# Patient Record
Sex: Female | Born: 1955
Health system: Southern US, Community
[De-identification: ages and names within clinical notes are randomized; demographics above are authoritative.]

## PROBLEM LIST (undated history)

## (undated) DIAGNOSIS — K219 Gastro-esophageal reflux disease without esophagitis: Secondary | ICD-10-CM

## (undated) DIAGNOSIS — J302 Other seasonal allergic rhinitis: Secondary | ICD-10-CM

## (undated) DIAGNOSIS — E042 Nontoxic multinodular goiter: Secondary | ICD-10-CM

## (undated) DIAGNOSIS — F329 Major depressive disorder, single episode, unspecified: Secondary | ICD-10-CM

## (undated) DIAGNOSIS — Z8489 Family history of other specified conditions: Secondary | ICD-10-CM

## (undated) DIAGNOSIS — R42 Dizziness and giddiness: Secondary | ICD-10-CM

## (undated) DIAGNOSIS — I10 Essential (primary) hypertension: Secondary | ICD-10-CM

## (undated) DIAGNOSIS — L509 Urticaria, unspecified: Secondary | ICD-10-CM

## (undated) DIAGNOSIS — F411 Generalized anxiety disorder: Secondary | ICD-10-CM

## (undated) DIAGNOSIS — I209 Angina pectoris, unspecified: Secondary | ICD-10-CM

## (undated) DIAGNOSIS — F32A Depression, unspecified: Secondary | ICD-10-CM

## (undated) HISTORY — DX: Urticaria, unspecified: L50.9

## (undated) HISTORY — DX: Essential (primary) hypertension: I10

## (undated) HISTORY — DX: Nontoxic multinodular goiter: E04.2

## (undated) HISTORY — DX: Generalized anxiety disorder: F41.1

## (undated) HISTORY — PX: APPENDECTOMY: SHX54

## (undated) HISTORY — DX: Gastro-esophageal reflux disease without esophagitis: K21.9

## (undated) HISTORY — DX: Other seasonal allergic rhinitis: J30.2

## (undated) HISTORY — DX: Depression, unspecified: F32.A

---
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
## 1998-05-20 ENCOUNTER — Encounter: Payer: Self-pay | Admitting: Emergency Medicine

## 1998-05-20 ENCOUNTER — Emergency Department (HOSPITAL_COMMUNITY): Admission: EM | Admit: 1998-05-20 | Discharge: 1998-05-20 | Payer: Self-pay | Admitting: Emergency Medicine

## 1999-07-22 ENCOUNTER — Other Ambulatory Visit: Admission: RE | Admit: 1999-07-22 | Discharge: 1999-07-22 | Payer: Self-pay | Admitting: Gynecology

## 2000-02-05 ENCOUNTER — Other Ambulatory Visit: Admission: RE | Admit: 2000-02-05 | Discharge: 2000-02-05 | Payer: Self-pay | Admitting: *Deleted

## 2001-02-23 ENCOUNTER — Other Ambulatory Visit: Admission: RE | Admit: 2001-02-23 | Discharge: 2001-02-23 | Payer: Self-pay | Admitting: *Deleted

## 2002-07-18 ENCOUNTER — Other Ambulatory Visit: Admission: RE | Admit: 2002-07-18 | Discharge: 2002-07-18 | Payer: Self-pay | Admitting: *Deleted

## 2004-04-10 ENCOUNTER — Encounter: Admission: RE | Admit: 2004-04-10 | Discharge: 2004-04-10 | Payer: Self-pay | Admitting: Family Medicine

## 2004-04-10 IMAGING — CT CT HEAD WO/W CM
1 of 2 series · 13 of 30 positions shown, 17 images · IV contrast (75 ML OMNI 300)
Comparison: none

CLINICAL DATA: Headaches. 
 COMPUTERIZED CRANIAL TOMOGRAPHY WITH AND WITHOUT CONTRAST:
 Scans were performed before and after intravenous administration of 75 cc of Omnipaque.  
 There are scattered tiny areas of lucency in the white matter of both cerebral hemispheres.  The brain otherwise appears normal.  No pathologic enhancement after intravenous contrast administration.  Visualized bony structures are within normal limits.

[Series 2: brain · axial · 0.49mm/px · z∈[+34,+155]mm · 13 of 28 slices shown, 17 images]
[im 2/28  brain]
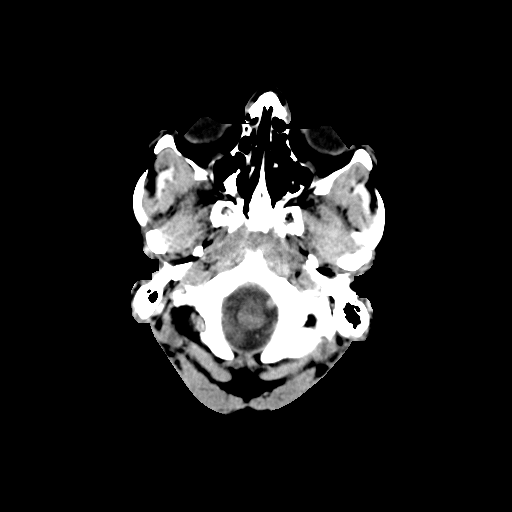
[im 2/28  bone]
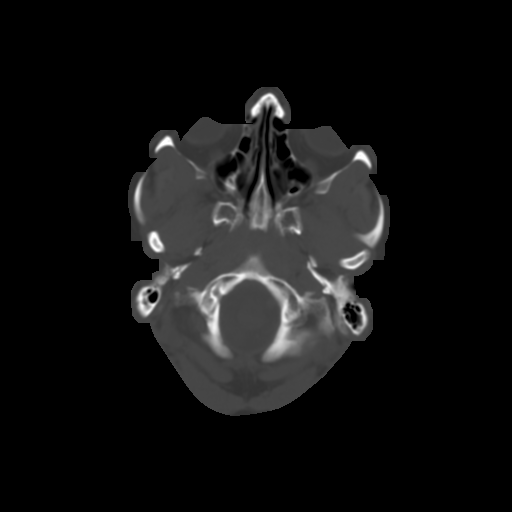
[im 4/28  brain]
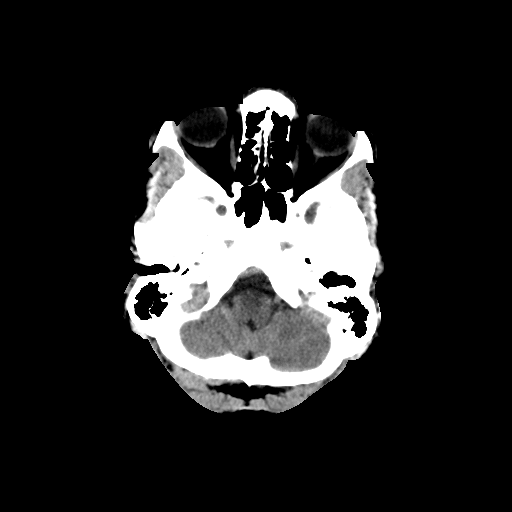
[im 6/28  brain]
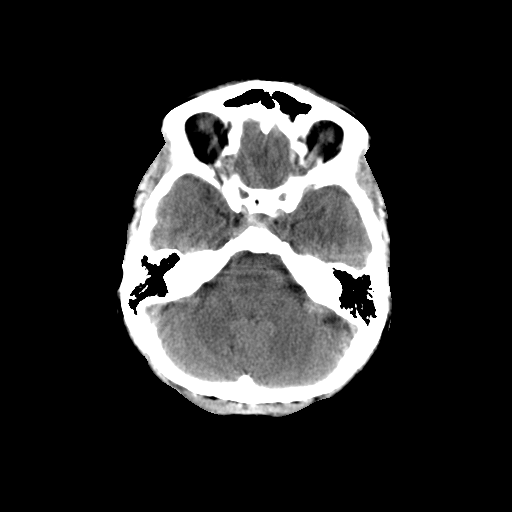
[im 8/28  brain]
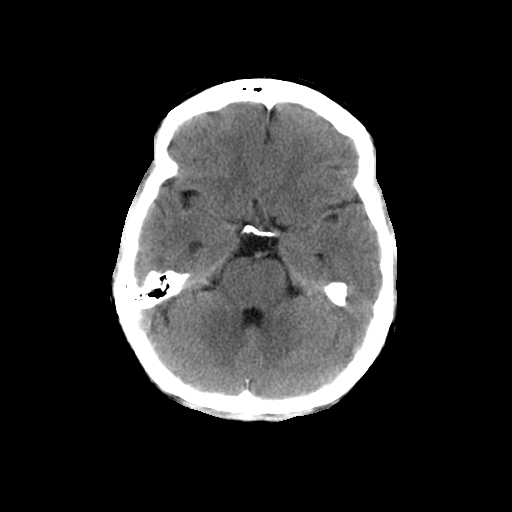
[im 10/28  brain]
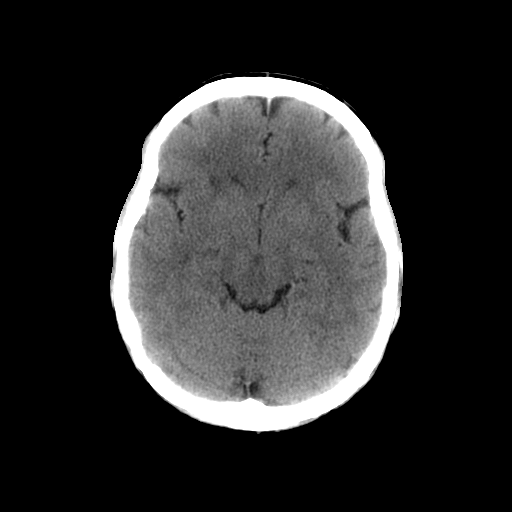
[im 10/28  bone]
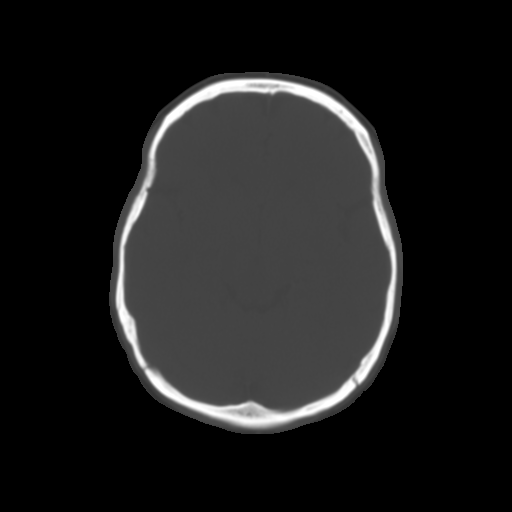
[im 12/28  brain]
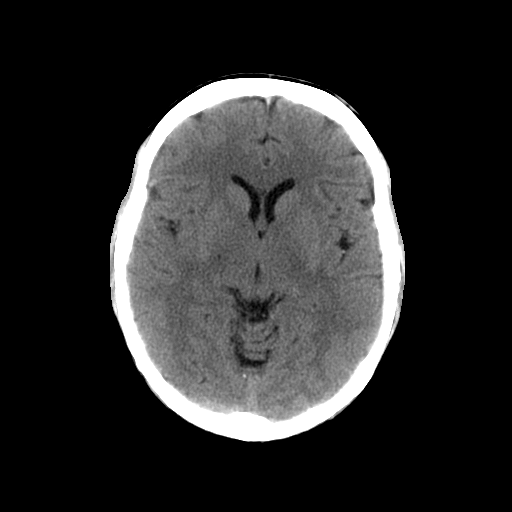
[im 14/28  brain]
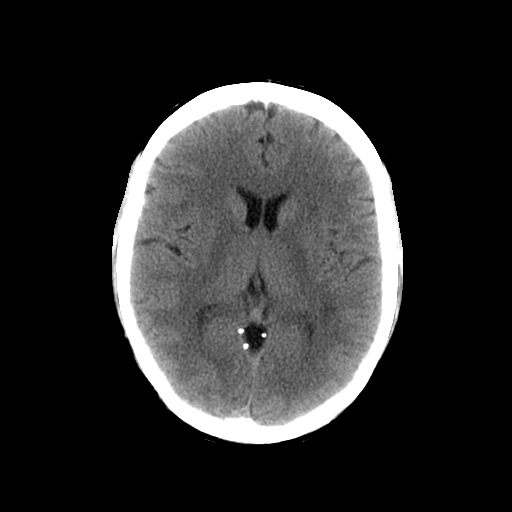
[im 16/28  brain]
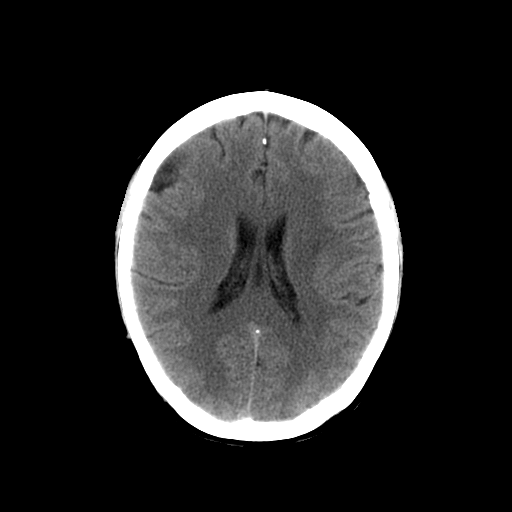
[im 18/28  brain]
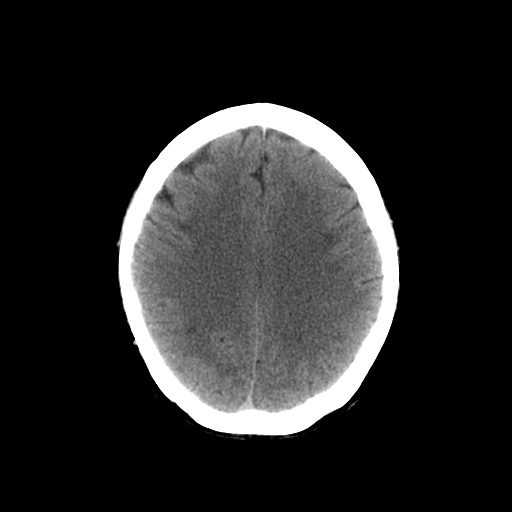
[im 18/28  bone]
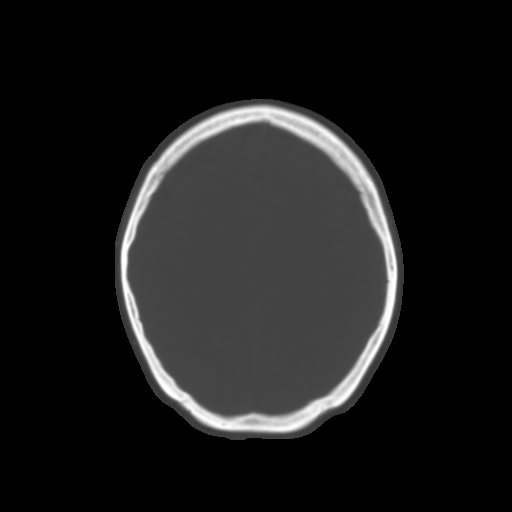
[im 20/28  brain]
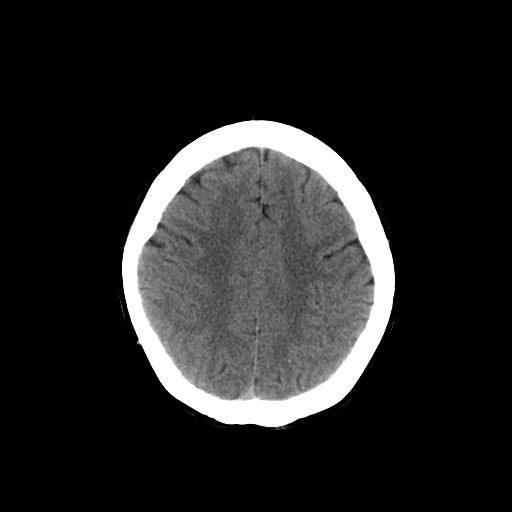
[im 22/28  brain]
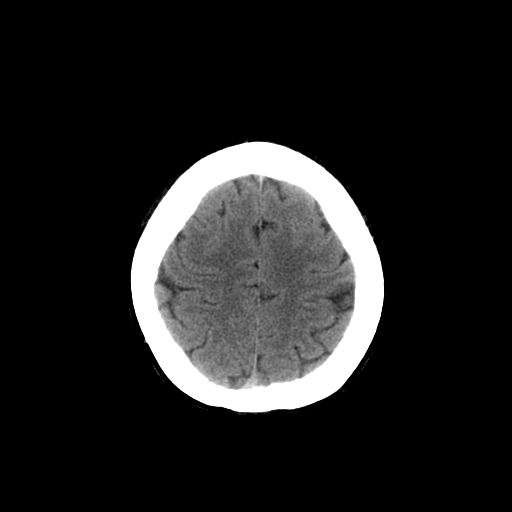
[im 24/28  brain]
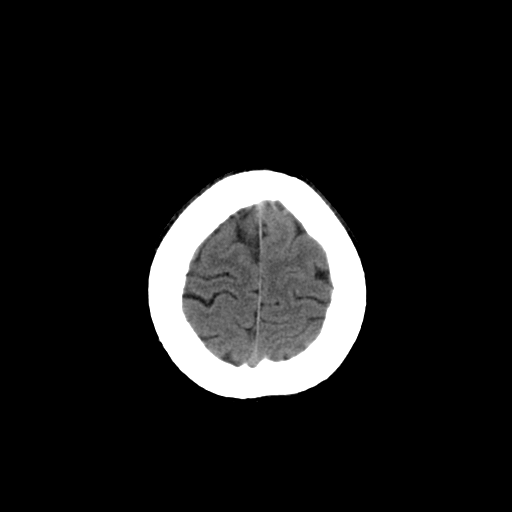
[im 26/28  brain]
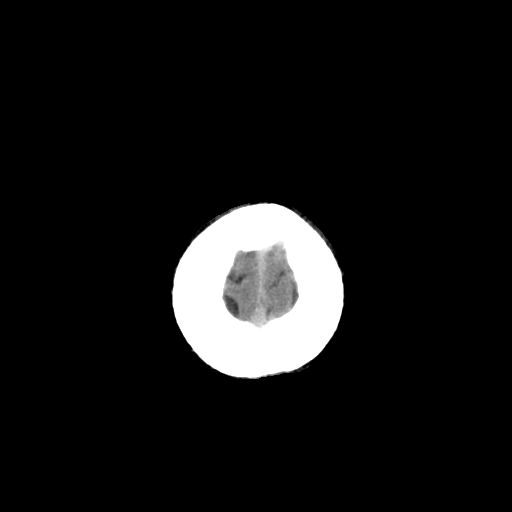
[im 26/28  bone]
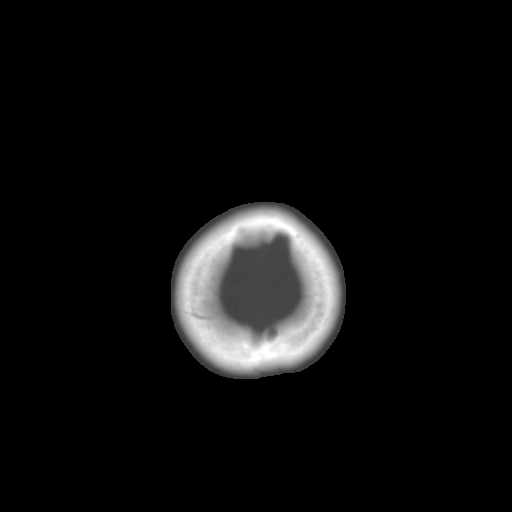

[13 of 30 positions shown; findings below may reference images not displayed]

IMPRESSION: No acute abnormality.  Mild chronic small vessel ischemic changes.

## 2004-06-12 ENCOUNTER — Other Ambulatory Visit: Admission: RE | Admit: 2004-06-12 | Discharge: 2004-06-12 | Payer: Self-pay | Admitting: Obstetrics and Gynecology

## 2005-09-14 LAB — HM DEXA SCAN

## 2005-12-21 ENCOUNTER — Emergency Department (HOSPITAL_COMMUNITY): Admission: EM | Admit: 2005-12-21 | Discharge: 2005-12-21 | Payer: Self-pay | Admitting: Emergency Medicine

## 2006-03-07 ENCOUNTER — Inpatient Hospital Stay (HOSPITAL_COMMUNITY): Admission: EM | Admit: 2006-03-07 | Discharge: 2006-03-08 | Payer: Self-pay | Admitting: *Deleted

## 2006-03-07 IMAGING — CR DG CHEST 2V
2 series · 2 of 2 positions shown · non-contrast
Comparison: none

CLINICAL DATA: 49-year-old with right-sided pain.  Preop evaluation.
 CHEST - 2 VIEW:

[w chest pa]
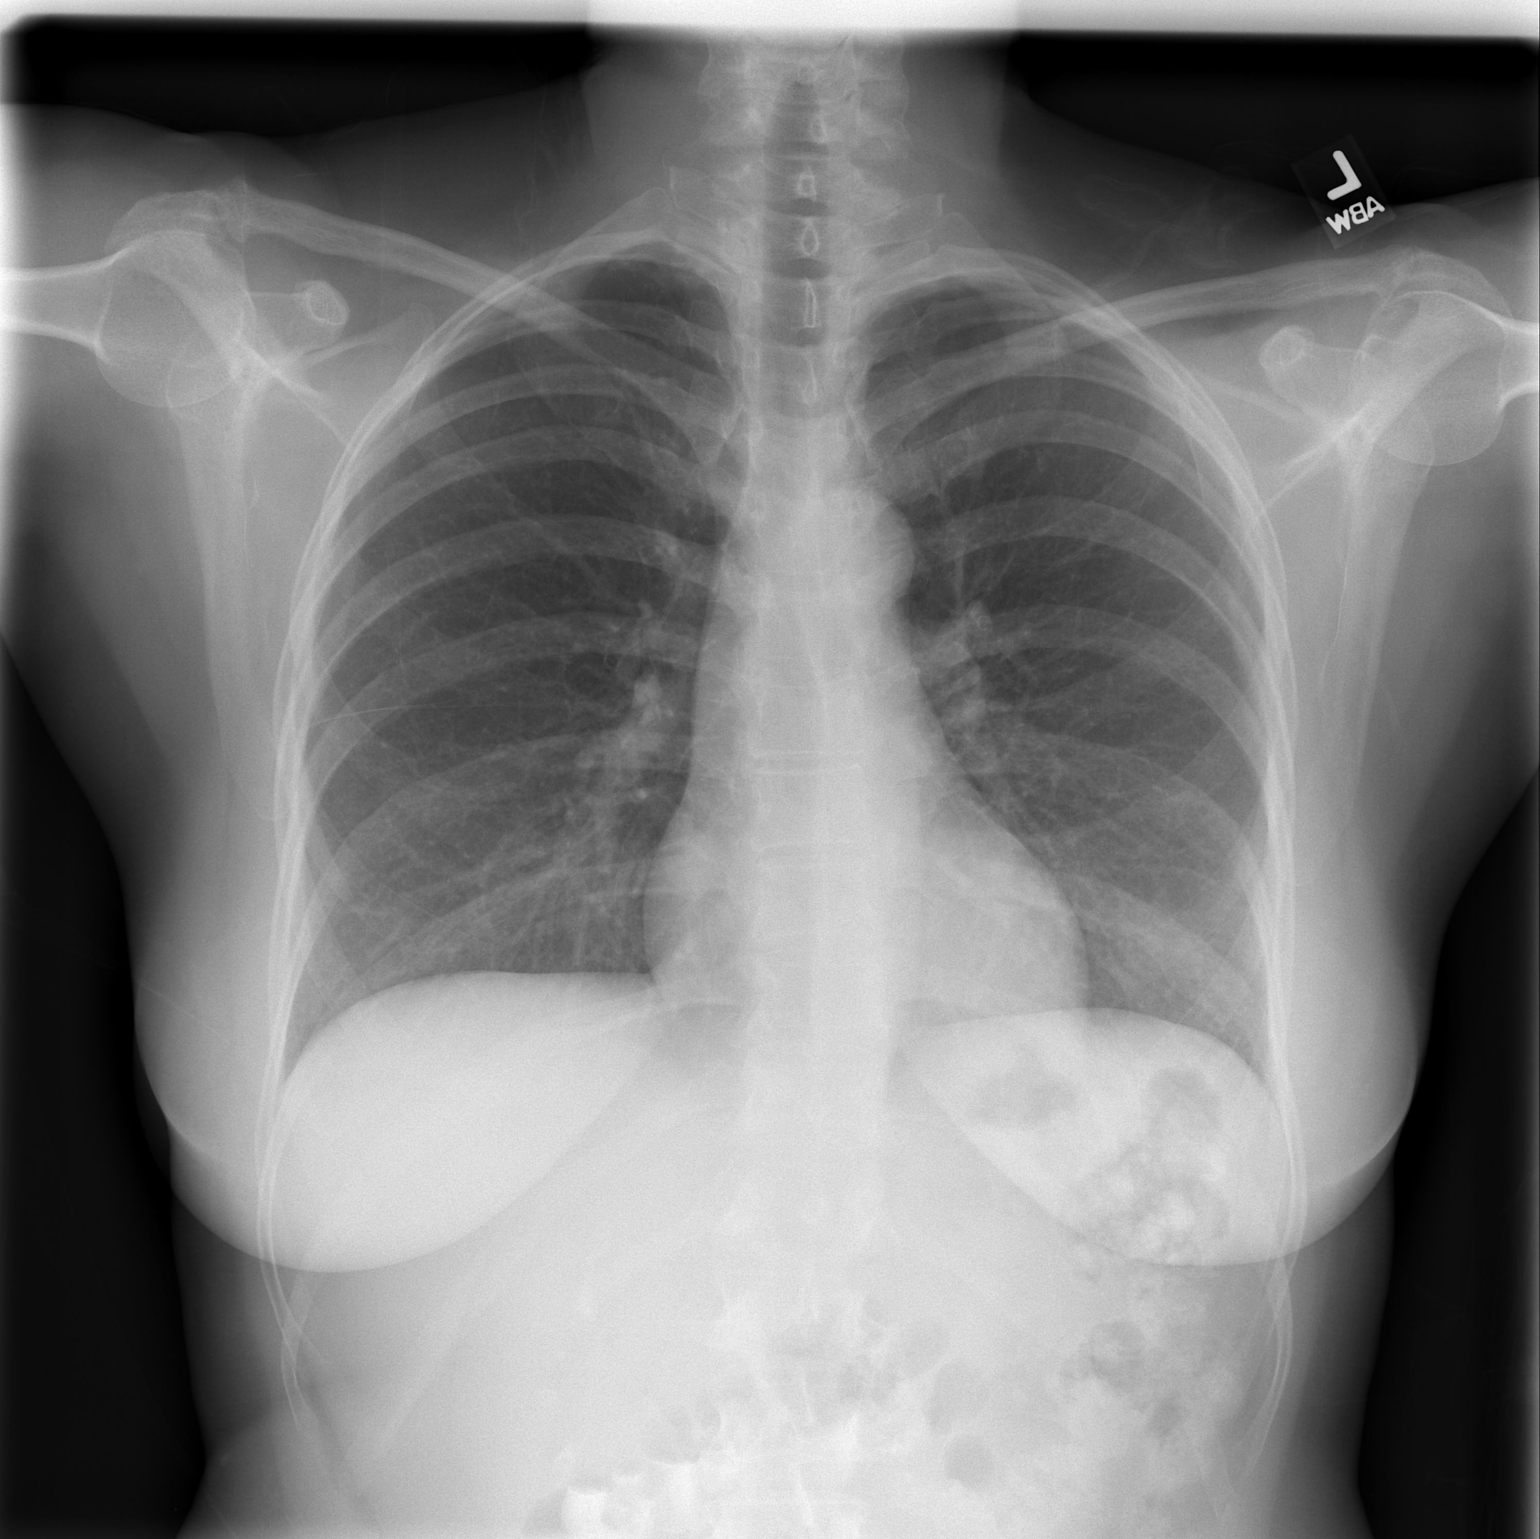

[w chest lat]
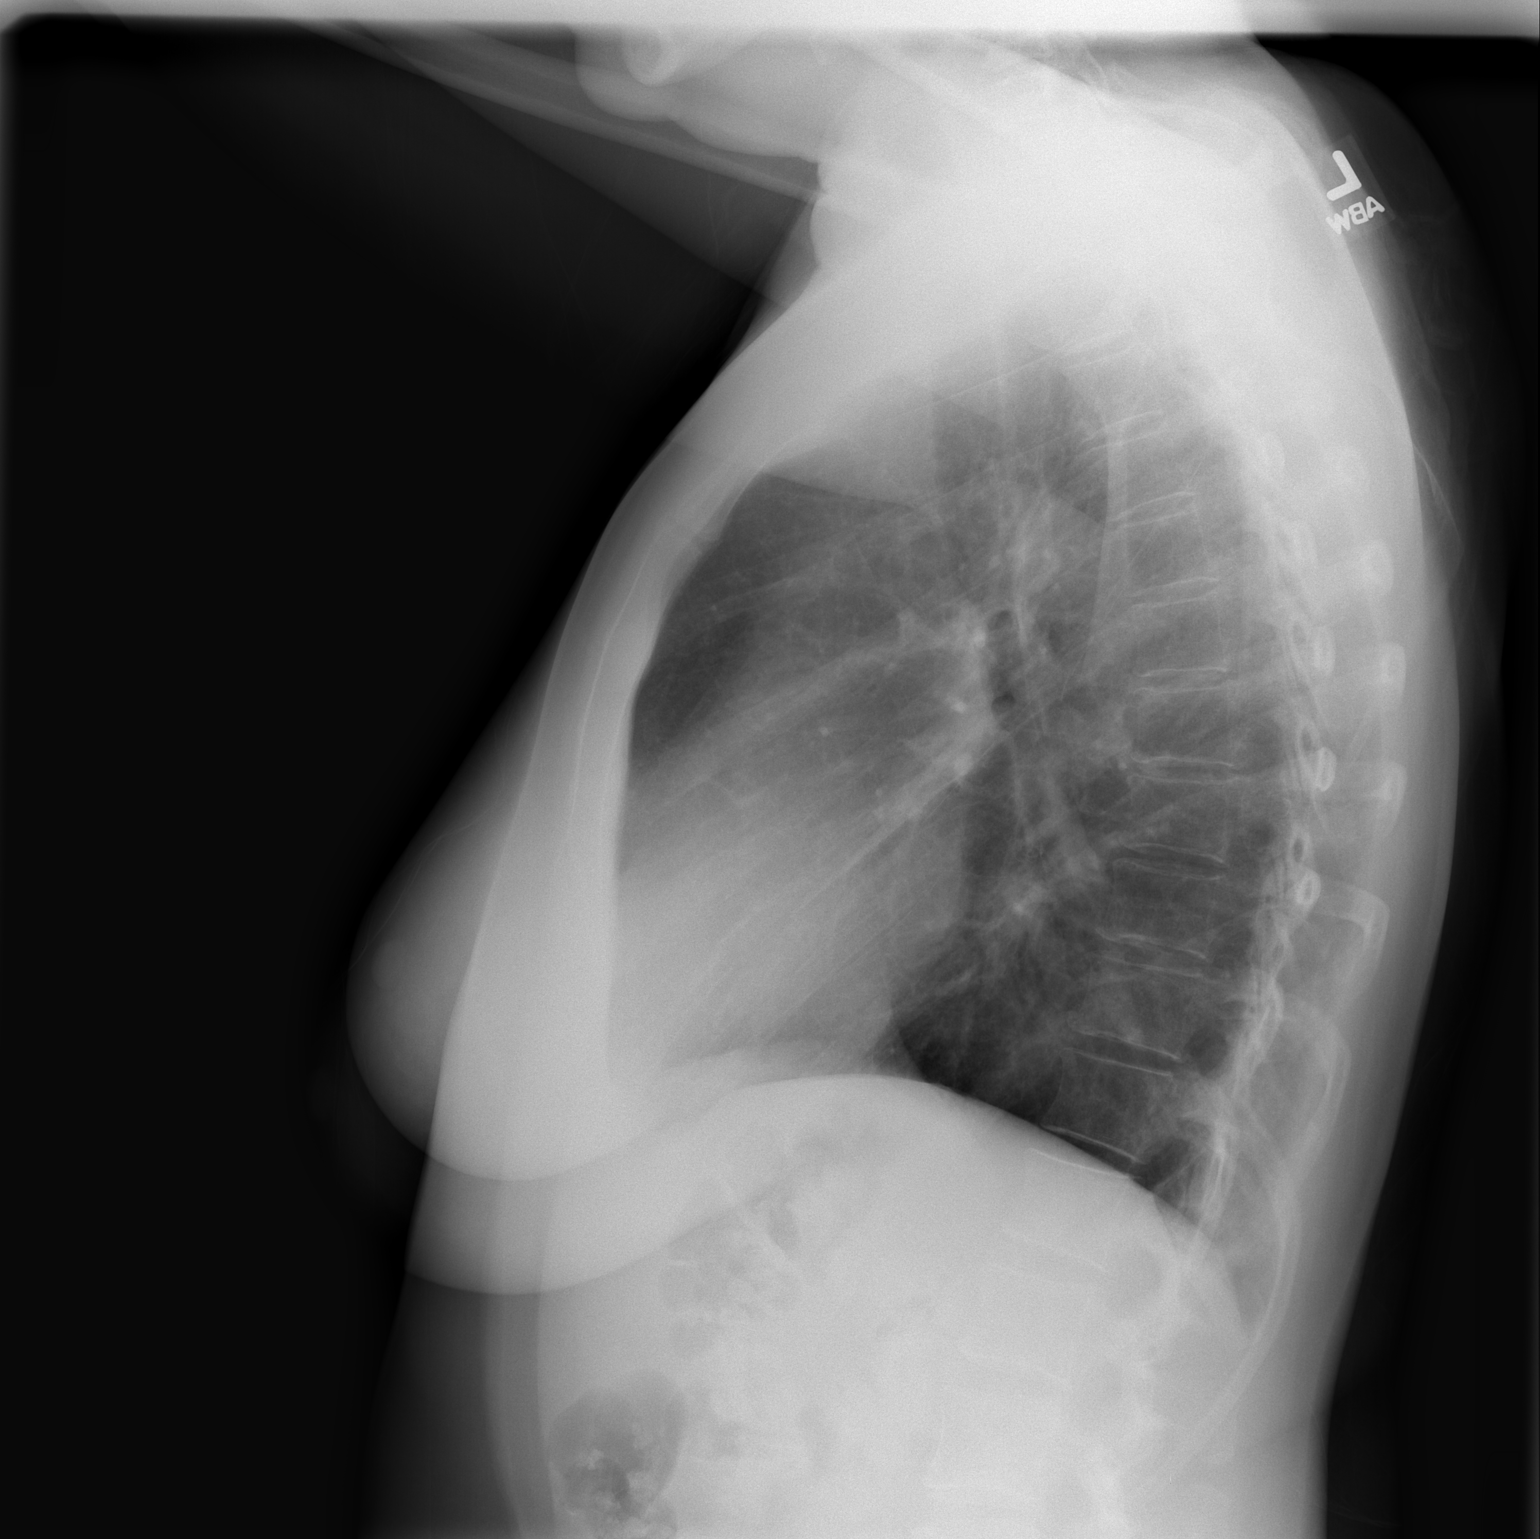

[2 of 2 positions shown; findings below may reference images not displayed]

FINDINGS: Two views of the chest demonstrate clear lungs.  Heart and mediastinum are within normal limits.  Bone structures are intact.  No evidence of pneumothorax.
IMPRESSION: No acute cardiopulmonary disease.

## 2006-03-07 IMAGING — CT CT ABDOMEN W/ CM
2 of 5 series · 16 of 46 positions shown, 18 images · IV contrast (APPLIED)
Comparison: Report from prior CT scan dated [DATE]

ABDOMEN CT WITH CONTRAST

CLINICAL DATA: Right-sided abdominal pain.
TECHNIQUE: Multidetector CT imaging of the abdomen and pelvis was performed
following the standard protocol during bolus administration of intravenous
contrast.

Contrast:  125 cc Omnipaque 300

[Series 2: abd_pel 5.0 b40f st · axial · 0.56mm/px · z∈[-387,-27]mm · 13 of 81 slices shown, 15 images]
[im 5/81  soft-tissue]
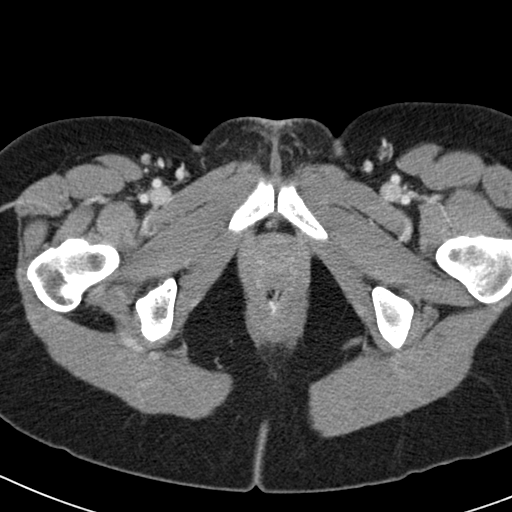
[im 5/81  bone]
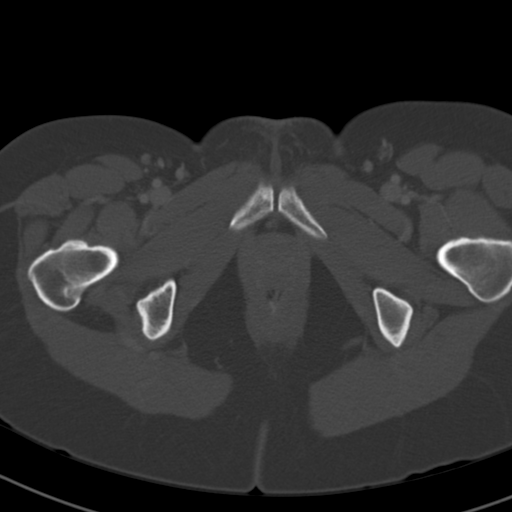
[im 13/81  soft-tissue]
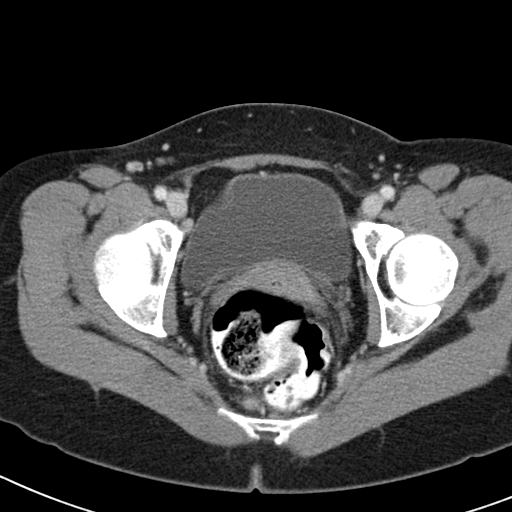
[im 17/81  soft-tissue]
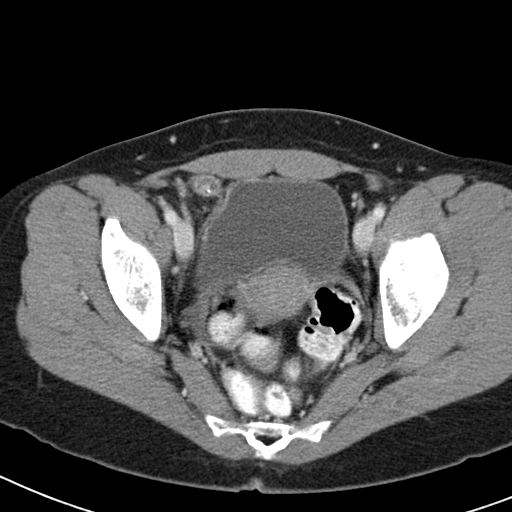
[im 25/81  soft-tissue]
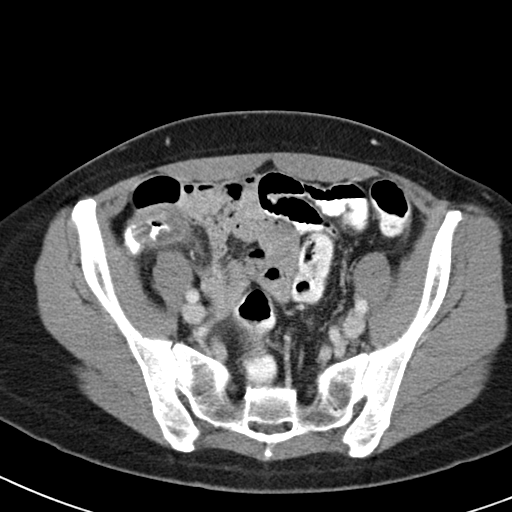
[im 29/81  soft-tissue]
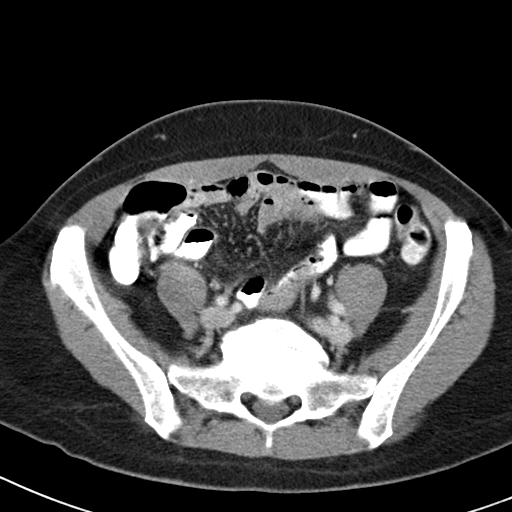
[im 37/81  soft-tissue]
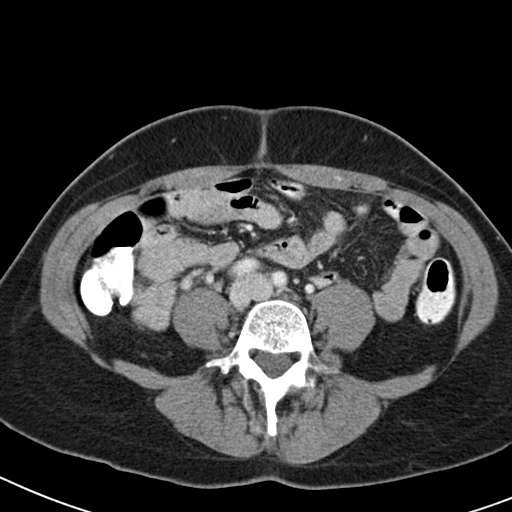
[im 41/81  soft-tissue]
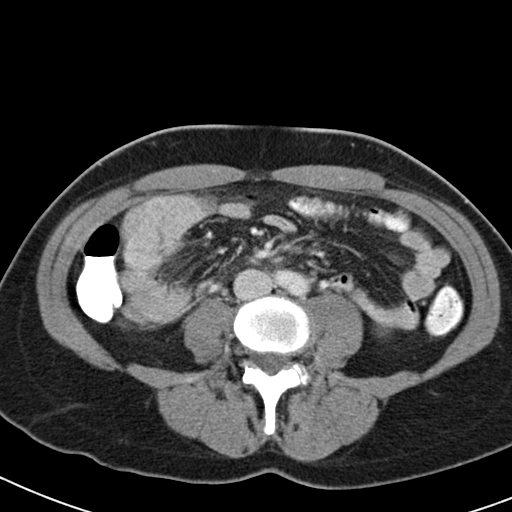
[im 45/81  soft-tissue]
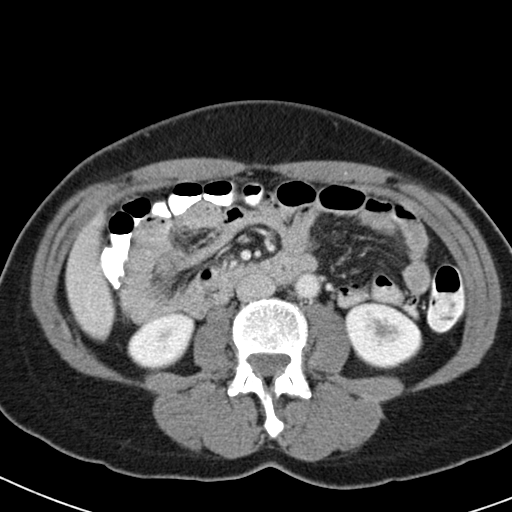
[im 53/81  soft-tissue]
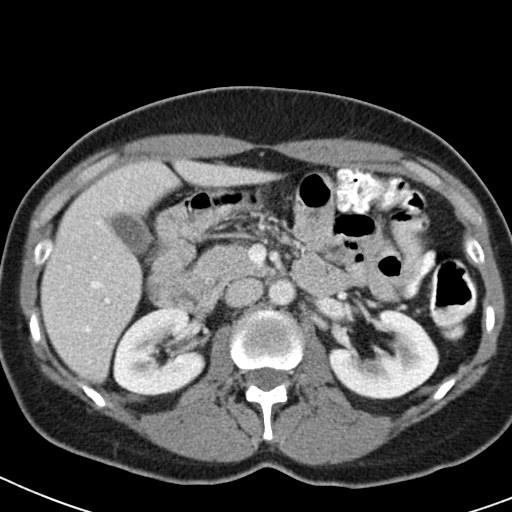
[im 53/81  bone]
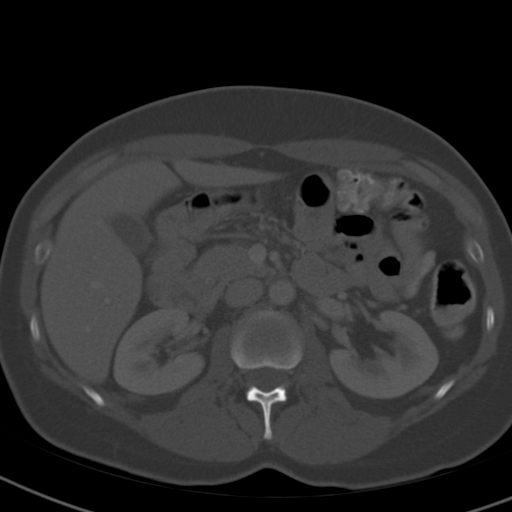
[im 57/81  soft-tissue]
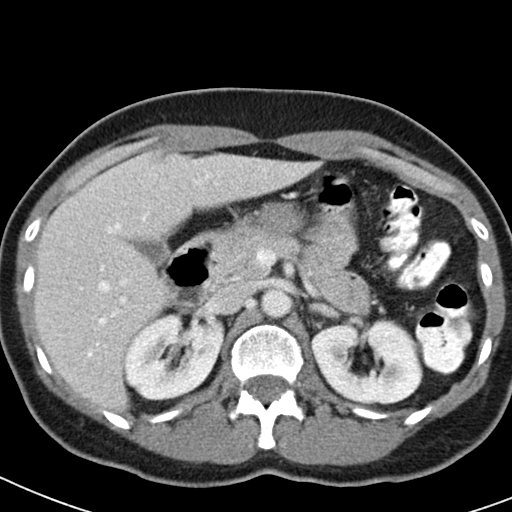
[im 65/81  soft-tissue]
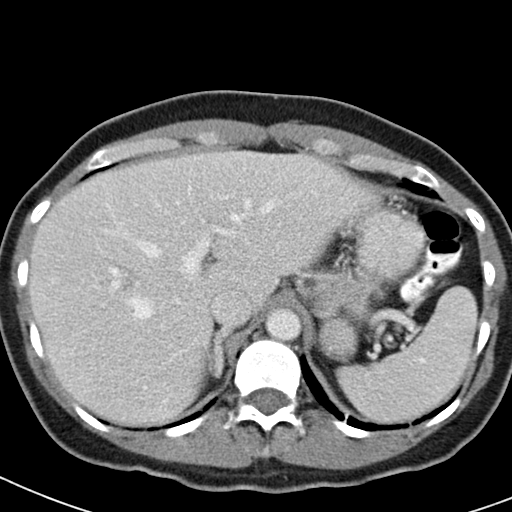
[im 69/81  soft-tissue]
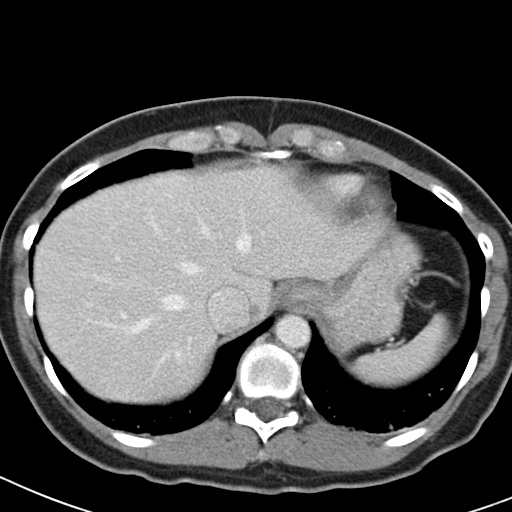
[im 77/81  soft-tissue]
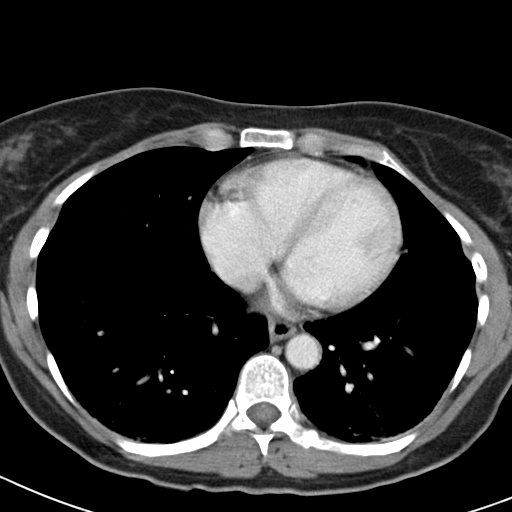

[Series 602: coronal abdomen · coronal · 0.82mm/px · 3 of 111 slices shown]
[im 37/111  soft-tissue]
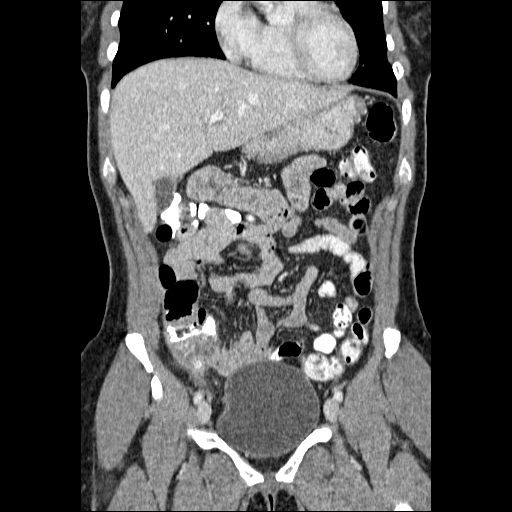
[im 49/111  soft-tissue]
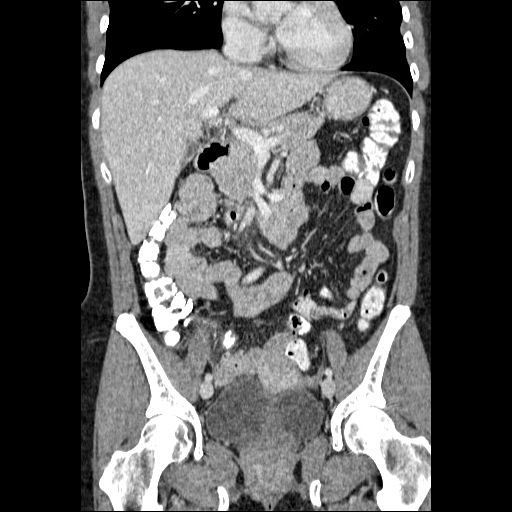
[im 62/111  soft-tissue]
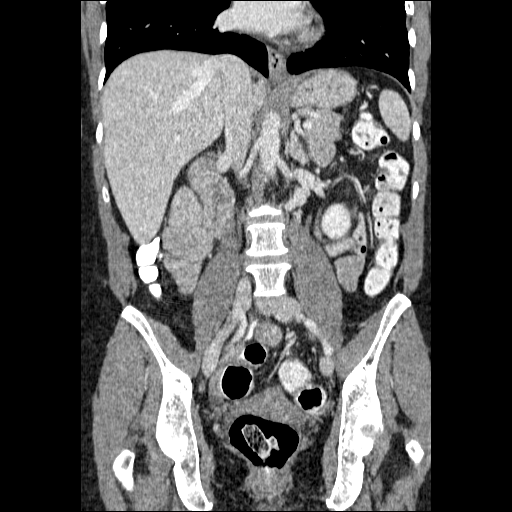

[16 of 46 positions shown; findings below may reference images not displayed]

FINDINGS: Linear subsegmental atelectasis is present in the posterior basal
segments of both lower lobes.

Along the caudate lobe and immediately to left of the inferior vena cava on
image 12 of series 2, a hypodense lesion measuring 6 mm in diameter has an
internal density of -42 Hounsfield units. This is compatible with a small amount
of adipose tissue adjacent to the caudate lobe. This is believed to be
incidental.

Focal fatty infiltration is present adjacent to the falciform ligament within
the liver.

The spleen, adrenal glands, and pancreas appear unremarkable a circumaortic left
renal vein is present. A 5 mm hypodense lesion in the right kidney lower pole on
image 18 of series 5 is technically nonspecific due to its small size, although
statistically likely represent a cyst.

IMPRESSION

1. Mild bibasilar subsegmental atelectasis.
2. 5 mm hypodense lesion in the right kidney lower pole is statistically likely
to represent a cyst, but technically nonspecific due to small size.

PELVIS CT WITH CONTRAST
FINDINGS: The appendix is detected on images 58 through 68 series 2, and is
abnormally thickened with surrounding edema. Appendiceal diameter is 14 mm. The
appearance is compatible with acute appendicitis. No periappendiceal abscess is
identified. No extraluminal gas is identified. No free pelvic fluid noted.

The urinary bladder, ovaries, and uterus appear grossly unremarkable.

There are bilateral pars interarticularis defects at the L5 level, associated
with 10 mm of grade 2 anterior subluxation of L5 on S1.

IMPRESSION

1. Acute appendicitis, without visible periappendiceal abscess. This finding was
discussed with Dr. KIARA at [BI] hours on [DATE] by telephone.
2. Bilateral pars interarticularis defects at the L5 level, with grade 2
anterolisthesis of L5 on S1.

## 2010-06-28 ENCOUNTER — Encounter: Payer: Self-pay | Admitting: Family Medicine

## 2019-01-02 DIAGNOSIS — J302 Other seasonal allergic rhinitis: Secondary | ICD-10-CM | POA: Insufficient documentation

## 2019-01-02 DIAGNOSIS — K219 Gastro-esophageal reflux disease without esophagitis: Secondary | ICD-10-CM | POA: Insufficient documentation

## 2019-01-02 DIAGNOSIS — F32A Depression, unspecified: Secondary | ICD-10-CM | POA: Insufficient documentation

## 2019-01-02 DIAGNOSIS — F419 Anxiety disorder, unspecified: Secondary | ICD-10-CM | POA: Insufficient documentation

## 2019-01-02 DIAGNOSIS — F329 Major depressive disorder, single episode, unspecified: Secondary | ICD-10-CM | POA: Insufficient documentation

## 2019-03-27 ENCOUNTER — Other Ambulatory Visit: Payer: Self-pay

## 2019-03-27 DIAGNOSIS — Z20822 Contact with and (suspected) exposure to covid-19: Secondary | ICD-10-CM

## 2019-03-29 LAB — NOVEL CORONAVIRUS, NAA: SARS-CoV-2, NAA: NOT DETECTED

## 2019-05-02 ENCOUNTER — Other Ambulatory Visit: Payer: Self-pay

## 2019-05-02 DIAGNOSIS — Z20822 Contact with and (suspected) exposure to covid-19: Secondary | ICD-10-CM

## 2019-05-04 LAB — NOVEL CORONAVIRUS, NAA: SARS-CoV-2, NAA: NOT DETECTED

## 2019-06-21 ENCOUNTER — Telehealth: Payer: Self-pay | Admitting: *Deleted

## 2019-06-21 ENCOUNTER — Other Ambulatory Visit: Payer: Self-pay

## 2019-06-21 NOTE — Telephone Encounter (Signed)
Copied from [place] [id number]. Topic: Appointment Scheduling - Scheduling Inquiry for Clinic >> Jun 21, 2019  1:43 PM Lennox Solders wrote: Reason for CRM:pt is calling back to go over PS covid 19. I called the office 3x

## 2019-06-22 ENCOUNTER — Encounter: Payer: Self-pay | Admitting: Internal Medicine

## 2019-06-22 ENCOUNTER — Ambulatory Visit (INDEPENDENT_AMBULATORY_CARE_PROVIDER_SITE_OTHER): Payer: Medicare HMO | Admitting: Internal Medicine

## 2019-06-22 VITALS — BP 110/70 | HR 62 | Temp 97.8°F | Ht 62.0 in | Wt 158.1 lb

## 2019-06-22 DIAGNOSIS — Z1211 Encounter for screening for malignant neoplasm of colon: Secondary | ICD-10-CM | POA: Diagnosis not present

## 2019-06-22 DIAGNOSIS — J309 Allergic rhinitis, unspecified: Secondary | ICD-10-CM | POA: Diagnosis not present

## 2019-06-22 DIAGNOSIS — F329 Major depressive disorder, single episode, unspecified: Secondary | ICD-10-CM | POA: Insufficient documentation

## 2019-06-22 DIAGNOSIS — F32A Depression, unspecified: Secondary | ICD-10-CM | POA: Insufficient documentation

## 2019-06-22 DIAGNOSIS — K219 Gastro-esophageal reflux disease without esophagitis: Secondary | ICD-10-CM

## 2019-06-22 DIAGNOSIS — J302 Other seasonal allergic rhinitis: Secondary | ICD-10-CM

## 2019-06-22 DIAGNOSIS — Z136 Encounter for screening for cardiovascular disorders: Secondary | ICD-10-CM

## 2019-06-22 DIAGNOSIS — F411 Generalized anxiety disorder: Secondary | ICD-10-CM | POA: Diagnosis not present

## 2019-06-22 DIAGNOSIS — I10 Essential (primary) hypertension: Secondary | ICD-10-CM | POA: Insufficient documentation

## 2019-06-22 DIAGNOSIS — Z780 Asymptomatic menopausal state: Secondary | ICD-10-CM

## 2019-06-22 DIAGNOSIS — F331 Major depressive disorder, recurrent, moderate: Secondary | ICD-10-CM | POA: Diagnosis not present

## 2019-06-22 DIAGNOSIS — Z124 Encounter for screening for malignant neoplasm of cervix: Secondary | ICD-10-CM | POA: Diagnosis not present

## 2019-06-22 DIAGNOSIS — E042 Nontoxic multinodular goiter: Secondary | ICD-10-CM | POA: Diagnosis not present

## 2019-06-22 DIAGNOSIS — Z23 Encounter for immunization: Secondary | ICD-10-CM | POA: Diagnosis not present

## 2019-06-22 MED ORDER — FLUTICASONE PROPIONATE 50 MCG/ACT NA SUSP: 2.0000 | Freq: Every day | NASAL | 6 refills | Status: DC

## 2019-06-22 NOTE — Patient Instructions (Signed)
-  Nice seeing you today!!  -Pneumonia vaccine today.  -Schedule follow up in 3 months for your physical and wellness visit.

## 2019-06-22 NOTE — Progress Notes (Signed)
New Patient Office Visit     This visit occurred during the SARS-CoV-2 public health emergency.  Safety protocols were in place, including screening questions prior to the visit, additional usage of staff PPE, and extensive cleaning of exam room while observing appropriate contact time as indicated for disinfecting solutions.    CC/Reason for Visit: Establish care, discuss chronic conditions Previous PCP: In [place] Last Visit: Last year  HPI: Kimberly Conway is a 64 y.o. female who is coming in today for the above mentioned reasons. Past Medical History is significant for: Depression and anxiety followed by psychiatrist Dr. Laverta Baltimore and maintained on Pristiq and Ativan as needed.  She wants information about CBT in the area.  She has a history of GERD and is on omeprazole.  She states that at one point she was diagnosed with H. pylori and was told "she needed an endoscopy to recheck for H. pylori".  She has a history of seasonal allergies and is requesting a refill on Flonase.  She was told at some point that she had multiple thyroid nodules that require follow-up.  I do not have any records for her today.  She also mentions that at one point she went through a gamut of cardiac studies including a stress test and was told "the bottom area of my heart is not right".  She cannot give me any further details but is requesting cardiology follow-up.  She moved here to be close to her sister.  She was a smoker but quit 5 years ago of 1/2 pack a day, she was a heavy drinker but quit 5 years ago, she has no known drug allergies, past surgical history significant for an appendectomy about 20 years ago and a left shoulder rotator cuff repair, family history significant for dad with coronary artery disease, mother and brother with diabetes and a sister with hypertension.   Past Medical/Surgical History: Past Medical History:  Diagnosis Date  . Depression   . GAD (generalized anxiety disorder)   . GERD  (gastroesophageal reflux disease)   . HTN (hypertension)   . Multiple thyroid nodules   . Seasonal allergies     Past Surgical History:  Procedure Laterality Date  . APPENDECTOMY      Social History:  reports that she quit smoking about 5 years ago. She has never used smokeless tobacco. She reports previous alcohol use. She reports that she does not use drugs.  Allergies: No Known Allergies  Family History:  Family History  Problem Relation Age of Onset  . Diabetes Mother   . CAD Father   . Hypertension Sister   . Diabetes Brother      Current Outpatient Medications:  .  aspirin 81 MG EC tablet, Take 81 mg by mouth at bedtime., Disp: , Rfl:  .  b complex vitamins tablet, Take 1 tablet by mouth daily., Disp: , Rfl:  .  Calcium 500-125 MG-UNIT TABS, Take by mouth., Disp: , Rfl:  .  Calcium Citrate (CITRACAL PO), Take 1,200 mg by mouth., Disp: , Rfl:  .  desvenlafaxine (PRISTIQ) 50 MG 24 hr tablet, Take 50 mg by mouth daily., Disp: , Rfl:  .  LORazepam (ATIVAN) 0.5 MG tablet, Take 0.5 mg by mouth daily as needed., Disp: , Rfl:  .  metoprolol succinate (TOPROL-XL) 25 MG 24 hr tablet, Take 25 mg by mouth. Take half tablet daily, Disp: , Rfl:  .  Multiple Vitamin (MULTIVITAMIN) tablet, Take 1 tablet by mouth daily., Disp: , Rfl:  .  omega-3 fish oil (MAXEPA) 1000 MG CAPS capsule, Take by mouth., Disp: , Rfl:  .  omeprazole (PRILOSEC) 20 MG capsule, Take 20 mg by mouth daily., Disp: , Rfl:  .  OVER THE COUNTER MEDICATION, Glucosamine 1500 mg  Chon 1200 mg, Disp: , Rfl:  .  Probiotic Product (PROBIOTIC-10 PO), Take by mouth., Disp: , Rfl:  .  Turmeric (QC TUMERIC COMPLEX PO), Take by mouth., Disp: , Rfl:  .  fluticasone (FLONASE) 50 MCG/ACT nasal spray, Place 2 sprays into both nostrils daily., Disp: 16 g, Rfl: 6  Review of Systems:  Constitutional: Denies fever, chills, diaphoresis, appetite change and fatigue.  HEENT: Denies photophobia, eye pain, redness, hearing loss, ear  pain, congestion, sore throat, rhinorrhea, sneezing, mouth sores, trouble swallowing, neck pain, neck stiffness and tinnitus.   Respiratory: Denies SOB, DOE, cough, chest tightness,  and wheezing.   Cardiovascular: Denies chest pain, palpitations and leg swelling.  Gastrointestinal: Denies nausea, vomiting, abdominal pain, diarrhea, constipation, blood in stool and abdominal distention.  Genitourinary: Denies dysuria, urgency, frequency, hematuria, flank pain and difficulty urinating.  Endocrine: Denies: hot or cold intolerance, sweats, changes in hair or nails, polyuria, polydipsia. Musculoskeletal: Denies myalgias, back pain, joint swelling, arthralgias and gait problem.  Skin: Denies pallor, rash and wound.  Neurological: Denies dizziness, seizures, syncope, weakness, light-headedness, numbness and headaches.  Hematological: Denies adenopathy. Easy bruising, personal or family bleeding history  Psychiatric/Behavioral: Denies suicidal ideation, mood changes, confusion, nervousness, sleep disturbance and agitation    Physical Exam: Vitals:   06/22/19 1107  BP: 110/70  Pulse: 62  Temp: 97.8 F (36.6 C)  TempSrc: Temporal  SpO2: 99%  Weight: 158 lb 1.6 oz (71.7 kg)  Height: 5\' 2"  (1.575 m)   Body mass index is 28.92 kg/m.  Constitutional: NAD, calm, comfortable Eyes: PERRL, lids and conjunctivae normal ENMT: Mucous membranes are moist.  Respiratory: clear to auscultation bilaterally, no wheezing, no crackles. Normal respiratory effort. No accessory muscle use.  Cardiovascular: Regular rate and rhythm, no murmurs / rubs / gallops. No extremity edema.  Abdomen: no tenderness, no masses palpated. No hepatosplenomegaly. Bowel sounds positive.  Neurologic: Grossly intact and nonfocal Psychiatric: Normal judgment and insight. Alert and oriented x 3. Normal mood.    Impression and Plan:  Screening for cervical cancer  - Plan: Ambulatory referral to Gynecology  Screening for  malignant neoplasm of colon  - Plan: Ambulatory referral to Gastroenterology  Multiple thyroid nodules  - Plan: US THYROID  Allergic rhinitis, unspecified seasonality, unspecified trigger  - Plan: fluticasone (FLONASE) 50 MCG/ACT nasal spray  Screening for heart disease  - Plan: Ambulatory referral to Cardiology -Again this referral is made at patient's request, I do not have records but she was told she needed a local cardiologist.  We will attempt to obtain records today.  Moderate episode of recurrent major depressive disorder (HCC) GAD (generalized anxiety disorder) -On Pristiq and lorazepam followed by local psychiatry.  Gastroesophageal reflux disease without esophagitis -Well-controlled on PPI therapy, is requesting referral to GI, I am not convinced that she needs repeat endoscopy to rule out reinfection with H. pylori.  It is important to note that she does not have symptoms.  Essential hypertension -Well-controlled on current regimen.  Seasonal allergies -Flonase.     Patient Instructions  -Nice seeing you today!!  -Pneumonia vaccine today.  -Schedule follow up in 3 months for your physical and wellness visit.     Lelon Frohlich, MD  Primary Care at [facility]

## 2019-06-22 NOTE — Addendum Note (Signed)
Addended by: Westley Hummer B on: 06/22/2019 03:49 PM   Modules accepted: Orders

## 2019-07-03 ENCOUNTER — Encounter: Payer: Self-pay | Admitting: General Practice

## 2019-07-07 ENCOUNTER — Ambulatory Visit: Payer: Medicare HMO | Attending: Internal Medicine

## 2019-07-07 DIAGNOSIS — Z20822 Contact with and (suspected) exposure to covid-19: Secondary | ICD-10-CM

## 2019-07-08 LAB — NOVEL CORONAVIRUS, NAA: SARS-CoV-2, NAA: NOT DETECTED

## 2019-07-12 ENCOUNTER — Ambulatory Visit
Admission: RE | Admit: 2019-07-12 | Discharge: 2019-07-12 | Disposition: A | Discharge status: Home / Self Care | Payer: Medicare HMO | Source: Ambulatory Visit | Attending: Internal Medicine | Admitting: Internal Medicine

## 2019-07-12 DIAGNOSIS — E042 Nontoxic multinodular goiter: Secondary | ICD-10-CM

## 2019-07-12 DIAGNOSIS — E041 Nontoxic single thyroid nodule: Secondary | ICD-10-CM | POA: Diagnosis not present

## 2019-07-12 IMAGING — US US THYROID
1 series · 13 of 25 positions shown · non-contrast
Comparison: None.

CLINICAL DATA: Palpable abnormality.  Palpable thyroid nodule

EXAM:
THYROID ULTRASOUND
TECHNIQUE: Ultrasound examination of the thyroid gland and adjacent soft
tissues was performed.

[Series 1: us thyroid · 0.05mm/px · 13 of 41 slices shown]
[im 1/41]
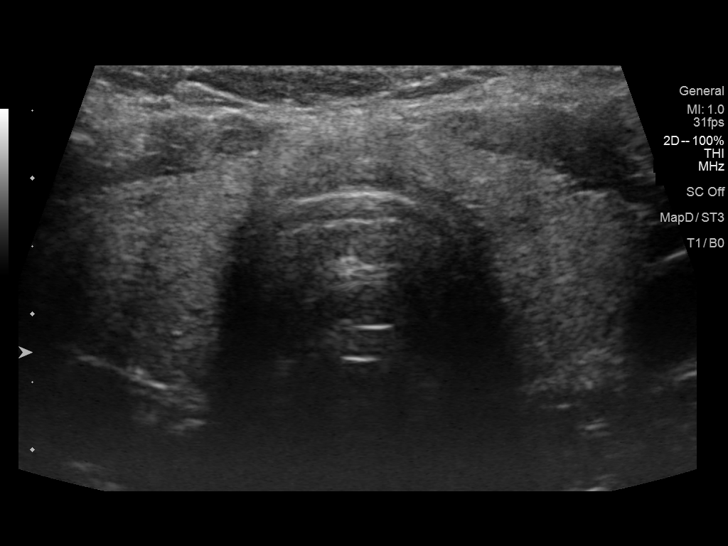
[im 4/41]
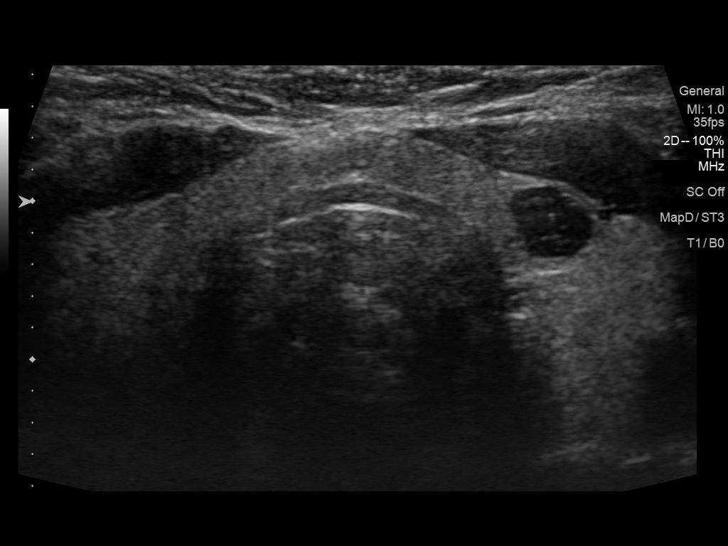
[im 7/41]
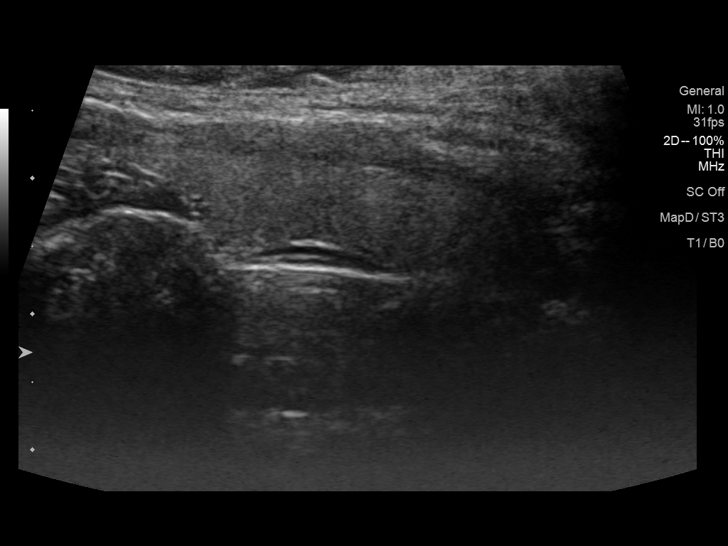
[im 11/41]
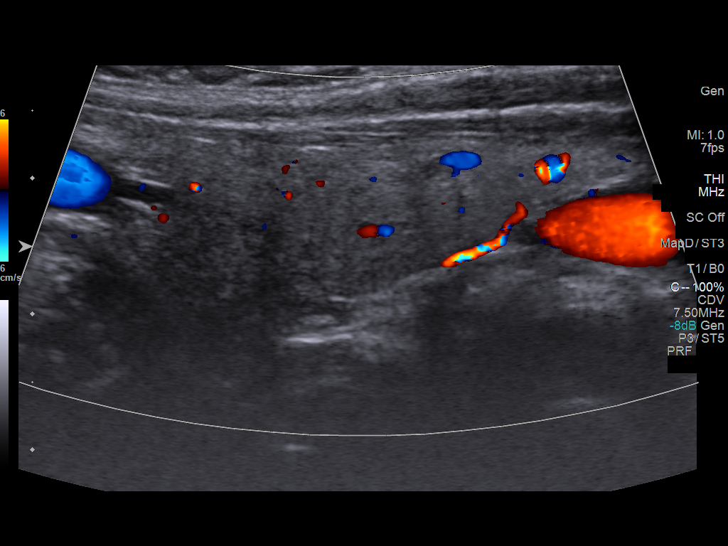
[im 14/41]
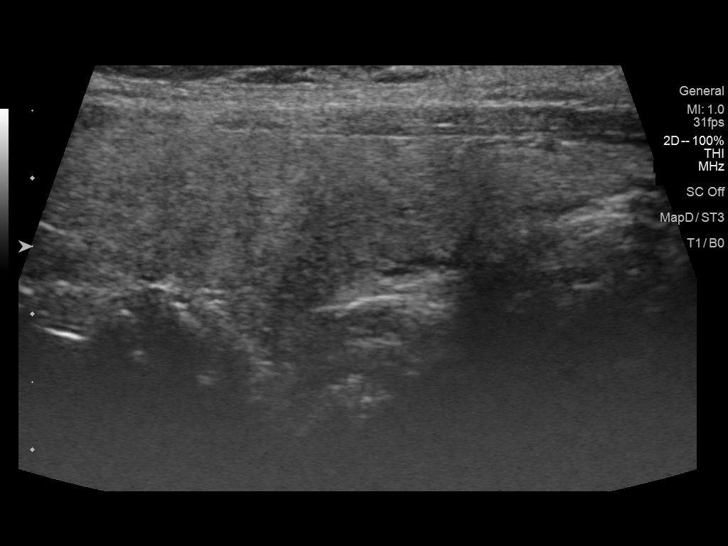
[im 17/41]
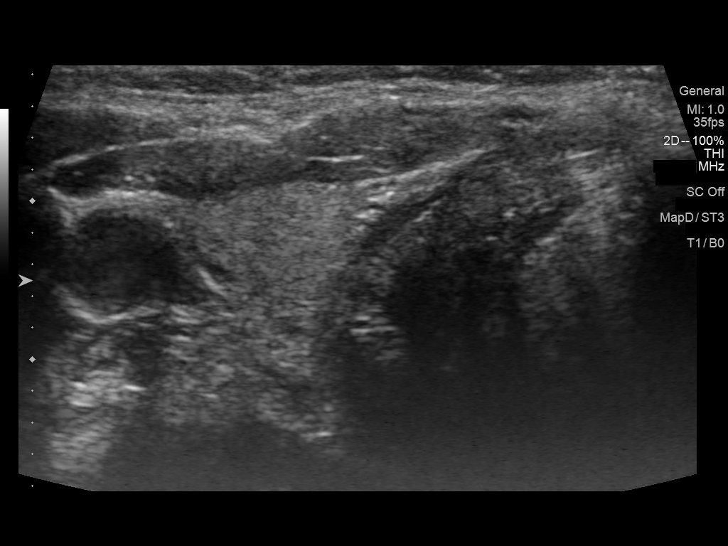
[im 21/41]
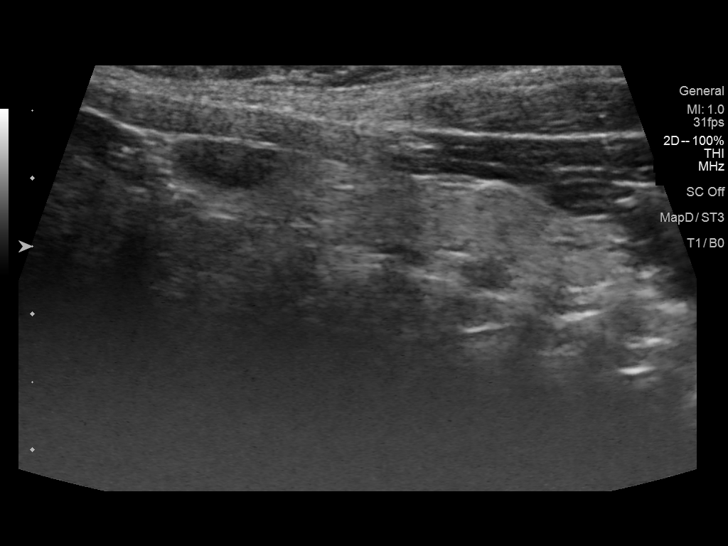
[im 24/41]
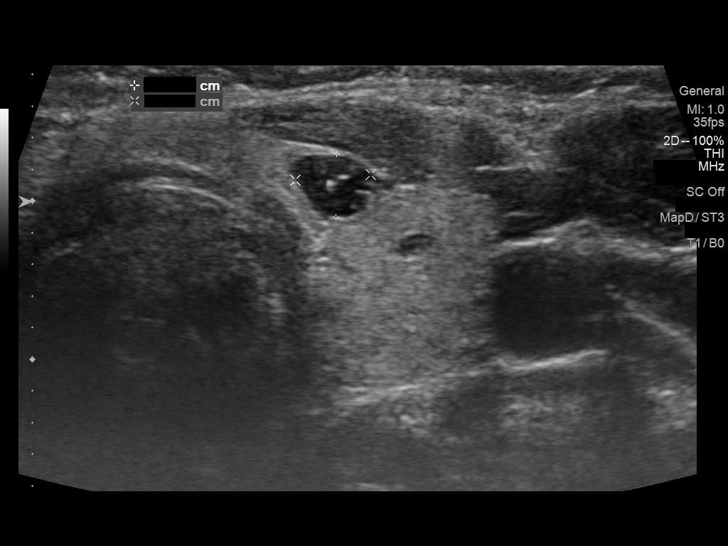
[im 27/41]
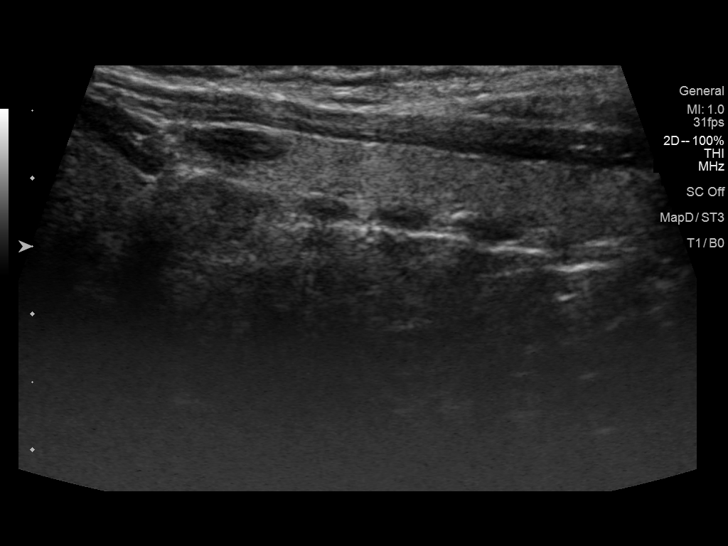
[im 31/41]
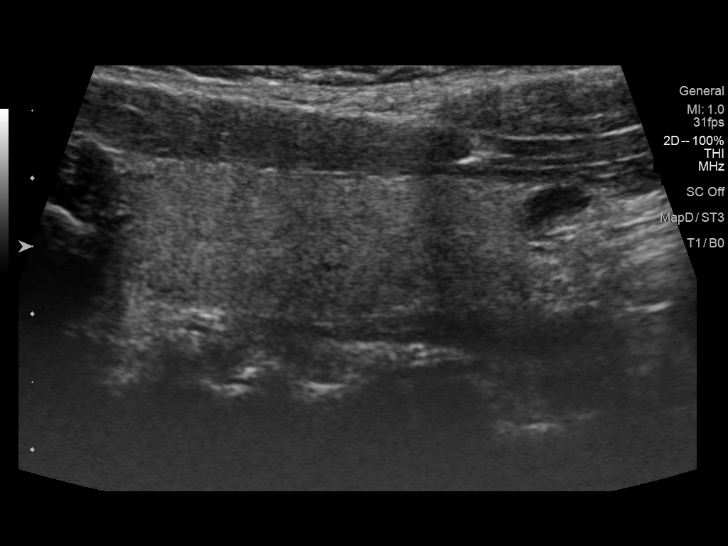
[im 34/41]
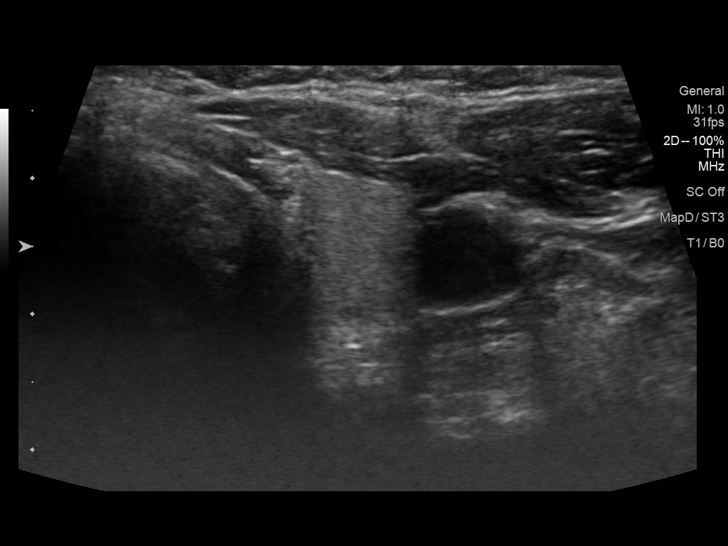
[im 37/41]
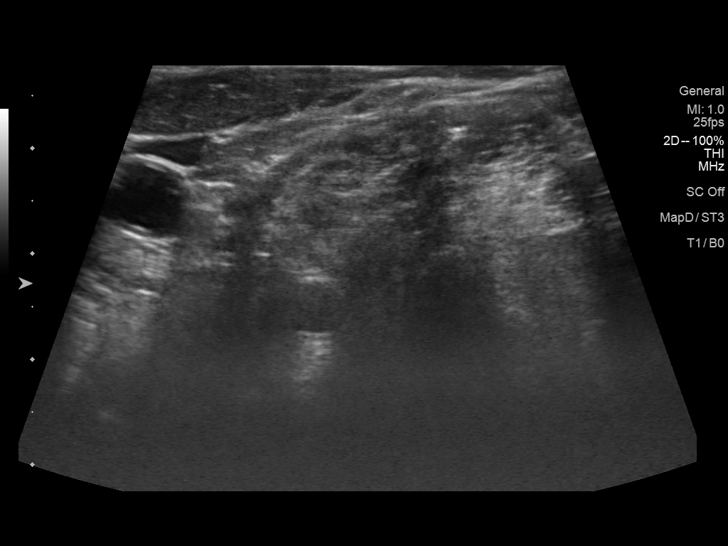
[im 41/41]
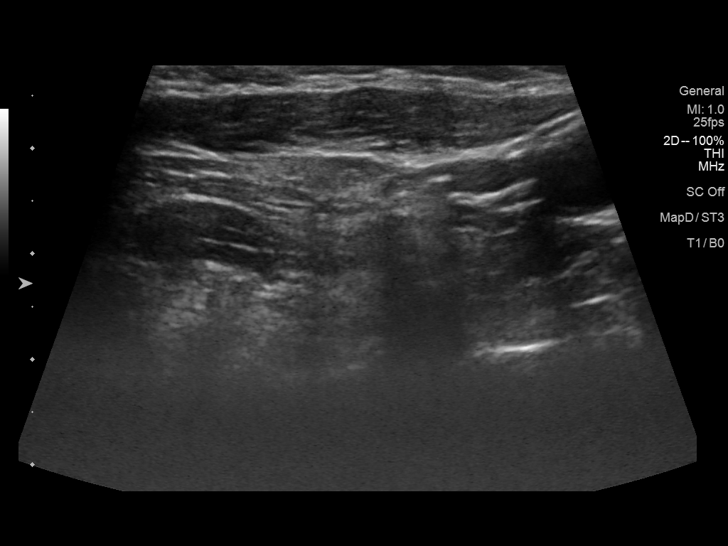

[13 of 25 positions shown; findings below may reference images not displayed]

FINDINGS: Parenchymal Echotexture: Normal

Isthmus: Normal in size measures 0.3 cm in diameter

Right lobe: Normal in size measuring 4.4 x 1.3 x 1.9 cm

Left lobe: Normal in size measuring 4.2 x 1.3 x 1.2 cm

_________________________________________________________

Estimated total number of nodules >/= 1 cm: 0

Number of spongiform nodules >/=  2 cm not described below (TR1): 0

Number of mixed cystic and solid nodules >/= 1.5 cm not described
below (TR2): 0

_________________________________________________________

There is an approximately 0.7 x 0.5 x 0.4 cm anechoic cyst within
the superomedial aspect the left lobe of the thyroid which is noted
to contain several internal echogenic foci ring down artifact
compatible with benign colloid. This benign colloid containing cyst
does not meet criteria to recommend percutaneous sampling or
continued dedicated follow-up.
IMPRESSION: 1. Solitary punctate (approximately 0.7 cm) colloid containing cyst
within the left lobe of the thyroid does not meet imaging criteria
to recommend percutaneous sampling or continued dedicated follow-up.
2. Otherwise, normal thyroid ultrasound.

The above is in keeping with the ACR TI-RADS recommendations - [HOSPITAL] [MI];[DATE].

## 2019-07-20 ENCOUNTER — Ambulatory Visit: Payer: Medicare HMO | Admitting: Gastroenterology

## 2019-07-20 ENCOUNTER — Encounter: Payer: Self-pay | Admitting: Gastroenterology

## 2019-07-20 VITALS — BP 108/76 | HR 80 | Temp 97.6°F | Ht 62.0 in | Wt 161.4 lb

## 2019-07-20 DIAGNOSIS — R1013 Epigastric pain: Secondary | ICD-10-CM | POA: Diagnosis not present

## 2019-07-20 DIAGNOSIS — Z01818 Encounter for other preprocedural examination: Secondary | ICD-10-CM

## 2019-07-20 DIAGNOSIS — Z8619 Personal history of other infectious and parasitic diseases: Secondary | ICD-10-CM

## 2019-07-20 DIAGNOSIS — Z1211 Encounter for screening for malignant neoplasm of colon: Secondary | ICD-10-CM

## 2019-07-20 MED ORDER — SUTAB 1479-225-188 MG PO TABS: 1.0000 | ORAL_TABLET | ORAL | 0 refills | Status: DC

## 2019-07-20 MED ORDER — NA SULFATE-K SULFATE-MG SULF 17.5-3.13-1.6 GM/177ML PO SOLN: 1.0000 | ORAL | 0 refills | Status: AC

## 2019-07-20 NOTE — Progress Notes (Signed)
Referring Provider: Isaac Bliss, [facility]* Primary Care Physician:  Isaac Bliss, Rayford Halsted, MD  Reason for Consultation:  Reflux, need for colon cancer screening   IMPRESSION:  Reflux    - UGI series in [place] shows reflux    - symptoms may be worsened by tumeric Odynophagia/chest pain H pylori + while in [place] No prior colon cancer screening No known family history of colon cancer or polyps  PLAN: Obtain records from prior gastroenterologist in [place], [place] I wonder if tumeric might be related Continue omeprazole 20 mg daily EGD with esophageal and gastric biopsies Colonoscopy  Please see the "Patient Instructions" section for addition details about the plan.  HPI: Kimberly Conway is a 63 y.o. female referred by Dr. Isaac Bliss for reflux and screening colonoscopy.  Returned to [place] in September after living in [place] for several years to be closer to her sister. 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 She has depression and anxiety.  Has had reflux x 2 years with associate eructation despite omeprazole.  Odynophagia/chest pain with hot liquids. No dysphagia.  H pylori breath test + prior to moving. Was told she needed an  EGD for diagnosis.  Allergies worse since she moved to [place]. No neck pain. No dysphonia.  Using Probiotic from [employer].  No dysphagia, cough, sore throat, neck pain.  Labs from 01/25/2018 show a positive H. pylori antibody Upper GI series 05/10/2015 for reflux and chest pain showed mild gastroesophageal reflux to the level of the distal esophagus.  The exam was otherwise normal.  No prior colonoscopy or colon cancer screening.   No known family history of colon cancer or polyps. No family history of uterine/endometrial cancer, pancreatic cancer or gastric/stomach cancer.   Past Medical History:  Diagnosis Date  . Depression   . GAD (generalized anxiety disorder)   . GERD (gastroesophageal reflux disease)   . HTN (hypertension)     . Multiple thyroid nodules   . Seasonal allergies     Past Surgical History:  Procedure Laterality Date  . APPENDECTOMY      Current Outpatient Medications  Medication Sig Dispense Refill  . b complex vitamins tablet Take 1 tablet by mouth daily.    . Calcium 500-125 MG-UNIT TABS Take by mouth.    . Calcium Citrate (CITRACAL PO) Take 1,200 mg by mouth 3 (three) times daily.     [name] Kitchen desvenlafaxine (PRISTIQ) 50 MG 24 hr tablet Take 50 mg by mouth daily.    . fluticasone (FLONASE) 50 MCG/ACT nasal spray Place 2 sprays into both nostrils daily. 16 g 6  . LORazepam (ATIVAN) 0.5 MG tablet Take 0.5 mg by mouth daily as needed.    . metoprolol succinate (TOPROL-XL) 25 MG 24 hr tablet Take 25 mg by mouth. Take half tablet daily    . Multiple Vitamin (MULTIVITAMIN) tablet Take 1 tablet by mouth daily.    [name] Kitchen omega-3 fish oil (MAXEPA) 1000 MG CAPS capsule Take 1 capsule by mouth daily.     [name] Kitchen omeprazole (PRILOSEC) 20 MG capsule Take 20 mg by mouth daily.    [name] Kitchen OVER THE COUNTER MEDICATION Glucosamine 1500 mg  Chon 1200 mg    . Probiotic Product (PROBIOTIC-10 PO) Take by mouth.    . Turmeric (QC TUMERIC COMPLEX PO) Take 1 tablet by mouth daily.      No current facility-administered medications for this visit.    Allergies as of 07/20/2019  . (No Known Allergies)    Family History  Problem Relation Age of Onset  . Diabetes Mother   . Breast cancer Mother   . CAD Father   . Hypertension Sister   . Breast cancer Sister   . Diabetes Brother   . Colon cancer Neg Hx   . Esophageal cancer Neg Hx   . Pancreatic cancer Neg Hx   . Stomach cancer Neg Hx     Social History   Socioeconomic History  . Marital status: Single    Spouse name: Not on file  . Number of children: Not on file  . Years of education: Not on file  . Highest education level: Not on file  Occupational History  . Not on file  Tobacco Use  . Smoking status: Former Smoker    Types: Cigarettes    Quit date: 06/21/2014     Years since quitting: 5.0  . Smokeless tobacco: Never Used  Substance and Sexual Activity  . Alcohol use: Not Currently  . Drug use: Never  . Sexual activity: Not on file  Other Topics Concern  . Not on file  Social History Narrative  . Not on file   Social Determinants of Health   Financial Resource Strain:   . Difficulty of Paying Living Expenses: Not on file  Food Insecurity:   . Worried About Charity fundraiser in the Last Year: Not on file  . Ran Out of Food in the Last Year: Not on file  Transportation Needs:   . Lack of Transportation (Medical): Not on file  . Lack of Transportation (Non-Medical): Not on file  Physical Activity:   . Days of Exercise per Week: Not on file  . Minutes of Exercise per Session: Not on file  Stress:   . Feeling of Stress : Not on file  Social Connections:   . Frequency of Communication with Friends and Family: Not on file  . Frequency of Social Gatherings with Friends and Family: Not on file  . Attends Religious Services: Not on file  . Active Member of Clubs or Organizations: Not on file  . Attends Archivist Meetings: Not on file  . Marital Status: Not on file  Intimate Partner Violence:   . Fear of Current or Ex-Partner: Not on file  . Emotionally Abused: Not on file  . Physically Abused: Not on file  . Sexually Abused: Not on file    Review of Systems: 12 system ROS is negative except as noted above with the additions of anxiety and allergies.   Physical Exam: General:   Alert,  well-nourished, pleasant and cooperative in NAD Head:  Normocephalic and atraumatic. Eyes:  Sclera clear, no icterus.   Conjunctiva pink. Ears:  Normal auditory acuity. Nose:  No deformity, discharge,  or lesions. Mouth:  No deformity or lesions.   Neck:  Supple; no masses or thyromegaly. Lungs:  Clear throughout to auscultation.   No wheezes. Heart:  Regular rate and rhythm; no murmurs. Abdomen:  Soft, nontender, nondistended, normal  bowel sounds, no rebound or guarding. No hepatosplenomegaly.   Rectal:  Deferred  Msk:  Symmetrical. No boney deformities LAD: No inguinal or umbilical LAD Extremities:  No clubbing or edema. Neurologic:  Alert and  oriented x4;  grossly nonfocal Skin:  No rash or bruise. Psych:  Alert and cooperative. Normal mood and affect.   Ezri Fanguy L. Tarri Glenn, MD, MPH 07/20/2019, 3:43 PM

## 2019-07-20 NOTE — Patient Instructions (Signed)
You have been scheduled for an endoscopy and colonoscopy. Please follow the written instructions given to you at your visit today. Please pick up your prep supplies at the pharmacy within the next 1-3 days. If you use inhalers (even only as needed), please bring them with you on the day of your procedure.  I value your feedback and thank you for entrusting [place] with your care. If you get a [facility] patient survey, I would appreciate you taking the time to let us know about your experience today. Thank you!   Due to recent changes in healthcare laws, you may see the results of your imaging and laboratory studies on MyChart before your provider has had a chance to review them.  We understand that in some cases there may be results that are confusing or concerning to you. Not all laboratory results come back in the same time frame and the provider may be waiting for multiple results in order to interpret others.  Please give [place] 48 hours in order for your provider to thoroughly review all the results before contacting the office for clarification of your results.    Tips for colonoscopy:  -STAY WELL HYDRATED FOR 3-4 DAYS PRIOR TO THE EXAM. This reduces nausea and dehydration.  -TO PREVENT SKIN/HEMORRHOID IRRITATION- prior to wiping, put A&Dointment or vaseline on the toilet paper. -Keep a towel or pad on the bed.  -DRINK 64oz of clear liquids in the morning of prep day (PRIOR TO STARTING THE PREP) to be sure that there is enough fluid to flush the colon and stay hydrated!!!! This is in addition to the fluids required for preparation.

## 2019-07-25 ENCOUNTER — Telehealth: Payer: Self-pay | Admitting: Emergency Medicine

## 2019-07-25 NOTE — Telephone Encounter (Signed)
Spoke with patients PCP office Dr. Renita Papa in [place], [place] from 2019. They state unfortunately they do not have any records from a GI doctor on file.

## 2019-07-25 NOTE — Telephone Encounter (Signed)
Thanks for your effort.

## 2019-07-30 ENCOUNTER — Ambulatory Visit: Payer: Medicare HMO | Attending: Internal Medicine

## 2019-07-30 DIAGNOSIS — Z23 Encounter for immunization: Secondary | ICD-10-CM | POA: Insufficient documentation

## 2019-07-30 NOTE — Progress Notes (Signed)
   Covid-19 Vaccination Clinic  Name:  Kimberly Conway    MRN: [id number] DOB: [birth year]  07/30/2019  [name] was observed post Covid-19 immunization for 15 minutes without incidence. She was provided with Vaccine Information Sheet and instruction to access the V-Safe system.   [name] was instructed to call 911 with any severe reactions post vaccine: [name] Kitchen Difficulty breathing  . Swelling of your face and throat  . A fast heartbeat  . A bad rash all over your body  . Dizziness and weakness    Immunizations Administered    Name Date Dose VIS Date Route   Pfizer COVID-19 Vaccine 07/30/2019 12:27 PM 0.3 mL 05/19/2019 Intramuscular   Manufacturer: [place]   Lot: [id number]   [place]: [id number]

## 2019-08-23 ENCOUNTER — Ambulatory Visit: Payer: Medicare HMO | Attending: Internal Medicine

## 2019-08-23 DIAGNOSIS — Z23 Encounter for immunization: Secondary | ICD-10-CM

## 2019-08-23 DIAGNOSIS — F331 Major depressive disorder, recurrent, moderate: Secondary | ICD-10-CM | POA: Diagnosis not present

## 2019-08-23 NOTE — Progress Notes (Signed)
   Covid-19 Vaccination Clinic  Name:  Kimberly Conway    MRN: [id number] DOB: 1955-09-08  08/23/2019  Ms. Kimberly Conway was observed post Covid-19 immunization for 15 minutes without incident. She was provided with Vaccine Information Sheet and instruction to access the V-Safe system.   Ms. Kimberly Conway was instructed to call 911 with any severe reactions post vaccine: [name] Kitchen Difficulty breathing  . Swelling of face and throat  . A fast heartbeat  . A bad rash all over body  . Dizziness and weakness   Immunizations Administered    Name Date Dose VIS Date Route   Pfizer COVID-19 Vaccine 08/23/2019 11:09 AM 0.3 mL 05/19/2019 Intramuscular   Manufacturer: [place]   Lot: [id number]   [place]: [id number]

## 2019-08-24 ENCOUNTER — Other Ambulatory Visit: Payer: Self-pay | Admitting: Gastroenterology

## 2019-08-24 ENCOUNTER — Ambulatory Visit (INDEPENDENT_AMBULATORY_CARE_PROVIDER_SITE_OTHER): Payer: Medicare HMO

## 2019-08-24 DIAGNOSIS — Z1159 Encounter for screening for other viral diseases: Secondary | ICD-10-CM | POA: Diagnosis not present

## 2019-08-25 ENCOUNTER — Telehealth: Payer: Self-pay | Admitting: Gastroenterology

## 2019-08-25 ENCOUNTER — Encounter: Payer: Self-pay | Admitting: Gastroenterology

## 2019-08-25 LAB — SARS CORONAVIRUS 2 (TAT 6-24 HRS): SARS Coronavirus 2: NEGATIVE

## 2019-08-25 MED ORDER — SUTAB 1479-225-188 MG PO TABS: 1.0000 | ORAL_TABLET | ORAL | 0 refills | Status: DC

## 2019-08-25 NOTE — Telephone Encounter (Signed)
Rx sent to pharmacy   

## 2019-08-28 ENCOUNTER — Encounter: Payer: Self-pay | Admitting: Gastroenterology

## 2019-08-28 ENCOUNTER — Other Ambulatory Visit: Payer: Self-pay

## 2019-08-28 ENCOUNTER — Ambulatory Visit (AMBULATORY_SURGERY_CENTER): Payer: Medicare HMO | Admitting: Gastroenterology

## 2019-08-28 VITALS — BP 106/66 | HR 55 | Temp 96.9°F | Resp 15 | Ht 62.0 in | Wt 161.0 lb

## 2019-08-28 DIAGNOSIS — Z8619 Personal history of other infectious and parasitic diseases: Secondary | ICD-10-CM

## 2019-08-28 DIAGNOSIS — D124 Benign neoplasm of descending colon: Secondary | ICD-10-CM

## 2019-08-28 DIAGNOSIS — K209 Esophagitis, unspecified without bleeding: Secondary | ICD-10-CM | POA: Diagnosis not present

## 2019-08-28 DIAGNOSIS — K2950 Unspecified chronic gastritis without bleeding: Secondary | ICD-10-CM | POA: Diagnosis not present

## 2019-08-28 DIAGNOSIS — I10 Essential (primary) hypertension: Secondary | ICD-10-CM | POA: Diagnosis not present

## 2019-08-28 DIAGNOSIS — K219 Gastro-esophageal reflux disease without esophagitis: Secondary | ICD-10-CM

## 2019-08-28 DIAGNOSIS — D122 Benign neoplasm of ascending colon: Secondary | ICD-10-CM | POA: Diagnosis not present

## 2019-08-28 DIAGNOSIS — D123 Benign neoplasm of transverse colon: Secondary | ICD-10-CM

## 2019-08-28 DIAGNOSIS — Z1211 Encounter for screening for malignant neoplasm of colon: Secondary | ICD-10-CM

## 2019-08-28 DIAGNOSIS — K449 Diaphragmatic hernia without obstruction or gangrene: Secondary | ICD-10-CM

## 2019-08-28 DIAGNOSIS — R1310 Dysphagia, unspecified: Secondary | ICD-10-CM

## 2019-08-28 DIAGNOSIS — Z8719 Personal history of other diseases of the digestive system: Secondary | ICD-10-CM | POA: Diagnosis not present

## 2019-08-28 HISTORY — PX: UPPER GI ENDOSCOPY: SHX6162

## 2019-08-28 HISTORY — PX: COLONOSCOPY: SHX174

## 2019-08-28 MED ORDER — OMEPRAZOLE 40 MG PO CPDR: 40.0000 mg | DELAYED_RELEASE_CAPSULE | Freq: Two times a day (BID) | ORAL | 3 refills | Status: DC

## 2019-08-28 MED ORDER — SODIUM CHLORIDE 0.9 % IV SOLN: 500.0000 mL | Freq: Once | INTRAVENOUS | Status: DC

## 2019-08-28 NOTE — Op Note (Signed)
[facility] Patient Name: Kimberly Conway Procedure Date: 08/28/2019 1:54 PM MRN: [id number] Endoscopist: Thornton Park MD, MD Age: 64 Referring MD:  Date of Birth: [birth year] Gender: Female Account #: [id number] Procedure:                Colonoscopy Indications:              Screening for colorectal malignant neoplasm, This                            is the patient's first colonoscopy                           No prior colon cancer screening                           No known family history of colon cancer or polyps Medicines:                Monitored Anesthesia Care Procedure:                Pre-Anesthesia Assessment:                           - Prior to the procedure, a History and Physical                            was performed, and patient medications and                            allergies were reviewed. The patient's tolerance of                            previous anesthesia was also reviewed. The risks                            and benefits of the procedure and the sedation                            options and risks were discussed with the patient.                            All questions were answered, and informed consent                            was obtained. Prior Anticoagulants: The patient has                            taken no previous anticoagulant or antiplatelet                            agents. ASA Grade Assessment: II - A patient with                            mild systemic disease. After reviewing the risks  and benefits, the patient was deemed in                            satisfactory condition to undergo the procedure.                           After obtaining informed consent, the colonoscope                            was passed under direct vision. Throughout the                            procedure, the patient's blood pressure, pulse, and                            oxygen saturations were monitored continuously.  The                            Colonoscope was introduced through the anus and                            advanced to the 4 cm into the ileum. A second                            forward view of the right colon was performed. The                            colonoscopy was performed without difficulty. The                            patient tolerated the procedure well. The quality                            of the bowel preparation was good. The terminal                            ileum, ileocecal valve, appendiceal orifice, and                            rectum were photographed. Scope In: 2:08:49 PM Scope Out: 2:25:59 PM Scope Withdrawal Time: 0 hours 14 minutes 10 seconds  Total Procedure Duration: 0 hours 17 minutes 10 seconds  Findings:                 The perianal and digital rectal examinations were                            normal.                           Non-bleeding internal hemorrhoids were found. The                            hemorrhoids were small.  Two sessile polyps were found in the descending                            colon. The polyps were 1 to 3 mm in size. These                            polyps were removed with a cold snare. Resection                            and retrieval were complete. Estimated blood loss                            was minimal.                           A 4 mm polyp was found in the splenic flexure. The                            polyp was flat. The polyp was removed with a cold                            snare. Resection and retrieval were complete.                            Estimated blood loss was minimal.                           Two sessile polyps were found in the ascending                            colon. The polyps were less than 1 mm in size.                            These polyps were removed with a cold biopsy                            forceps. Resection and retrieval were complete.                             Estimated blood loss was minimal.                           The exam was otherwise without abnormality on                            direct and retroflexion views. Complications:            No immediate complications. Estimated blood loss:                            Minimal. Estimated Blood Loss:     Estimated blood loss was minimal. Impression:               - Non-bleeding internal hemorrhoids.                           -  Two 1 to 3 mm polyps in the descending colon,                            removed with a cold snare. Resected and retrieved.                           - One 4 mm polyp at the splenic flexure, removed                            with a cold snare. Resected and retrieved.                           - Two less than 1 mm polyps in the ascending colon,                            removed with a cold biopsy forceps. Resected and                            retrieved.                           - The examination was otherwise normal on direct                            and retroflexion views. Recommendation:           - Patient has a contact number available for                            emergencies. The signs and symptoms of potential                            delayed complications were discussed with the                            patient. Return to normal activities tomorrow.                            Written discharge instructions were provided to the                            patient.                           - Resume previous diet.                           - Continue present medications.                           - Await pathology results.                           - Repeat colonoscopy date to be determined after  pending pathology results are reviewed for                            surveillance.                           - Emerging evidence supports eating a diet of                            fruits, vegetables, grains, calcium, and  yogurt                            while reducing red meat and alcohol may reduce the                            risk of colon cancer.                           - Thank you for allowing me to be involved in your                            colon cancer prevention. Thornton Park MD, MD 08/28/2019 2:40:27 PM This report has been signed electronically.

## 2019-08-28 NOTE — Progress Notes (Signed)
Report given to PACU, vss 

## 2019-08-28 NOTE — Op Note (Signed)
[facility] Patient Name: Kimberly Conway Procedure Date: 08/28/2019 1:55 PM MRN: [id number] Endoscopist: Thornton Park MD, MD Age: 64 Referring MD:  Date of Birth: [birth year] Gender: Female Account #: [id number] Procedure:                Upper GI endoscopy Indications:              Odynophagia, Chest pain (non cardiac)                           Reflux                           - UGI series in [place] shows reflux                           - symptoms may be worsened by tumeric                           Odynophagia/chest pain                           H pylori + while in [place] Medicines:                Monitored Anesthesia Care Procedure:                Pre-Anesthesia Assessment:                           - Prior to the procedure, a History and Physical                            was performed, and patient medications and                            allergies were reviewed. The patient's tolerance of                            previous anesthesia was also reviewed. The risks                            and benefits of the procedure and the sedation                            options and risks were discussed with the patient.                            All questions were answered, and informed consent                            was obtained. Prior Anticoagulants: The patient has                            taken no previous anticoagulant or antiplatelet                            agents. ASA Grade Assessment: II -  A patient with                            mild systemic disease. After reviewing the risks                            and benefits, the patient was deemed in                            satisfactory condition to undergo the procedure.                           After obtaining informed consent, the endoscope was                            passed under direct vision. Throughout the                            procedure, the patient's blood pressure, pulse, and             oxygen saturations were monitored continuously. The                            Endoscope was introduced through the mouth, and                            advanced to the third part of duodenum. The upper                            GI endoscopy was accomplished without difficulty.                            The patient tolerated the procedure well. Scope In: Scope Out: Findings:                 LA Grade C (one or more mucosal breaks continuous                            between tops of 2 or more mucosal folds, less than                            75% circumference) esophagitis with no bleeding was                            found. There is associated linear ulceration.                            Biopsies were taken with a cold forceps for                            histology. Estimated blood loss was minimal.                           A 3 cm grade IV Hill hiatal hernia was present.  Diffuse mild inflammation characterized by                            erythema, friability and granularity was found in                            the gastric body. Biopsies were taken from the                            antrum, body, and fundus with a cold forceps for                            histology. Biopsies were taken with a cold forceps                            for histology. Estimated blood loss was minimal.                           The examined duodenum was normal. Complications:            No immediate complications. Estimated blood loss:                            Minimal. Estimated Blood Loss:     Estimated blood loss was minimal. Impression:               - LA Grade C esophagitis with no bleeding. Biopsied.                           - 3 cm hiatal hernia.                           - Mild gastritis. Biopsied.                           - Normal examined duodenum. Recommendation:           - Patient has a contact number available for                             emergencies. The signs and symptoms of potential                            delayed complications were discussed with the                            patient. Return to normal activities tomorrow.                            Written discharge instructions were provided to the                            patient.                           - Resume previous diet.                           -  Continue present medications. Increase omeprazole                            to 40 mg BID x 8 weeks. After 8 weeks, reduce to 40                            mg QAM.                           - Await pathology results.                           - No aspirin, ibuprofen, naproxen, or other                            non-steroidal anti-inflammatory drugs.                           - Follow-up in the office for 8 weeks. Thornton Park MD, MD 08/28/2019 2:33:22 PM This report has been signed electronically.

## 2019-08-28 NOTE — Progress Notes (Signed)
Called to room to assist during endoscopic procedure.  Patient ID and intended procedure confirmed with present staff. Received instructions for my participation in the procedure from the performing physician.  

## 2019-08-28 NOTE — Patient Instructions (Signed)
Information on hiatal hernias, polyps, gastritis and esophagitis given to you today.  Avoid NSAIDS (Aspirin, Ibuprofen, Aleve, Naproxen), you may use Tylenol as needed.  Increase omeprazole to 40 mg twice a day for 8 weeks. Then reduce to 40 mg once a  day in the morning.  Eat a high fiber diet.  YOU HAD AN ENDOSCOPIC PROCEDURE TODAY AT [facility]:   Refer to the procedure report that was given to you for any specific questions about what was found during the examination.  If the procedure report does not answer your questions, please call your gastroenterologist to clarify.  If you requested that your care partner not be given the details of your procedure findings, then the procedure report has been included in a sealed envelope for you to review at your convenience later.  YOU SHOULD EXPECT: Some feelings of bloating in the abdomen. Passage of more gas than usual.  Walking can help get rid of the air that was put into your GI tract during the procedure and reduce the bloating. If you had a lower endoscopy (such as a colonoscopy or flexible sigmoidoscopy) you may notice spotting of blood in your stool or on the toilet paper. If you underwent a bowel prep for your procedure, you may not have a normal bowel movement for a few days.  Please Note:  You might notice some irritation and congestion in your nose or some drainage.  This is from the oxygen used during your procedure.  There is no need for concern and it should clear up in a day or so.  SYMPTOMS TO REPORT IMMEDIATELY:   Following lower endoscopy (colonoscopy or flexible sigmoidoscopy):  Excessive amounts of blood in the stool  Significant tenderness or worsening of abdominal pains  Swelling of the abdomen that is new, acute  Fever of 100F or higher   Following upper endoscopy (EGD)  Vomiting of blood or coffee ground material  New chest pain or pain under the shoulder blades  Painful or persistently difficult  swallowing  New shortness of breath  Fever of 100F or higher  Black, tarry-looking stools  For urgent or emergent issues, a gastroenterologist can be reached at any hour by calling [phone]. Do not use MyChart messaging for urgent concerns.    DIET:  We do recommend a small meal at first, but then you may proceed to your regular diet.  Drink plenty of fluids but you should avoid alcoholic beverages for 24 hours.  ACTIVITY:  You should plan to take it easy for the rest of today and you should NOT DRIVE or use heavy machinery until tomorrow (because of the sedation medicines used during the test).    FOLLOW UP: Our staff will call the number listed on your records 48-72 hours following your procedure to check on you and address any questions or concerns that you may have regarding the information given to you following your procedure. If we do not reach you, we will leave a message.  We will attempt to reach you two times.  During this call, we will ask if you have developed any symptoms of COVID 19. If you develop any symptoms (ie: fever, flu-like symptoms, shortness of breath, cough etc.) before then, please call [phone].  If you test positive for Covid 19 in the 2 weeks post procedure, please call and report this information to [place].    If any biopsies were taken you will be contacted by phone or by letter within  the next 1-3 weeks.  Please call us at [phone] if you have not heard about the biopsies in 3 weeks.    SIGNATURES/CONFIDENTIALITY: You and/or your care partner have signed paperwork which will be entered into your electronic medical record.  These signatures attest to the fact that that the information above on your After Visit Summary has been reviewed and is understood.  Full responsibility of the confidentiality of this discharge information lies with you and/or your care-partner.

## 2019-08-28 NOTE — Progress Notes (Signed)
[facility] Temp-[facility]  Pt's states no medical or surgical changes since previsit or office visit.

## 2019-08-30 ENCOUNTER — Encounter: Payer: Self-pay | Admitting: *Deleted

## 2019-08-30 ENCOUNTER — Telehealth: Payer: Self-pay

## 2019-08-30 NOTE — Telephone Encounter (Signed)
Left message on answering machine. 

## 2019-09-14 ENCOUNTER — Encounter: Payer: Self-pay | Admitting: Gastroenterology

## 2019-09-20 ENCOUNTER — Other Ambulatory Visit: Payer: Self-pay

## 2019-09-21 ENCOUNTER — Ambulatory Visit (INDEPENDENT_AMBULATORY_CARE_PROVIDER_SITE_OTHER): Payer: Medicare HMO | Admitting: Internal Medicine

## 2019-09-21 ENCOUNTER — Encounter: Payer: Self-pay | Admitting: Internal Medicine

## 2019-09-21 VITALS — BP 120/80 | HR 73 | Temp 97.0°F | Ht 61.0 in | Wt 162.6 lb

## 2019-09-21 DIAGNOSIS — Z13820 Encounter for screening for osteoporosis: Secondary | ICD-10-CM | POA: Diagnosis not present

## 2019-09-21 DIAGNOSIS — J309 Allergic rhinitis, unspecified: Secondary | ICD-10-CM

## 2019-09-21 DIAGNOSIS — E042 Nontoxic multinodular goiter: Secondary | ICD-10-CM | POA: Diagnosis not present

## 2019-09-21 DIAGNOSIS — F411 Generalized anxiety disorder: Secondary | ICD-10-CM

## 2019-09-21 DIAGNOSIS — K219 Gastro-esophageal reflux disease without esophagitis: Secondary | ICD-10-CM | POA: Diagnosis not present

## 2019-09-21 DIAGNOSIS — F331 Major depressive disorder, recurrent, moderate: Secondary | ICD-10-CM | POA: Diagnosis not present

## 2019-09-21 DIAGNOSIS — I10 Essential (primary) hypertension: Secondary | ICD-10-CM | POA: Diagnosis not present

## 2019-09-21 DIAGNOSIS — Z0000 Encounter for general adult medical examination without abnormal findings: Secondary | ICD-10-CM | POA: Diagnosis not present

## 2019-09-21 LAB — COMPREHENSIVE METABOLIC PANEL
ALT: 26 U/L (ref 0–35)
AST: 20 U/L (ref 0–37)
Albumin: 4.4 g/dL (ref 3.5–5.2)
Alkaline Phosphatase: 113 U/L (ref 39–117)
BUN: 13 mg/dL (ref 6–23)
CO2: 30 mEq/L (ref 19–32)
Calcium: 9.3 mg/dL (ref 8.4–10.5)
Chloride: 102 mEq/L (ref 96–112)
Creatinine, Ser: 0.76 mg/dL (ref 0.40–1.20)
GFR: 76.25 mL/min (ref >60.00)
Glucose, Bld: 96 mg/dL (ref 70–99)
Potassium: 4 mEq/L (ref 3.5–5.1)
Sodium: 138 mEq/L (ref 135–145)
Total Bilirubin: 0.7 mg/dL (ref 0.2–1.2)
Total Protein: 7.1 g/dL (ref 6.0–8.3)

## 2019-09-21 LAB — CBC WITH DIFFERENTIAL/PLATELET
Basophils Absolute: 0 10*3/uL (ref 0.0–0.1)
Basophils Relative: 0.3 % (ref 0.0–3.0)
Eosinophils Absolute: 0.1 10*3/uL (ref 0.0–0.7)
Eosinophils Relative: 1.3 % (ref 0.0–5.0)
HCT: 39.8 % (ref 36.0–46.0)
Hemoglobin: 14 g/dL (ref 12.0–15.0)
Lymphocytes Relative: 35.4 % (ref 12.0–46.0)
Lymphs Abs: 1.9 10*3/uL (ref 0.7–4.0)
MCHC: 35.2 g/dL (ref 30.0–36.0)
MCV: 93.1 fl (ref 78.0–100.0)
Monocytes Absolute: 0.4 10*3/uL (ref 0.1–1.0)
Monocytes Relative: 7.5 % (ref 3.0–12.0)
Neutro Abs: 3 10*3/uL (ref 1.4–7.7)
Neutrophils Relative %: 55.5 % (ref 43.0–77.0)
Platelets: 279 10*3/uL (ref 150.0–400.0)
RBC: 4.27 Mil/uL (ref 3.87–5.11)
RDW: 12.5 % (ref 11.5–15.5)
WBC: 5.4 10*3/uL (ref 4.0–10.5)

## 2019-09-21 LAB — LIPID PANEL
Cholesterol: 199 mg/dL (ref 0–200)
HDL: 53.1 mg/dL (ref >39.00)
LDL Cholesterol: 112 mg/dL — ABNORMAL HIGH (ref 0–99)
NonHDL: 145.85
Total CHOL/HDL Ratio: 4
Triglycerides: 167 mg/dL — ABNORMAL HIGH (ref 0.0–149.0)
VLDL: 33.4 mg/dL (ref 0.0–40.0)

## 2019-09-21 LAB — TSH: TSH: 2.51 u[IU]/mL (ref 0.35–4.50)

## 2019-09-21 LAB — VITAMIN B12: Vitamin B-12: 825 pg/mL (ref 211–911)

## 2019-09-21 LAB — HEMOGLOBIN A1C: Hgb A1c MFr Bld: 4.9 % (ref 4.6–6.5)

## 2019-09-21 LAB — VITAMIN D 25 HYDROXY (VIT D DEFICIENCY, FRACTURES): VITD: 39.32 ng/mL (ref 30.00–100.00)

## 2019-09-21 MED ORDER — FLUTICASONE PROPIONATE 50 MCG/ACT NA SUSP: 2.0000 | Freq: Every day | NASAL | 2 refills | Status: DC

## 2019-09-21 NOTE — Addendum Note (Signed)
Addended by: Elmer Picker on: 09/21/2019 11:32 AM   Modules accepted: Orders

## 2019-09-21 NOTE — Patient Instructions (Signed)
-Nice seeing you today!!  -Lab work today; will notify you once results are available.  -Remember your tetanus booster and your GYN follow up.  -Schedule follow up in 6 months.    Preventive Care 37 Years and Older, Female Preventive care refers to lifestyle choices and visits with your health care provider that can promote health and wellness. This includes:  A yearly physical exam. This is also called an annual well check.  Regular dental and eye exams.  Immunizations.  Screening for certain conditions.  Healthy lifestyle choices, such as diet and exercise. What can I expect for my preventive care visit? Physical exam Your health care provider will check:  Height and weight. These may be used to calculate body mass index (BMI), which is a measurement that tells if you are at a healthy weight.  Heart rate and blood pressure.  Your skin for abnormal spots. Counseling Your health care provider may ask you questions about:  Alcohol, tobacco, and drug use.  Emotional well-being.  Home and relationship well-being.  Sexual activity.  Eating habits.  History of falls.  Memory and ability to understand (cognition).  Work and work Statistician.  Pregnancy and menstrual history. What immunizations do I need?  Influenza (flu) vaccine  This is recommended every year. Tetanus, diphtheria, and pertussis (Tdap) vaccine  You may need a Td booster every 10 years. Varicella (chickenpox) vaccine  You may need this vaccine if you have not already been vaccinated. Zoster (shingles) vaccine  You may need this after age 67. Pneumococcal conjugate (PCV13) vaccine  One dose is recommended after age 25. Pneumococcal polysaccharide (PPSV23) vaccine  One dose is recommended after age 68. Measles, mumps, and rubella (MMR) vaccine  You may need at least one dose of MMR if you were born in 1957 or later. You may also need a second dose. Meningococcal conjugate (MenACWY)  vaccine  You may need this if you have certain conditions. Hepatitis A vaccine  You may need this if you have certain conditions or if you travel or work in places where you may be exposed to hepatitis A. Hepatitis B vaccine  You may need this if you have certain conditions or if you travel or work in places where you may be exposed to hepatitis B. Haemophilus influenzae type b (Hib) vaccine  You may need this if you have certain conditions. You may receive vaccines as individual doses or as more than one vaccine together in one shot (combination vaccines). Talk with your health care provider about the risks and benefits of combination vaccines. What tests do I need? Blood tests  Lipid and cholesterol levels. These may be checked every 5 years, or more frequently depending on your overall health.  Hepatitis C test.  Hepatitis B test. Screening  Lung cancer screening. You may have this screening every year starting at age 26 if you have a 30-pack-year history of smoking and currently smoke or have quit within the past 15 years.  Colorectal cancer screening. All adults should have this screening starting at age 27 and continuing until age 39. Your health care provider may recommend screening at age 8 if you are at increased risk. You will have tests every 1-10 years, depending on your results and the type of screening test.  Diabetes screening. This is done by checking your blood sugar (glucose) after you have not eaten for a while (fasting). You may have this done every 1-3 years.  Mammogram. This may be done every 1-2 years.  Talk with your health care provider about how often you should have regular mammograms.  BRCA-related cancer screening. This may be done if you have a family history of breast, ovarian, tubal, or peritoneal cancers. Other tests  Sexually transmitted disease (STD) testing.  Bone density scan. This is done to screen for osteoporosis. You may have this done  starting at age 31. Follow these instructions at home: Eating and drinking  Eat a diet that includes fresh fruits and vegetables, whole grains, lean protein, and low-fat dairy products. Limit your intake of foods with high amounts of sugar, saturated fats, and salt.  Take vitamin and mineral supplements as recommended by your health care provider.  Do not drink alcohol if your health care provider tells you not to drink.  If you drink alcohol: ? Limit how much you have to 0-1 drink a day. ? Be aware of how much alcohol is in your drink. In the [place] one drink equals one 12 oz bottle of beer (355 mL), one 5 oz glass of wine (148 mL), or one 1 oz glass of hard liquor (44 mL). Lifestyle  Take daily care of your teeth and gums.  Stay active. Exercise for at least 30 minutes on 5 or more days each week.  Do not use any products that contain nicotine or tobacco, such as cigarettes, e-cigarettes, and chewing tobacco. If you need help quitting, ask your health care provider.  If you are sexually active, practice safe sex. Use a condom or other form of protection in order to prevent STIs (sexually transmitted infections).  Talk with your health care provider about taking a low-dose aspirin or statin. What's next?  Go to your health care provider once a year for a well check visit.  Ask your health care provider how often you should have your eyes and teeth checked.  Stay up to date on all vaccines. This information is not intended to replace advice given to you by your health care provider. Make sure you discuss any questions you have with your health care provider. Document Revised: 05/19/2018 Document Reviewed: 05/19/2018 Elsevier Patient Education  2020 [employer].

## 2019-09-21 NOTE — Progress Notes (Signed)
Established Patient Office Visit     This visit occurred during the SARS-CoV-2 public health emergency.  Safety protocols were in place, including screening questions prior to the visit, additional usage of staff PPE, and extensive cleaning of exam room while observing appropriate contact time as indicated for disinfecting solutions.    CC/Reason for Visit: Annual preventive exam and initial Medicare wellness visit  HPI: Kimberly Conway is a 64 y.o. female who is coming in today for the above mentioned reasons. Past Medical History is significant for: Depression and anxiety followed by local psychiatry on Pristiq and as needed Ativan.  History of GERD with recent EGD and colonoscopy, seasonal allergies, history of thyroid nodules.  She has no major complaints today.  She received both Covid vaccines.  She has routine eye and dental care.   Past Medical/Surgical History: Past Medical History:  Diagnosis Date  . Depression   . GAD (generalized anxiety disorder)   . GERD (gastroesophageal reflux disease)   . HTN (hypertension)   . Multiple thyroid nodules   . Seasonal allergies     Past Surgical History:  Procedure Laterality Date  . APPENDECTOMY      Social History:  reports that she quit smoking about 5 years ago. Her smoking use included cigarettes. She has never used smokeless tobacco. She reports previous alcohol use. She reports that she does not use drugs.  Allergies: No Known Allergies  Family History:  Family History  Problem Relation Age of Onset  . Diabetes Mother   . Breast cancer Mother   . CAD Father   . Hypertension Sister   . Breast cancer Sister   . Diabetes Brother   . Colon cancer Neg Hx   . Esophageal cancer Neg Hx   . Pancreatic cancer Neg Hx   . Stomach cancer Neg Hx      Current Outpatient Medications:  .  b complex vitamins tablet, Take 1 tablet by mouth daily., Disp: , Rfl:  .  Calcium 500-125 MG-UNIT TABS, Take by mouth., Disp: , Rfl:   .  Calcium Citrate (CITRACAL PO), Take 1,200 mg by mouth 3 (three) times daily. , Disp: , Rfl:  .  desvenlafaxine (PRISTIQ) 50 MG 24 hr tablet, Take 50 mg by mouth daily., Disp: , Rfl:  .  fluticasone (FLONASE) 50 MCG/ACT nasal spray, Place 2 sprays into both nostrils daily., Disp: 16 g, Rfl: 2 .  LORazepam (ATIVAN) 0.5 MG tablet, Take 0.5 mg by mouth daily as needed., Disp: , Rfl:  .  metoprolol succinate (TOPROL-XL) 25 MG 24 hr tablet, Take 25 mg by mouth. Take half tablet daily, Disp: , Rfl:  .  Multiple Vitamin (MULTIVITAMIN) tablet, Take 1 tablet by mouth daily., Disp: , Rfl:  .  omega-3 fish oil (MAXEPA) 1000 MG CAPS capsule, Take 1 capsule by mouth daily. , Disp: , Rfl:  .  omeprazole (PRILOSEC) 40 MG capsule, Take 1 capsule (40 mg total) by mouth 2 (two) times daily., Disp: 90 capsule, Rfl: 3 .  OVER THE COUNTER MEDICATION, Glucosamine 1500 mg  Chon 1200 mg, Disp: , Rfl:  .  Probiotic Product (PROBIOTIC-10 PO), Take by mouth., Disp: , Rfl:   Review of Systems:  Constitutional: Denies fever, chills, diaphoresis, appetite change and fatigue.  HEENT: Denies photophobia, eye pain, redness, hearing loss, ear pain, congestion, sore throat, rhinorrhea, sneezing, mouth sores, trouble swallowing, neck pain, neck stiffness and tinnitus.   Respiratory: Denies SOB, DOE, cough, chest tightness,  and  wheezing.   Cardiovascular: Denies chest pain, palpitations and leg swelling.  Gastrointestinal: Denies nausea, vomiting, abdominal pain, diarrhea, constipation, blood in stool and abdominal distention.  Genitourinary: Denies dysuria, urgency, frequency, hematuria, flank pain and difficulty urinating.  Endocrine: Denies: hot or cold intolerance, sweats, changes in hair or nails, polyuria, polydipsia. Musculoskeletal: Denies myalgias, back pain, joint swelling, arthralgias and gait problem.  Skin: Denies pallor, rash and wound.  Neurological: Denies dizziness, seizures, syncope, weakness,  light-headedness, numbness and headaches.  Hematological: Denies adenopathy. Easy bruising, personal or family bleeding history  Psychiatric/Behavioral: Denies suicidal ideation, mood changes, confusion, nervousness, sleep disturbance and agitation    Physical Exam: Vitals:   09/21/19 1054  BP: 120/80  Pulse: 73  Temp: (!) 97 F (36.1 C)  TempSrc: Temporal  SpO2: 97%  Weight: 162 lb 9.6 oz (73.8 kg)  Height: 5' 1"  (1.549 m)    Body mass index is 30.72 kg/m.   Constitutional: NAD, calm, comfortable Eyes: PERRL, lids and conjunctivae normal ENMT: Mucous membranes are moist. Tympanic membrane is pearly white, no erythema or bulging. Neck: normal, supple, no masses, no thyromegaly Respiratory: clear to auscultation bilaterally, no wheezing, no crackles. Normal respiratory effort. No accessory muscle use.  Cardiovascular: Regular rate and rhythm, no murmurs / rubs / gallops. No extremity edema. 2+ pedal pulses. No carotid bruits.  Abdomen: no tenderness, no masses palpated. No hepatosplenomegaly. Bowel sounds positive.  Musculoskeletal: no clubbing / cyanosis. No joint deformity upper and lower extremities. Good ROM, no contractures. Normal muscle tone.  Skin: no rashes, lesions, ulcers. No induration Neurologic: CN 2-12 grossly intact. Sensation intact, DTR normal. Strength 5/5 in all 4.  Psychiatric: Normal judgment and insight. Alert and oriented x 3. Normal mood.    Initial Medicare wellness visit   1. Risk factors, based on past  M,S,F -cardiovascular disease risk factors include history of hypertension   2.  Physical activities: She is very sedentary   3.  Depression/mood:  She has a history of depression but her mood appears stable   4.  Hearing:  No perceived issues   5.  ADL's: Independent in all ADLs   6.  Fall risk:  Low fall risk   7.  Home safety: No problems identified   8.  Height weight, and visual acuity: Height and weight as above, visual acuity is  20/25 with the right eye, 20/40 with the left eye and 20/25 with eyes together   9.  Counseling:  Advised increase diet and exercise to achieve weight loss   10. Lab orders based on risk factors: Laboratory update will be reviewed   11. Referral :  None today   12. Care plan:  Follow-up 6 months   13. Cognitive assessment:  No cognitive impairment   14. Screening: Patient provided with a written and personalized 5-10 year screening schedule in the AVS.   yes   15. Provider List Update:   PCP only  16. Advance Directives: Full code     Office Visit from 06/22/2019 in [place] at [place]  PHQ-9 Total Score  4      Fall Risk  06/22/2019  Falls in the past year? 0  Number falls in past yr: 0  Injury with Fall? 0     Impression and Plan:  Encounter for preventive health examination  -She has routine eye and dental care. -She has received both Covid vaccines, is due for tetanus booster, otherwise immunizations are up-to-date and age-appropriate. -Screening labs today. -Healthy lifestyle  has been discussed in detail. -She had her first colonoscopy this year, unclear yet on follow-up schedule. -She had a normal mammogram in August 2020. -She has yet to establish care with a local GYN. -DEXA scan will be requested today.  Allergic rhinitis, unspecified seasonality, unspecified trigger  - Plan: fluticasone (FLONASE) 50 MCG/ACT nasal spray  Screening for osteoporosis  - Plan: DG Bone Density  Essential hypertension  -Well-controlled on current regimen  Gastroesophageal reflux disease without esophagitis -Well-controlled on PPI therapy  Multiple thyroid nodules  -Recent ultrasound shows no nodules that meet criteria for aspiration follow-up.  Moderate episode of recurrent major depressive disorder (HCC) GAD (generalized anxiety disorder) -She follows with psychiatry.     Patient Instructions  -Nice seeing you today!!  -Lab work today; will notify you  once results are available.  -Remember your tetanus booster and your GYN follow up.  -Schedule follow up in 6 months.    Preventive Care 83 Years and Older, Female Preventive care refers to lifestyle choices and visits with your health care provider that can promote health and wellness. This includes:  A yearly physical exam. This is also called an annual well check.  Regular dental and eye exams.  Immunizations.  Screening for certain conditions.  Healthy lifestyle choices, such as diet and exercise. What can I expect for my preventive care visit? Physical exam Your health care provider will check:  Height and weight. These may be used to calculate body mass index (BMI), which is a measurement that tells if you are at a healthy weight.  Heart rate and blood pressure.  Your skin for abnormal spots. Counseling Your health care provider may ask you questions about:  Alcohol, tobacco, and drug use.  Emotional well-being.  Home and relationship well-being.  Sexual activity.  Eating habits.  History of falls.  Memory and ability to understand (cognition).  Work and work Statistician.  Pregnancy and menstrual history. What immunizations do I need?  Influenza (flu) vaccine  This is recommended every year. Tetanus, diphtheria, and pertussis (Tdap) vaccine  You may need a Td booster every 10 years. Varicella (chickenpox) vaccine  You may need this vaccine if you have not already been vaccinated. Zoster (shingles) vaccine  You may need this after age 18. Pneumococcal conjugate (PCV13) vaccine  One dose is recommended after age 11. Pneumococcal polysaccharide (PPSV23) vaccine  One dose is recommended after age 58. Measles, mumps, and rubella (MMR) vaccine  You may need at least one dose of MMR if you were born in 1957 or later. You may also need a second dose. Meningococcal conjugate (MenACWY) vaccine  You may need this if you have certain  conditions. Hepatitis A vaccine  You may need this if you have certain conditions or if you travel or work in places where you may be exposed to hepatitis A. Hepatitis B vaccine  You may need this if you have certain conditions or if you travel or work in places where you may be exposed to hepatitis B. Haemophilus influenzae type b (Hib) vaccine  You may need this if you have certain conditions. You may receive vaccines as individual doses or as more than one vaccine together in one shot (combination vaccines). Talk with your health care provider about the risks and benefits of combination vaccines. What tests do I need? Blood tests  Lipid and cholesterol levels. These may be checked every 5 years, or more frequently depending on your overall health.  Hepatitis C test.  Hepatitis B  test. Screening  Lung cancer screening. You may have this screening every year starting at age 71 if you have a 30-pack-year history of smoking and currently smoke or have quit within the past 15 years.  Colorectal cancer screening. All adults should have this screening starting at age 30 and continuing until age 19. Your health care provider may recommend screening at age 74 if you are at increased risk. You will have tests every 1-10 years, depending on your results and the type of screening test.  Diabetes screening. This is done by checking your blood sugar (glucose) after you have not eaten for a while (fasting). You may have this done every 1-3 years.  Mammogram. This may be done every 1-2 years. Talk with your health care provider about how often you should have regular mammograms.  BRCA-related cancer screening. This may be done if you have a family history of breast, ovarian, tubal, or peritoneal cancers. Other tests  Sexually transmitted disease (STD) testing.  Bone density scan. This is done to screen for osteoporosis. You may have this done starting at age 51. Follow these instructions at  home: Eating and drinking  Eat a diet that includes fresh fruits and vegetables, whole grains, lean protein, and low-fat dairy products. Limit your intake of foods with high amounts of sugar, saturated fats, and salt.  Take vitamin and mineral supplements as recommended by your health care provider.  Do not drink alcohol if your health care provider tells you not to drink.  If you drink alcohol: ? Limit how much you have to 0-1 drink a day. ? Be aware of how much alcohol is in your drink. In the [place] one drink equals one 12 oz bottle of beer (355 mL), one 5 oz glass of wine (148 mL), or one 1 oz glass of hard liquor (44 mL). Lifestyle  Take daily care of your teeth and gums.  Stay active. Exercise for at least 30 minutes on 5 or more days each week.  Do not use any products that contain nicotine or tobacco, such as cigarettes, e-cigarettes, and chewing tobacco. If you need help quitting, ask your health care provider.  If you are sexually active, practice safe sex. Use a condom or other form of protection in order to prevent STIs (sexually transmitted infections).  Talk with your health care provider about taking a low-dose aspirin or statin. What's next?  Go to your health care provider once a year for a well check visit.  Ask your health care provider how often you should have your eyes and teeth checked.  Stay up to date on all vaccines. This information is not intended to replace advice given to you by your health care provider. Make sure you discuss any questions you have with your health care provider. Document Revised: 05/19/2018 Document Reviewed: 05/19/2018 Elsevier Patient Education  2020 [place], [place] [facility]

## 2019-09-28 ENCOUNTER — Ambulatory Visit (INDEPENDENT_AMBULATORY_CARE_PROVIDER_SITE_OTHER): Payer: Medicare HMO | Admitting: Adult Health

## 2019-09-28 ENCOUNTER — Other Ambulatory Visit: Payer: Self-pay

## 2019-09-28 ENCOUNTER — Encounter: Payer: Self-pay | Admitting: Adult Health

## 2019-09-28 VITALS — BP 119/68 | HR 65 | Ht 61.0 in | Wt 160.0 lb

## 2019-09-28 DIAGNOSIS — F331 Major depressive disorder, recurrent, moderate: Secondary | ICD-10-CM | POA: Diagnosis not present

## 2019-09-28 DIAGNOSIS — F411 Generalized anxiety disorder: Secondary | ICD-10-CM | POA: Diagnosis not present

## 2019-09-28 MED ORDER — LORAZEPAM 0.5 MG PO TABS: 0.5000 mg | ORAL_TABLET | Freq: Every day | ORAL | 2 refills | Status: DC | PRN

## 2019-09-28 MED ORDER — DESVENLAFAXINE SUCCINATE ER 50 MG PO TB24: 50.0000 mg | ORAL_TABLET | Freq: Every day | ORAL | 1 refills | Status: DC

## 2019-09-28 NOTE — Progress Notes (Signed)
[facility] MD/PA/NP Initial Note  09/28/2019 11:43 AM Kimberly Conway  MRN:  [id number]  Chief Complaint:   HPI:   Describes mood today as "ok". Pleasant. Denies tearfulness. Mood symptoms - denies depression, anxiety, and irritability. Stating "I'm feeling at peace right now, I'm doing good". Also stating "my cats keep me company". Stable interest and motivation. Taking medications as prescribed and feels they work well.  Energy levels "good". Active, does not have a regular exercise routine. Plans to start again. Works full-time.  Enjoys some usual interests and activities. Single. Lives alone with 2 cats. No children. Has a sister local - sees every weekend. Other family lives in [place], [place]. Spending time with family. Appetite adequate. Weight 160 pounds, 61". Sleeps well most nights. Averages 7 hours. Focus and concentration stable. Completing tasks. Managing aspects of household. 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  Denies SI or HI. Denies AH or VH.  Previous medication trials: Ativan, Pristiq  Visit Diagnosis:    ICD-10-CM   1. Generalized anxiety disorder  F41.1   2. Major depressive disorder, recurrent episode, moderate (HCC)  F33.1     Past Psychiatric History: IOP program for 3 months for having anxiety and depression x 15 years ago.  Past Medical History:  Past Medical History:  Diagnosis Date  . Depression   . GAD (generalized anxiety disorder)   . GERD (gastroesophageal reflux disease)   . HTN (hypertension)   . Multiple thyroid nodules   . Seasonal allergies     Past Surgical History:  Procedure Laterality Date  . APPENDECTOMY      Family Psychiatric History: Denies any family history of diagnosed mental health issues.   Family History:  Family History  Problem Relation Age of Onset  . Diabetes Mother   . Breast cancer Mother   . CAD Father   . Hypertension Sister   . Breast cancer Sister   . Diabetes  Brother   . Colon cancer Neg Hx   . Esophageal cancer Neg Hx   . Pancreatic cancer Neg Hx   . Stomach cancer Neg Hx     Social History:  Social History   Socioeconomic History  . Marital status: Single    Spouse name: Not on file  . Number of children: Not on file  . Years of education: Not on file  . Highest education level: Not on file  Occupational History  . Not on file  Tobacco Use  . Smoking status: Former Smoker    Types: Cigarettes    Quit date: 06/21/2014    Years since quitting: 5.2  . Smokeless tobacco: Never Used  Substance and Sexual Activity  . Alcohol use: Not Currently  . Drug use: Never  . Sexual activity: Not on file  Other Topics Concern  . Not on file  Social History Narrative  . Not on file   Social Determinants of Health   Financial Resource Strain:   . Difficulty of Paying Living Expenses:   Food Insecurity:   . Worried About Charity fundraiser in the Last Year:   . Arboriculturist in the Last Year:   Transportation Needs:   . Film/video editor (Medical):   [name] Kitchen Lack of Transportation (Non-Medical):   Physical Activity:   . Days of Exercise per Week:   . Minutes of Exercise per Session:   Stress:   . Feeling of Stress :   Social  Connections:   . Frequency of Communication with Friends and Family:   . Frequency of Social Gatherings with Friends and Family:   . Attends Religious Services:   . Active Member of Clubs or Organizations:   . Attends Archivist Meetings:   [name] Kitchen Marital Status:     Allergies: No Known Allergies  Metabolic Disorder Labs: Lab Results  Component Value Date   HGBA1C 4.9 09/21/2019   No results found for: PROLACTIN Lab Results  Component Value Date   CHOL 199 09/21/2019   TRIG 167.0 (H) 09/21/2019   HDL 53.10 09/21/2019   CHOLHDL 4 09/21/2019   VLDL 33.4 09/21/2019   LDLCALC 112 (H) 09/21/2019   Lab Results  Component Value Date   TSH 2.51 09/21/2019    Therapeutic Level Labs: No  results found for: LITHIUM No results found for: VALPROATE No components found for:  CBMZ  Current Medications: Current Outpatient Medications  Medication Sig Dispense Refill  . b complex vitamins tablet Take 1 tablet by mouth daily.    . Calcium 500-125 MG-UNIT TABS Take by mouth.    . Calcium Citrate (CITRACAL PO) Take 1,200 mg by mouth 3 (three) times daily.     [name] Kitchen desvenlafaxine (PRISTIQ) 50 MG 24 hr tablet Take 50 mg by mouth daily.    . fluticasone (FLONASE) 50 MCG/ACT nasal spray Place 2 sprays into both nostrils daily. 16 g 2  . LORazepam (ATIVAN) 0.5 MG tablet Take 0.5 mg by mouth daily as needed.    . metoprolol succinate (TOPROL-XL) 25 MG 24 hr tablet Take 25 mg by mouth. Take half tablet daily    . Multiple Vitamin (MULTIVITAMIN) tablet Take 1 tablet by mouth daily.    [name] Kitchen omega-3 fish oil (MAXEPA) 1000 MG CAPS capsule Take 1 capsule by mouth daily.     [name] Kitchen omeprazole (PRILOSEC) 40 MG capsule Take 1 capsule (40 mg total) by mouth 2 (two) times daily. 90 capsule 3  . OVER THE COUNTER MEDICATION Glucosamine 1500 mg  Chon 1200 mg    . Probiotic Product (PROBIOTIC-10 PO) Take by mouth.     No current facility-administered medications for this visit.    Medication Side Effects: none  Orders placed this visit:  No orders of the defined types were placed in this encounter.   Psychiatric Specialty Exam:  Review of Systems  There were no vitals taken for this visit.There is no height or weight on file to calculate BMI.  General Appearance: Neat and Well Groomed  Eye Contact:  Good  Speech:  Clear and Coherent and Normal Rate  Volume:  Normal  Mood:  Euthymic  Affect:  Appropriate and Congruent  Thought Process:  Coherent and Descriptions of Associations: Intact  Orientation:  Full (Time, Place, and Person)  Thought Content: Logical   Suicidal Thoughts:  No  Homicidal Thoughts:  No  Memory:  WNL  Judgement:  Good  Insight:  Good  Psychomotor Activity:  Normal   Concentration:  Concentration: Good  Recall:  Good  Fund of Knowledge: Good  Language: Good  Assets:  Communication Skills Desire for Improvement Financial Resources/Insurance Housing Intimacy Leisure Time Physical Health Resilience Social Support Talents/Skills Transportation Vocational/Educational  ADL's:  Intact  Cognition: WNL  Prognosis:  Good   Screenings:  PHQ2-9     Office Visit from 09/21/2019 in [place] at [facility] from 06/22/2019 in [place] at [facility] Total Score  2  0  PHQ-9 Total Score  5  4      Receiving Psychotherapy: No   Treatment Plan/Recommendations:   Plan:  PDMP reviewed  1. Pristiq 50mg  daily 2. Ativan 0.5mg  daily as needed  Read and reviewed note with patient for accuracy.   RTC 3 months  Patient advised to contact office with any questions, adverse effects, or acute worsening in signs and symptoms.  Discussed potential benefits, risk, and side effects of benzodiazepines to include potential risk of tolerance and dependence, as well as possible drowsiness.  Advised patient not to drive if experiencing drowsiness and to take lowest possible effective dose to minimize risk of dependence and tolerance.  Greater than 50% of face to face time with patient was spent on counseling and coordination of care. We discussed medications and compliance. Also discussed "crackiling" noise in ear. Will follow up with PCP/audiology.    Aloha Gell, NP

## 2019-10-03 ENCOUNTER — Ambulatory Visit: Payer: Medicare HMO | Admitting: Adult Health

## 2019-10-04 ENCOUNTER — Other Ambulatory Visit: Payer: Self-pay | Admitting: *Deleted

## 2019-10-04 DIAGNOSIS — J309 Allergic rhinitis, unspecified: Secondary | ICD-10-CM

## 2019-10-04 MED ORDER — FLUTICASONE PROPIONATE 50 MCG/ACT NA SUSP: 2.0000 | Freq: Every day | NASAL | 2 refills | Status: DC

## 2019-10-05 ENCOUNTER — Other Ambulatory Visit: Payer: Self-pay

## 2019-10-05 DIAGNOSIS — F411 Generalized anxiety disorder: Secondary | ICD-10-CM

## 2019-10-05 MED ORDER — LORAZEPAM 0.5 MG PO TABS: 0.5000 mg | ORAL_TABLET | Freq: Every day | ORAL | 2 refills | Status: DC | PRN

## 2019-10-24 ENCOUNTER — Ambulatory Visit: Payer: Medicare HMO | Admitting: Gastroenterology

## 2019-10-24 ENCOUNTER — Encounter: Payer: Self-pay | Admitting: Gastroenterology

## 2019-10-24 VITALS — BP 108/66 | HR 70 | Ht 61.0 in | Wt 163.8 lb

## 2019-10-24 DIAGNOSIS — K219 Gastro-esophageal reflux disease without esophagitis: Secondary | ICD-10-CM | POA: Diagnosis not present

## 2019-10-24 MED ORDER — OMEPRAZOLE 20 MG PO CPDR: 20.0000 mg | DELAYED_RELEASE_CAPSULE | Freq: Two times a day (BID) | ORAL | 3 refills | Status: DC

## 2019-10-24 NOTE — Patient Instructions (Addendum)
If you are age 66 or older, your body mass index should be between 23-30. Your Body mass index is 30.95 kg/m. If this is out of the aforementioned range listed, please consider follow up with your Primary Care Provider.  If you are age 50 or younger, your body mass index should be between 19-25. Your Body mass index is 30.95 kg/m. If this is out of the aformentioned range listed, please consider follow up with your Primary Care Provider.   We have sent the following medications to your pharmacy for you to pick up at your convenience:  Continue to take your omeprazole 20 mg twice daily.  I Recommend taking a daily calcium and vitamin d.  Due to recent changes in healthcare laws, you may see the results of your imaging and laboratory studies on MyChart before your provider has had a chance to review them.  We understand that in some cases there may be results that are confusing or concerning to you. Not all laboratory results come back in the same time frame and the provider may be waiting for multiple results in order to interpret others.  Please give [place] 48 hours in order for your provider to thoroughly review all the results before contacting the office for clarification of your results.   .   Thank you for putting your trust in me. Let's plan at least an annual follow-up. Please call me with any questions or concerns prior to that time.

## 2019-10-24 NOTE — Progress Notes (Signed)
Referring Provider: Isaac Bliss, [facility]* Primary Care Physician:  Isaac Bliss, Rayford Halsted, MD  Chief complaint:  Reflux   IMPRESSION:  LA Class C Reflux esophagitis    - UGI series in [place] shows reflux    - symptoms may be worsened by tumeric    - esophageal biopsies negative for H pylori    - symptoms improving on PPI therapy Odynophagia/chest pain, improved H pylori + while in [place]    - gastric biopsies negative for H pylori on EGD 08/28/19 History of colon polyps    - 1 tubular adenoma on colonoscopy 08/28/19    - surveillance colonoscopy due 2028 No known family history of colon cancer or polyps  PLAN: Continue omeprazole 20 mg BID (3 months with 3 refills) Work to maintain a healthy weight Recommend daily calcium and vitamin D  Surveillance colonoscopy 2028 Follow-up annually, earlier if needed  Please see the "Patient Instructions" section for addition details about the plan.  I spent 25 minutes, including communicating results with the patient directly, face-to-face time with the patient, ordering studies and medications as appropriate, and documentation.    HPI: Kimberly Conway is a 64 y.o. female under evaluation of reflux. 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 She has depression and anxiety. Moved back to [place] from [place]. Allergies have been worse since she returned.   Has had reflux x 2 years with associated eructation, odynophagia/chest pain with hot liquids despite omeprazole. H pylori breath + antibody 01/25/18 (prior to moving to [place] from [place]). Was told she needed an  EGD for diagnosis.   Upper GI series 05/10/2015 for reflux and chest pain showed mild gastroesophageal reflux to the level of the distal esophagus.  The exam was otherwise normal.  EGD 08/28/19 showed LA Class C reflux esophagitis, a 3 cm hiatal hernia, and chronic gastritis. There was no H pylori. No eosinophilic esophagitis.   Screening Colonoscopy 08/28/19 showed: internal hemorrhoids; two  descending colon polyps, a splenic flexure polyp, and two ascending colon polyps. One polyp was a tubular adenoma, the other polyps were hyperplastic or benign polyps.   She returns now in scheduled follow-up on omeprazole to 40 mg BID x 8 weeks. Overall feeling much better.  One episode of chest pain following her endoscopy.  She feels like omeprazole 20 mg BID is plenty. No new complaints or concerns.    Labs 09/21/19: normal CMP, CBC, TSH  Past Medical History:  Diagnosis Date  . Depression   . GAD (generalized anxiety disorder)   . GERD (gastroesophageal reflux disease)   . HTN (hypertension)   . Multiple thyroid nodules   . Seasonal allergies     Past Surgical History:  Procedure Laterality Date  . APPENDECTOMY      Current Outpatient Medications  Medication Sig Dispense Refill  . b complex vitamins tablet Take 1 tablet by mouth daily.    . Calcium 500-125 MG-UNIT TABS Take by mouth.    . Calcium Citrate (CITRACAL PO) Take 1,200 mg by mouth 3 (three) times daily.     [name] Kitchen desvenlafaxine (PRISTIQ) 50 MG 24 hr tablet Take 1 tablet (50 mg total) by mouth daily. 90 tablet 1  . fluticasone (FLONASE) 50 MCG/ACT nasal spray Place 2 sprays into both nostrils daily. 16 g 2  . glucosamine-chondroitin 500-400 MG tablet Take 1 tablet by mouth daily.    [name] Kitchen LORazepam (ATIVAN) 0.5 MG tablet Take 1 tablet (0.5 mg total) by mouth daily as needed. 30 tablet 2  .  metoprolol succinate (TOPROL-XL) 25 MG 24 hr tablet Take 25 mg by mouth. Take half tablet daily    . Multiple Vitamin (MULTIVITAMIN) tablet Take 1 tablet by mouth daily.    [name] Kitchen omega-3 fish oil (MAXEPA) 1000 MG CAPS capsule Take 1 capsule by mouth daily.     [name] Kitchen omeprazole (PRILOSEC) 20 MG capsule Take 20 mg by mouth daily.    [name] Kitchen OVER THE COUNTER MEDICATION Glucosamine 1500 mg  Chon 1200 mg    . Probiotic Product (PROBIOTIC-10 PO) Take by mouth.     No current facility-administered medications for this visit.    Allergies as of  10/24/2019  . (No Known Allergies)    Family History  Problem Relation Age of Onset  . Diabetes Mother   . Breast cancer Mother   . CAD Father   . Hypertension Sister   . Breast cancer Sister   . Diabetes Brother   . Colon cancer Neg Hx   . Esophageal cancer Neg Hx   . Pancreatic cancer Neg Hx   . Stomach cancer Neg Hx     Social History   Socioeconomic History  . Marital status: Single    Spouse name: Not on file  . Number of children: Not on file  . Years of education: Not on file  . Highest education level: Not on file  Occupational History  . Not on file  Tobacco Use  . Smoking status: Former Smoker    Types: Cigarettes    Quit date: 06/21/2014    Years since quitting: 5.3  . Smokeless tobacco: Never Used  Substance and Sexual Activity  . Alcohol use: Not Currently  . Drug use: Never  . Sexual activity: Not on file  Other Topics Concern  . Not on file  Social History Narrative  . Not on file   Social Determinants of Health   Financial Resource Strain:   . Difficulty of Paying Living Expenses:   Food Insecurity:   . Worried About Charity fundraiser in the Last Year:   . Arboriculturist in the Last Year:   Transportation Needs:   . Film/video editor (Medical):   [name] Kitchen Lack of Transportation (Non-Medical):   Physical Activity:   . Days of Exercise per Week:   . Minutes of Exercise per Session:   Stress:   . Feeling of Stress :   Social Connections:   . Frequency of Communication with Friends and Family:   . Frequency of Social Gatherings with Friends and Family:   . Attends Religious Services:   . Active Member of Clubs or Organizations:   . Attends Archivist Meetings:   [name] Kitchen Marital Status:   Intimate Partner Violence:   . Fear of Current or Ex-Partner:   . Emotionally Abused:   [name] Kitchen Physically Abused:   . Sexually Abused:     Physical Exam: General:   Alert,  well-nourished, pleasant and cooperative in NAD Head:  Normocephalic and  atraumatic. Eyes:  Sclera clear, no icterus.   Conjunctiva pink. Abdomen:  Soft, nontender, nondistended, normal bowel sounds, no rebound or guarding. No hepatosplenomegaly.   Neurologic:  Alert and  oriented x4;  grossly nonfocal Skin:  No rash or bruise. Psych:  Alert and cooperative. Normal mood and affect.   Merwin Breden L. Tarri Glenn, MD, MPH 10/24/2019, 10:39 AM

## 2019-11-08 ENCOUNTER — Telehealth: Payer: Self-pay | Admitting: Internal Medicine

## 2019-11-08 NOTE — Telephone Encounter (Signed)
The patient is wanting to know if Dr. Jerilee Hoh knows a good dermatologist to refer her to that accepts her insurance.  Please advise

## 2019-11-08 NOTE — Telephone Encounter (Signed)
Recommend [facility]. She will have to ask them if they accept her insurance.

## 2019-11-09 NOTE — Telephone Encounter (Signed)
Patient is aware. Patient will call back if a referral is needed.

## 2019-11-24 DIAGNOSIS — H5213 Myopia, bilateral: Secondary | ICD-10-CM | POA: Diagnosis not present

## 2019-11-24 DIAGNOSIS — Z0100 Encounter for examination of eyes and vision without abnormal findings: Secondary | ICD-10-CM | POA: Diagnosis not present

## 2019-12-09 DIAGNOSIS — Z20822 Contact with and (suspected) exposure to covid-19: Secondary | ICD-10-CM | POA: Diagnosis not present

## 2019-12-09 DIAGNOSIS — J0100 Acute maxillary sinusitis, unspecified: Secondary | ICD-10-CM | POA: Diagnosis not present

## 2019-12-09 DIAGNOSIS — R110 Nausea: Secondary | ICD-10-CM | POA: Diagnosis not present

## 2019-12-09 DIAGNOSIS — Z03818 Encounter for observation for suspected exposure to other biological agents ruled out: Secondary | ICD-10-CM | POA: Diagnosis not present

## 2019-12-19 DIAGNOSIS — R69 Illness, unspecified: Secondary | ICD-10-CM | POA: Diagnosis not present

## 2019-12-27 ENCOUNTER — Encounter: Payer: Self-pay | Admitting: Adult Health

## 2019-12-27 ENCOUNTER — Other Ambulatory Visit: Payer: Self-pay

## 2019-12-27 ENCOUNTER — Ambulatory Visit (INDEPENDENT_AMBULATORY_CARE_PROVIDER_SITE_OTHER): Payer: Medicare HMO | Admitting: Adult Health

## 2019-12-27 DIAGNOSIS — F411 Generalized anxiety disorder: Secondary | ICD-10-CM

## 2019-12-27 DIAGNOSIS — F331 Major depressive disorder, recurrent, moderate: Secondary | ICD-10-CM | POA: Diagnosis not present

## 2019-12-27 MED ORDER — LORAZEPAM 0.5 MG PO TABS: 0.5000 mg | ORAL_TABLET | Freq: Every day | ORAL | 2 refills | Status: DC | PRN

## 2019-12-27 MED ORDER — DESVENLAFAXINE SUCCINATE ER 50 MG PO TB24: 50.0000 mg | ORAL_TABLET | Freq: Every day | ORAL | 1 refills | Status: DC

## 2019-12-27 NOTE — Progress Notes (Signed)
Kimberly Conway [id number] 1956/06/01 64 y.o.  Subjective:   Patient ID:  Kimberly Conway is a 64 y.o. (DOB [birth year]) female.  Chief Complaint: No chief complaint on file.   HPI [name] presents to the office today for follow-up of MDD and GAD.  Describes mood today as "ok". Pleasant. Denies tearfulness. Mood symptoms - denies depression, anxiety, and irritability. Stating "I'm doing alight". Recently lost a family - had a period of grieving and feels like she is moving forward. Stable interest and motivation. Taking medications as prescribed and feels they work well.  Energy levels stable. Active, exercises 20 minutes a day. Plans to do more.  Enjoys some usual interests and activities. Single. Lives alone with 2 cats. No children. Has a sister local - sees her on Saturdays. Other family lives in [place], [place].  Appetite adequate. Weight 160 pounds, 61". Sleeps well most nights. Averages 7 to 8 hours. Focus and concentration stable. Completing tasks. Managing aspects of household. Works full time for a [employer] - 40 hours a week.  Denies SI or HI. Denies AH or VH.  Previous medication trials: Ativan, Pristiq   PHQ2-9     Office Visit from 09/21/2019 in [place] at [facility] from 06/22/2019 in [place] at [facility] Total Score 2 0  PHQ-9 Total Score 5 4       Review of Systems:  Review of Systems  Musculoskeletal: Negative for gait problem.  Neurological: Negative for tremors.  Psychiatric/Behavioral:       Please refer to HPI    Medications: I have reviewed the patient's current medications.  Current Outpatient Medications  Medication Sig Dispense Refill  . b complex vitamins tablet Take 1 tablet by mouth daily.    . Calcium 500-125 MG-UNIT TABS Take by mouth.    . Calcium Citrate (CITRACAL PO) Take 1,200 mg by mouth 3 (three) times daily.     [name] Kitchen desvenlafaxine (PRISTIQ) 50 MG 24 hr tablet Take 1 tablet (50 mg total) by  mouth daily. 90 tablet 1  . fluticasone (FLONASE) 50 MCG/ACT nasal spray Place 2 sprays into both nostrils daily. 16 g 2  . glucosamine-chondroitin 500-400 MG tablet Take 1 tablet by mouth daily.    [name] Kitchen LORazepam (ATIVAN) 0.5 MG tablet Take 1 tablet (0.5 mg total) by mouth daily as needed. 30 tablet 2  . metoprolol succinate (TOPROL-XL) 25 MG 24 hr tablet Take 25 mg by mouth. Take half tablet daily    . Multiple Vitamin (MULTIVITAMIN) tablet Take 1 tablet by mouth daily.    [name] Kitchen omega-3 fish oil (MAXEPA) 1000 MG CAPS capsule Take 1 capsule by mouth daily.     [name] Kitchen omeprazole (PRILOSEC) 20 MG capsule Take 1 capsule (20 mg total) by mouth 2 (two) times daily before a meal. 180 capsule 3  . OVER THE COUNTER MEDICATION Glucosamine 1500 mg  Chon 1200 mg    . Probiotic Product (PROBIOTIC-10 PO) Take by mouth.     No current facility-administered medications for this visit.    Medication Side Effects: None  Allergies: No Known Allergies  Past Medical History:  Diagnosis Date  . Depression   . GAD (generalized anxiety disorder)   . GERD (gastroesophageal reflux disease)   . HTN (hypertension)   . Multiple thyroid nodules   . Seasonal allergies     Family History  Problem Relation Age of Onset  . Diabetes Mother   . Breast cancer Mother   .  CAD Father   . Hypertension Sister   . Breast cancer Sister   . Diabetes Brother   . Colon cancer Neg Hx   . Esophageal cancer Neg Hx   . Pancreatic cancer Neg Hx   . Stomach cancer Neg Hx     Social History   Socioeconomic History  . Marital status: Single    Spouse name: Not on file  . Number of children: Not on file  . Years of education: Not on file  . Highest education level: Not on file  Occupational History  . Not on file  Tobacco Use  . Smoking status: Former Smoker    Types: Cigarettes    Quit date: 06/21/2014    Years since quitting: 5.5  . Smokeless tobacco: Never Used  Vaping Use  . Vaping Use: Never used  Substance and  Sexual Activity  . Alcohol use: Not Currently  . Drug use: Never  . Sexual activity: Not on file  Other Topics Concern  . Not on file  Social History Narrative  . Not on file   Social Determinants of Health   Financial Resource Strain:   . Difficulty of Paying Living Expenses:   Food Insecurity:   . Worried About Charity fundraiser in the Last Year:   . Arboriculturist in the Last Year:   Transportation Needs:   . Film/video editor (Medical):   [name] Kitchen Lack of Transportation (Non-Medical):   Physical Activity:   . Days of Exercise per Week:   . Minutes of Exercise per Session:   Stress:   . Feeling of Stress :   Social Connections:   . Frequency of Communication with Friends and Family:   . Frequency of Social Gatherings with Friends and Family:   . Attends Religious Services:   . Active Member of Clubs or Organizations:   . Attends Archivist Meetings:   [name] Kitchen Marital Status:   Intimate Partner Violence:   . Fear of Current or Ex-Partner:   . Emotionally Abused:   [name] Kitchen Physically Abused:   . Sexually Abused:     Past Medical History, Surgical history, Social history, and Family history were reviewed and updated as appropriate.   Please see review of systems for further details on the patient's review from today.   Objective:   Physical Exam:  There were no vitals taken for this visit.  Physical Exam Constitutional:      General: She is not in acute distress. Musculoskeletal:        General: No deformity.  Neurological:     Mental Status: She is alert and oriented to person, place, and time.     Coordination: Coordination normal.  Psychiatric:        Attention and Perception: Attention and perception normal. She does not perceive auditory or visual hallucinations.        Mood and Affect: Mood normal. Mood is not anxious or depressed. Affect is not labile, blunt, angry or inappropriate.        Speech: Speech normal.        Behavior: Behavior normal.         Thought Content: Thought content normal. Thought content is not paranoid or delusional. Thought content does not include homicidal or suicidal ideation. Thought content does not include homicidal or suicidal plan.        Cognition and Memory: Cognition and memory normal.        Judgment: Judgment normal.  Comments: Insight intact     Lab Review:     Component Value Date/Time   NA 138 09/21/2019 1132   K 4.0 09/21/2019 1132   CL 102 09/21/2019 1132   CO2 30 09/21/2019 1132   GLUCOSE 96 09/21/2019 1132   BUN 13 09/21/2019 1132   CREATININE 0.76 09/21/2019 1132   CALCIUM 9.3 09/21/2019 1132   PROT 7.1 09/21/2019 1132   ALBUMIN 4.4 09/21/2019 1132   AST 20 09/21/2019 1132   ALT 26 09/21/2019 1132   ALKPHOS 113 09/21/2019 1132   BILITOT 0.7 09/21/2019 1132       Component Value Date/Time   WBC 5.4 09/21/2019 1132   RBC 4.27 09/21/2019 1132   HGB 14.0 09/21/2019 1132   HCT 39.8 09/21/2019 1132   PLT 279.0 09/21/2019 1132   MCV 93.1 09/21/2019 1132   MCHC 35.2 09/21/2019 1132   RDW 12.5 09/21/2019 1132   LYMPHSABS 1.9 09/21/2019 1132   MONOABS 0.4 09/21/2019 1132   EOSABS 0.1 09/21/2019 1132   BASOSABS 0.0 09/21/2019 1132    No results found for: POCLITH, LITHIUM   No results found for: PHENYTOIN, PHENOBARB, VALPROATE, CBMZ   .res Assessment: Plan:    Plan:  PDMP reviewed  1. Pristiq 50mg  daily 2. Ativan 0.5mg  daily as needed  Read and reviewed note with patient for accuracy.   RTC 3 months  Patient advised to contact office with any questions, adverse effects, or acute worsening in signs and symptoms.  Discussed potential benefits, risk, and side effects of benzodiazepines to include potential risk of tolerance and dependence, as well as possible drowsiness.  Advised patient not to drive if experiencing drowsiness and to take lowest possible effective dose to minimize risk of dependence and tolerance.  Greater than 50% of face to face time with patient  was spent on counseling and coordination of care. We discussed medications and compliance. Also discussed taper off of Pristiq.   .Diagnoses and all orders for this visit:  Generalized anxiety disorder -     LORazepam (ATIVAN) 0.5 MG tablet; Take 1 tablet (0.5 mg total) by mouth daily as needed. -     desvenlafaxine (PRISTIQ) 50 MG 24 hr tablet; Take 1 tablet (50 mg total) by mouth daily.  Major depressive disorder, recurrent episode, moderate (HCC) -     desvenlafaxine (PRISTIQ) 50 MG 24 hr tablet; Take 1 tablet (50 mg total) by mouth daily.     Please see After Visit Summary for patient specific instructions.  No future appointments.  No orders of the defined types were placed in this encounter.   -------------------------------

## 2019-12-28 ENCOUNTER — Ambulatory Visit: Payer: Medicare HMO | Admitting: Adult Health

## 2020-02-05 ENCOUNTER — Other Ambulatory Visit: Payer: Self-pay

## 2020-02-05 ENCOUNTER — Other Ambulatory Visit: Payer: Self-pay | Admitting: Internal Medicine

## 2020-02-05 DIAGNOSIS — F411 Generalized anxiety disorder: Secondary | ICD-10-CM

## 2020-02-05 DIAGNOSIS — J309 Allergic rhinitis, unspecified: Secondary | ICD-10-CM

## 2020-02-05 MED ORDER — LORAZEPAM 0.5 MG PO TABS: 0.5000 mg | ORAL_TABLET | Freq: Every day | ORAL | 2 refills | Status: DC | PRN

## 2020-02-06 ENCOUNTER — Telehealth: Payer: Self-pay | Admitting: Internal Medicine

## 2020-02-06 MED ORDER — LEVOCETIRIZINE DIHYDROCHLORIDE 5 MG PO TABS: 5.0000 mg | ORAL_TABLET | Freq: Every evening | ORAL | 1 refills | Status: DC

## 2020-02-06 NOTE — Telephone Encounter (Signed)
Pt is calling in to see if she can get Rx Xyxal sent to [facility] she can get it for free b/c it is very expensive OTC.  Pt stated that if you have any questions you can call her.    Pt also wanted to know if she can get a referral for a ENT and she stated that she has not been seen by the provider for what she needs to see the ENT for.  Pt is aware that she needs to be seen by provider she declined and stated that she will find her own ENT.

## 2020-02-06 NOTE — Telephone Encounter (Signed)
Rx sent 

## 2020-02-06 NOTE — Telephone Encounter (Signed)
The patient is needing her allergy medication send to her insurance so she can get the medication free.  The patient doesn't know the exact name of the medication so she wants Apolonio Schneiders to contact her and she will have the medication bottle at hand by the time Apolonio Schneiders contacts her.  Please advise  [place], [place] Phone:  [phone]  Fax:  [phone]

## 2020-02-14 ENCOUNTER — Other Ambulatory Visit: Payer: Self-pay

## 2020-02-14 ENCOUNTER — Ambulatory Visit (INDEPENDENT_AMBULATORY_CARE_PROVIDER_SITE_OTHER): Payer: Medicare HMO | Admitting: Internal Medicine

## 2020-02-14 VITALS — BP 110/80 | HR 62 | Temp 98.2°F | Wt 163.7 lb

## 2020-02-14 DIAGNOSIS — F32A Depression, unspecified: Secondary | ICD-10-CM

## 2020-02-14 DIAGNOSIS — I10 Essential (primary) hypertension: Secondary | ICD-10-CM

## 2020-02-14 DIAGNOSIS — F329 Major depressive disorder, single episode, unspecified: Secondary | ICD-10-CM

## 2020-02-14 DIAGNOSIS — R42 Dizziness and giddiness: Secondary | ICD-10-CM | POA: Diagnosis not present

## 2020-02-14 DIAGNOSIS — K219 Gastro-esophageal reflux disease without esophagitis: Secondary | ICD-10-CM | POA: Diagnosis not present

## 2020-02-14 DIAGNOSIS — F331 Major depressive disorder, recurrent, moderate: Secondary | ICD-10-CM | POA: Diagnosis not present

## 2020-02-14 DIAGNOSIS — Z124 Encounter for screening for malignant neoplasm of cervix: Secondary | ICD-10-CM | POA: Diagnosis not present

## 2020-02-14 DIAGNOSIS — Z23 Encounter for immunization: Secondary | ICD-10-CM | POA: Diagnosis not present

## 2020-02-14 DIAGNOSIS — F419 Anxiety disorder, unspecified: Secondary | ICD-10-CM

## 2020-02-14 DIAGNOSIS — L2082 Flexural eczema: Secondary | ICD-10-CM | POA: Diagnosis not present

## 2020-02-14 DIAGNOSIS — H9193 Unspecified hearing loss, bilateral: Secondary | ICD-10-CM | POA: Diagnosis not present

## 2020-02-14 MED ORDER — TRIAMCINOLONE ACETONIDE 0.1 % EX CREA: 1.0000 "application " | TOPICAL_CREAM | Freq: Two times a day (BID) | CUTANEOUS | 0 refills | Status: DC

## 2020-02-14 MED ORDER — LEVOCETIRIZINE DIHYDROCHLORIDE 5 MG PO TABS: 5.0000 mg | ORAL_TABLET | Freq: Every evening | ORAL | 1 refills | Status: DC

## 2020-02-14 NOTE — Progress Notes (Signed)
Established Patient Office Visit     This visit occurred during the SARS-CoV-2 public health emergency.  Safety protocols were in place, including screening questions prior to the visit, additional usage of staff PPE, and extensive cleaning of exam room while observing appropriate contact time as indicated for disinfecting solutions.    CC/Reason for Visit: Discuss some acute concerns  HPI: Kimberly Conway is a 64 y.o. female who is coming in today for the above mentioned reasons. Past Medical History is significant for: Depression and anxiety followed by local psychiatry on Pristiq and as needed Ativan.  History of GERD with recent EGD and colonoscopy, seasonal allergies, history of thyroid nodules. Has been having some dizziness and hearing loss. Made appointment with audiology but is requesting ENT referral as well. Has been having some flexural eczema as well.   Past Medical/Surgical History: Past Medical History:  Diagnosis Date  . Depression   . GAD (generalized anxiety disorder)   . GERD (gastroesophageal reflux disease)   . HTN (hypertension)   . Multiple thyroid nodules   . Seasonal allergies     Past Surgical History:  Procedure Laterality Date  . APPENDECTOMY      Social History:  reports that she quit smoking about 5 years ago. Her smoking use included cigarettes. She has never used smokeless tobacco. She reports previous alcohol use. She reports that she does not use drugs.  Allergies: No Known Allergies  Family History:  Family History  Problem Relation Age of Onset  . Diabetes Mother   . Breast cancer Mother   . CAD Father   . Hypertension Sister   . Breast cancer Sister   . Diabetes Brother   . Colon cancer Neg Hx   . Esophageal cancer Neg Hx   . Pancreatic cancer Neg Hx   . Stomach cancer Neg Hx      Current Outpatient Medications:  .  b complex vitamins tablet, Take 1 tablet by mouth daily., Disp: , Rfl:  .  Calcium 500-125 MG-UNIT TABS, Take  by mouth., Disp: , Rfl:  .  Calcium Citrate (CITRACAL PO), Take 1,200 mg by mouth 3 (three) times daily. , Disp: , Rfl:  .  desvenlafaxine (PRISTIQ) 50 MG 24 hr tablet, Take 1 tablet (50 mg total) by mouth daily., Disp: 90 tablet, Rfl: 1 .  fluticasone (FLONASE) 50 MCG/ACT nasal spray, USE 2 SPRAYS IN EACH NOSTRIL EVERY DAY, Disp: 48 g, Rfl: 2 .  glucosamine-chondroitin 500-400 MG tablet, Take 1 tablet by mouth daily., Disp: , Rfl:  .  levocetirizine (XYZAL) 5 MG tablet, Take 1 tablet (5 mg total) by mouth every evening., Disp: 90 tablet, Rfl: 1 .  LORazepam (ATIVAN) 0.5 MG tablet, Take 1 tablet (0.5 mg total) by mouth daily as needed., Disp: 30 tablet, Rfl: 2 .  metoprolol succinate (TOPROL-XL) 25 MG 24 hr tablet, Take 25 mg by mouth. Take half tablet daily, Disp: , Rfl:  .  Multiple Vitamin (MULTIVITAMIN) tablet, Take 1 tablet by mouth daily., Disp: , Rfl:  .  omega-3 fish oil (MAXEPA) 1000 MG CAPS capsule, Take 1 capsule by mouth daily. , Disp: , Rfl:  .  omeprazole (PRILOSEC) 20 MG capsule, Take 1 capsule (20 mg total) by mouth 2 (two) times daily before a meal., Disp: 180 capsule, Rfl: 3 .  OVER THE COUNTER MEDICATION, Glucosamine 1500 mg  Chon 1200 mg, Disp: , Rfl:  .  Probiotic Product (PROBIOTIC-10 PO), Take by mouth., Disp: , Rfl:  .  triamcinolone cream (KENALOG) 0.1 %, Apply 1 application topically 2 (two) times daily., Disp: 30 g, Rfl: 0  Review of Systems:  Constitutional: Denies fever, chills, diaphoresis, appetite change and fatigue.  HEENT: Denies photophobia, eye pain, redness, hearing loss, ear pain, congestion, sore throat, rhinorrhea, sneezing, mouth sores, trouble swallowing, neck pain, neck stiffness and tinnitus.   Respiratory: Denies SOB, DOE, cough, chest tightness,  and wheezing.   Cardiovascular: Denies chest pain, palpitations and leg swelling.  Gastrointestinal: Denies nausea, vomiting, abdominal pain, diarrhea, constipation, blood in stool and abdominal distention.   Genitourinary: Denies dysuria, urgency, frequency, hematuria, flank pain and difficulty urinating.  Endocrine: Denies: hot or cold intolerance, sweats, changes in hair or nails, polyuria, polydipsia. Musculoskeletal: Denies myalgias, back pain, joint swelling, arthralgias and gait problem.  Skin: Denies pallor and wound.  Neurological: Denies dizziness, seizures, syncope, weakness, light-headedness, numbness and headaches.  Hematological: Denies adenopathy. Easy bruising, personal or family bleeding history  Psychiatric/Behavioral: Denies suicidal ideation, mood changes, confusion, nervousness, sleep disturbance and agitation    Physical Exam: Vitals:   02/14/20 0916  BP: 110/80  Pulse: 62  Temp: 98.2 F (36.8 C)  TempSrc: Oral  SpO2: 97%  Weight: 163 lb 11.2 oz (74.3 kg)    Body mass index is 30.93 kg/m.   Constitutional: NAD, calm, comfortable Eyes: PERRL, lids and conjunctivae normal ENMT: Mucous membranes are moist.  Tympanic membrane is pearly white, no erythema or bulging. Neck: normal, supple, no masses, no thyromegaly Respiratory: clear to auscultation bilaterally, no wheezing, no crackles. Normal respiratory effort. No accessory muscle use.  Cardiovascular: Regular rate and rhythm, no murmurs / rubs / gallops. No extremity edema.  Neurologic: grossly intact and non-focal Psychiatric: Normal judgment and insight. Alert and oriented x 3. Normal mood.    Impression and Plan:  Vertigo  Bilateral hearing loss, unspecified hearing loss type -Agree with ENT and audiology referral. -B12 normal recently.  Flexural eczema  - Plan: triamcinolone cream (KENALOG) 0.1 %  Anxiety and depression -Followed by psych.  Gastroesophageal reflux disease without esophagitis -Well controlled on daily PPI therapy.  Essential hypertension -Well controlled.  Need for flu vaccine -Flu vaccine administered today.    Patient Instructions  -Nice seeing you  today!!  -Triamcinolone cream as needed for eczema.  -B12 level was normal at 825 earlier this year.  -Flu vaccine today.  -See you back in 6 months or sooner as needed.     Lelon Frohlich, MD [facility]

## 2020-02-14 NOTE — Addendum Note (Signed)
Addended by: Westley Hummer B on: 02/14/2020 09:43 AM   Modules accepted: Orders

## 2020-02-14 NOTE — Addendum Note (Signed)
Addended by: Westley Hummer B on: 02/14/2020 04:26 PM   Modules accepted: Orders

## 2020-02-14 NOTE — Patient Instructions (Signed)
-  Nice seeing you today!!  -Triamcinolone cream as needed for eczema.  -B12 level was normal at 825 earlier this year.  -Flu vaccine today.  -See you back in 6 months or sooner as needed.

## 2020-02-26 ENCOUNTER — Encounter: Payer: Self-pay | Admitting: Internal Medicine

## 2020-03-18 ENCOUNTER — Other Ambulatory Visit: Payer: Self-pay | Admitting: Internal Medicine

## 2020-03-18 DIAGNOSIS — J309 Allergic rhinitis, unspecified: Secondary | ICD-10-CM

## 2020-03-22 ENCOUNTER — Other Ambulatory Visit: Payer: Self-pay

## 2020-03-22 ENCOUNTER — Encounter (INDEPENDENT_AMBULATORY_CARE_PROVIDER_SITE_OTHER): Payer: Self-pay | Admitting: Otolaryngology

## 2020-03-22 ENCOUNTER — Ambulatory Visit (INDEPENDENT_AMBULATORY_CARE_PROVIDER_SITE_OTHER): Payer: Medicare HMO | Admitting: Otolaryngology

## 2020-03-22 VITALS — Temp 98.1°F

## 2020-03-22 DIAGNOSIS — H903 Sensorineural hearing loss, bilateral: Secondary | ICD-10-CM | POA: Diagnosis not present

## 2020-03-22 DIAGNOSIS — J310 Chronic rhinitis: Secondary | ICD-10-CM | POA: Diagnosis not present

## 2020-03-22 DIAGNOSIS — Z87898 Personal history of other specified conditions: Secondary | ICD-10-CM

## 2020-03-22 NOTE — Progress Notes (Signed)
HPI: Kimberly Conway is a 64 y.o. female who presents is referred by her PCP for evaluation of vertigo, intermittent nasal congestion and decreased hearing.  She also states that she has decreased taste and smell prior to the Covid epidemic.  She is presently using Flonase which seems to help some with the nasal congestion as well as antihistamine at night before she goes to bed at this makes her little bit sleepy.  She sometimes wakes up stuffy in the mornings.  She generally uses her Flonase in the mornings.  She apparently describes having vertigo for a number of years over 10 years ago.  She describes one episode of vertigo about 3 weeks ago when she was at her dentist office and the vertigo lasted for well over an hour to the point where one of her family members had to drive her home.  She did not notice any significant change in her hearing but is always had decreased hearing.  She has not previously had hearing test performed.  She does not describe any vertigo at night when she turns over in bed.. She apparently has had problems with wax buildup in her ears in the past.  Past Medical History:  Diagnosis Date  . Depression   . GAD (generalized anxiety disorder)   . GERD (gastroesophageal reflux disease)   . HTN (hypertension)   . Multiple thyroid nodules   . Seasonal allergies    Past Surgical History:  Procedure Laterality Date  . APPENDECTOMY     Social History   Socioeconomic History  . Marital status: Single    Spouse name: Not on file  . Number of children: Not on file  . Years of education: Not on file  . Highest education level: Not on file  Occupational History  . Not on file  Tobacco Use  . Smoking status: Former Smoker    Types: Cigarettes    Quit date: 06/21/2014    Years since quitting: 5.7  . Smokeless tobacco: Never Used  Vaping Use  . Vaping Use: Never used  Substance and Sexual Activity  . Alcohol use: Not Currently  . Drug use: Never  . Sexual activity: Not  on file  Other Topics Concern  . Not on file  Social History Narrative  . Not on file   Social Determinants of Health   Financial Resource Strain:   . Difficulty of Paying Living Expenses: Not on file  Food Insecurity:   . Worried About Charity fundraiser in the Last Year: Not on file  . Ran Out of Food in the Last Year: Not on file  Transportation Needs:   . Lack of Transportation (Medical): Not on file  . Lack of Transportation (Non-Medical): Not on file  Physical Activity:   . Days of Exercise per Week: Not on file  . Minutes of Exercise per Session: Not on file  Stress:   . Feeling of Stress : Not on file  Social Connections:   . Frequency of Communication with Friends and Family: Not on file  . Frequency of Social Gatherings with Friends and Family: Not on file  . Attends Religious Services: Not on file  . Active Member of Clubs or Organizations: Not on file  . Attends Archivist Meetings: Not on file  . Marital Status: Not on file   Family History  Problem Relation Age of Onset  . Diabetes Mother   . Breast cancer Mother   . CAD Father   .  Hypertension Sister   . Breast cancer Sister   . Diabetes Brother   . Colon cancer Neg Hx   . Esophageal cancer Neg Hx   . Pancreatic cancer Neg Hx   . Stomach cancer Neg Hx    No Known Allergies Prior to Admission medications   Medication Sig Start Date End Date Taking? Authorizing Provider  b complex vitamins tablet Take 1 tablet by mouth daily.   Yes [provider]  Calcium 500-125 MG-UNIT TABS Take by mouth.   Yes [provider]  Calcium Citrate (CITRACAL PO) Take 1,200 mg by mouth 3 (three) times daily.    Yes [provider]  desvenlafaxine (PRISTIQ) 50 MG 24 hr tablet Take 1 tablet (50 mg total) by mouth daily. 12/27/19  Yes Mozingo, Berdie Ogren, NP  fluticasone [facility]) 50 MCG/ACT nasal spray USE 2 SPRAYS IN [facility] NOSTRIL EVERY DAY 03/20/20  Yes Isaac Bliss, Rayford Halsted, MD   glucosamine-chondroitin 500-400 MG tablet Take 1 tablet by mouth daily.   Yes [provider]  levocetirizine (XYZAL) 5 MG tablet Take 1 tablet (5 mg total) by mouth every evening. 02/14/20  Yes Isaac Bliss, Rayford Halsted, MD  LORazepam (ATIVAN) 0.5 MG tablet Take 1 tablet (0.5 mg total) by mouth daily as needed. 02/05/20  Yes Mozingo, Berdie Ogren, NP  metoprolol succinate (TOPROL-XL) 25 MG 24 hr tablet Take 25 mg by mouth. Take half tablet daily 01/10/18  Yes [provider]  Multiple Vitamin (MULTIVITAMIN) tablet Take 1 tablet by mouth daily.   Yes [provider]  omega-3 fish oil (MAXEPA) 1000 MG CAPS capsule Take 1 capsule by mouth daily.    Yes [provider]  omeprazole (PRILOSEC) 20 MG capsule Take 1 capsule (20 mg total) by mouth 2 (two) times daily before a meal. 10/24/19  Yes Thornton Park, MD  OVER THE COUNTER MEDICATION Glucosamine 1500 mg  Chon 1200 mg   Yes [provider]  Probiotic Product (PROBIOTIC-10 PO) Take by mouth.   Yes [provider]  triamcinolone cream (KENALOG) 0.1 % Apply 1 application topically 2 (two) times daily. 02/14/20  Yes Erline Hau, MD     Positive ROS: Otherwise negative  All other systems have been reviewed and were otherwise negative with the exception of those mentioned in the HPI and as above.  Physical Exam: Constitutional: Alert, well-appearing, no acute distress Ears: External ears without lesions or tenderness. Ear canals are clear bilaterally with no significant wax buildup.  TMs are clear bilaterally with good mobility on pneumatic otoscopy.  Hearing screening with a 512 1024 tuning fork revealed fairly symmetric hearing with moderate decreased hearing with a 1024 tuning fork in both ears.  Dix-Hallpike testing revealed no evidence of BPPV. Nasal: External nose without lesions. Septum relatively midline with moderate rhinitis.  After decongesting the nose both nasal  passages were clear with no polyps.  Both middle meatus regions were clear with no evidence of mucopurulent discharge or infection..  Oral: Lips and gums without lesions. Tongue and palate mucosa without lesions. Posterior oropharynx clear.  Tongue is normal to evaluation. Neck: No palpable adenopathy or masses Respiratory: Breathing comfortably  Skin: No facial/neck lesions or rash noted. Neuro exam: She is having no dizzy episodes presently and no balance problems with normal gait.  Procedures  Assessment: Chronic rhinitis Bilateral sensorineural hearing loss  Plan: We will have patient obtain audiologic testing and have her fax the hearing test so I can review this.  She  was interested in getting an MRI scan of her head although I do not feel like this will be indicated unless she had a symmetric sensorineural hearing loss. Concerning her nasal obstruction would recommend regular use of the Flonase 2 sprays each nostril and perhaps change it to nighttime dose versus a.m. dose or she could possibly use it twice daily when nasal congestion is bad temporarily for 4 to 5 days. She will call us back after the audiologic testing to review this on the phone.  She will return earlier if she has any other episodes of vertigo for recheck.   Radene Journey, MD   CC:

## 2020-03-28 ENCOUNTER — Ambulatory Visit: Payer: Medicare HMO | Admitting: Adult Health

## 2020-04-04 ENCOUNTER — Other Ambulatory Visit: Payer: Self-pay

## 2020-04-04 ENCOUNTER — Ambulatory Visit (INDEPENDENT_AMBULATORY_CARE_PROVIDER_SITE_OTHER): Payer: Medicare HMO | Admitting: Adult Health

## 2020-04-04 ENCOUNTER — Encounter: Payer: Self-pay | Admitting: Adult Health

## 2020-04-04 DIAGNOSIS — F331 Major depressive disorder, recurrent, moderate: Secondary | ICD-10-CM | POA: Diagnosis not present

## 2020-04-04 DIAGNOSIS — F411 Generalized anxiety disorder: Secondary | ICD-10-CM | POA: Diagnosis not present

## 2020-04-04 MED ORDER — DESVENLAFAXINE SUCCINATE ER 50 MG PO TB24: 50.0000 mg | ORAL_TABLET | Freq: Every day | ORAL | 3 refills | Status: DC

## 2020-04-04 NOTE — Progress Notes (Signed)
BLONDIE RIGGSBEE [id number] September 05, 1955 64 y.o.  Subjective:   Patient ID:  Kimberly Conway is a 64 y.o. (DOB [birth year]) female.  Chief Complaint: No chief complaint on file.   HPI Maddeline A Mikesell presents to the office today for follow-up of MDD and GAD.  Describes mood today as "ok". Pleasant. Denies tearfulness. Mood symptoms - denies depression, anxiety, and irritability. Stating "I'm doing pretty good". Discussed a situation involving a Mudlogger. Stable interest and motivation. Taking medications as prescribed..  Energy levels stable. Active, has a regular exercise routine.  Enjoys some usual interests and activities. Single. Lives alone with 2 cats. No children. Has a sister local - sees her on Saturdays. Other family lives in [place], [place].  Appetite adequate. Weight 160 pounds, 61". Sleeps well most nights. Averages 7 hours. Focus and concentration stable. Completing tasks. Managing aspects of household. Works full time for a [employer] - 40 hours a week.  Denies SI or HI. Denies AH or VH.  Previous medication trials: Ativan, Pristiq  PHQ2-9     Office Visit from 09/21/2019 in [place] at [facility] from 06/22/2019 in [place] at [facility] Total Score 2 0  PHQ-9 Total Score 5 4       Review of Systems:  Review of Systems  Musculoskeletal: Negative for gait problem.  Neurological: Negative for tremors.  Psychiatric/Behavioral:       Please refer to HPI    Medications: I have reviewed the patient's current medications.  Current Outpatient Medications  Medication Sig Dispense Refill  . b complex vitamins tablet Take 1 tablet by mouth daily.    . Calcium 500-125 MG-UNIT TABS Take by mouth.    . Calcium Citrate (CITRACAL PO) Take 1,200 mg by mouth 3 (three) times daily.     [name] Kitchen desvenlafaxine (PRISTIQ) 50 MG 24 hr tablet Take 1 tablet (50 mg total) by mouth daily. 90 tablet 3  . fluticasone (FLONASE) 50 MCG/ACT nasal spray USE 2 SPRAYS  IN EACH NOSTRIL EVERY DAY 48 g 2  . glucosamine-chondroitin 500-400 MG tablet Take 1 tablet by mouth daily.    [name] Kitchen levocetirizine (XYZAL) 5 MG tablet Take 1 tablet (5 mg total) by mouth every evening. 90 tablet 1  . LORazepam (ATIVAN) 0.5 MG tablet Take 1 tablet (0.5 mg total) by mouth daily as needed. 30 tablet 2  . metoprolol succinate (TOPROL-XL) 25 MG 24 hr tablet Take 25 mg by mouth. Take half tablet daily    . Multiple Vitamin (MULTIVITAMIN) tablet Take 1 tablet by mouth daily.    [name] Kitchen omega-3 fish oil (MAXEPA) 1000 MG CAPS capsule Take 1 capsule by mouth daily.     [name] Kitchen omeprazole (PRILOSEC) 20 MG capsule Take 1 capsule (20 mg total) by mouth 2 (two) times daily before a meal. 180 capsule 3  . OVER THE COUNTER MEDICATION Glucosamine 1500 mg  Chon 1200 mg    . Probiotic Product (PROBIOTIC-10 PO) Take by mouth.    . triamcinolone cream (KENALOG) 0.1 % Apply 1 application topically 2 (two) times daily. 30 g 0   No current facility-administered medications for this visit.    Medication Side Effects: None  Allergies: No Known Allergies  Past Medical History:  Diagnosis Date  . Depression   . GAD (generalized anxiety disorder)   . GERD (gastroesophageal reflux disease)   . HTN (hypertension)   . Multiple thyroid nodules   . Seasonal allergies     Family History  Problem  Relation Age of Onset  . Diabetes Mother   . Breast cancer Mother   . CAD Father   . Hypertension Sister   . Breast cancer Sister   . Diabetes Brother   . Colon cancer Neg Hx   . Esophageal cancer Neg Hx   . Pancreatic cancer Neg Hx   . Stomach cancer Neg Hx     Social History   Socioeconomic History  . Marital status: Single    Spouse name: Not on file  . Number of children: Not on file  . Years of education: Not on file  . Highest education level: Not on file  Occupational History  . Not on file  Tobacco Use  . Smoking status: Former Smoker    Types: Cigarettes    Quit date: 06/21/2014    Years  since quitting: 5.7  . Smokeless tobacco: Never Used  Vaping Use  . Vaping Use: Never used  Substance and Sexual Activity  . Alcohol use: Not Currently  . Drug use: Never  . Sexual activity: Not on file  Other Topics Concern  . Not on file  Social History Narrative  . Not on file   Social Determinants of Health   Financial Resource Strain:   . Difficulty of Paying Living Expenses: Not on file  Food Insecurity:   . Worried About Charity fundraiser in the Last Year: Not on file  . Ran Out of Food in the Last Year: Not on file  Transportation Needs:   . Lack of Transportation (Medical): Not on file  . Lack of Transportation (Non-Medical): Not on file  Physical Activity:   . Days of Exercise per Week: Not on file  . Minutes of Exercise per Session: Not on file  Stress:   . Feeling of Stress : Not on file  Social Connections:   . Frequency of Communication with Friends and Family: Not on file  . Frequency of Social Gatherings with Friends and Family: Not on file  . Attends Religious Services: Not on file  . Active Member of Clubs or Organizations: Not on file  . Attends Archivist Meetings: Not on file  . Marital Status: Not on file  Intimate Partner Violence:   . Fear of Current or Ex-Partner: Not on file  . Emotionally Abused: Not on file  . Physically Abused: Not on file  . Sexually Abused: Not on file    Past Medical History, Surgical history, Social history, and Family history were reviewed and updated as appropriate.   Please see review of systems for further details on the patient's review from today.   Objective:   Physical Exam:  There were no vitals taken for this visit.  Physical Exam Constitutional:      General: She is not in acute distress. Musculoskeletal:        General: No deformity.  Neurological:     Mental Status: She is alert and oriented to person, place, and time.     Coordination: Coordination normal.  Psychiatric:         Attention and Perception: Attention and perception normal. She does not perceive auditory or visual hallucinations.        Mood and Affect: Mood normal. Mood is not anxious or depressed. Affect is not labile, blunt, angry or inappropriate.        Speech: Speech normal.        Behavior: Behavior normal.        Thought Content: Thought content  normal. Thought content is not paranoid or delusional. Thought content does not include homicidal or suicidal ideation. Thought content does not include homicidal or suicidal plan.        Cognition and Memory: Cognition and memory normal.        Judgment: Judgment normal.     Comments: Insight intact     Lab Review:     Component Value Date/Time   NA 138 09/21/2019 1132   K 4.0 09/21/2019 1132   CL 102 09/21/2019 1132   CO2 30 09/21/2019 1132   GLUCOSE 96 09/21/2019 1132   BUN 13 09/21/2019 1132   CREATININE 0.76 09/21/2019 1132   CALCIUM 9.3 09/21/2019 1132   PROT 7.1 09/21/2019 1132   ALBUMIN 4.4 09/21/2019 1132   AST 20 09/21/2019 1132   ALT 26 09/21/2019 1132   ALKPHOS 113 09/21/2019 1132   BILITOT 0.7 09/21/2019 1132       Component Value Date/Time   WBC 5.4 09/21/2019 1132   RBC 4.27 09/21/2019 1132   HGB 14.0 09/21/2019 1132   HCT 39.8 09/21/2019 1132   PLT 279.0 09/21/2019 1132   MCV 93.1 09/21/2019 1132   MCHC 35.2 09/21/2019 1132   RDW 12.5 09/21/2019 1132   LYMPHSABS 1.9 09/21/2019 1132   MONOABS 0.4 09/21/2019 1132   EOSABS 0.1 09/21/2019 1132   BASOSABS 0.0 09/21/2019 1132    No results found for: POCLITH, LITHIUM   No results found for: PHENYTOIN, PHENOBARB, VALPROATE, CBMZ   .res Assessment: Plan:    Plan:  PDMP reviewed  1. Pristiq 50mg  daily 2. Ativan 0.5mg  daily as needed  Read and reviewed note with patient for accuracy.   RTC 3 months  Patient advised to contact office with any questions, adverse effects, or acute worsening in signs and symptoms.  Discussed potential benefits, risk, and side  effects of benzodiazepines to include potential risk of tolerance and dependence, as well as possible drowsiness.  Advised patient not to drive if experiencing drowsiness and to take lowest possible effective dose to minimize risk of dependence and tolerance.   Diagnoses and all orders for this visit:  Major depressive disorder, recurrent episode, moderate (HCC) -     desvenlafaxine (PRISTIQ) 50 MG 24 hr tablet; Take 1 tablet (50 mg total) by mouth daily.  Generalized anxiety disorder -     desvenlafaxine (PRISTIQ) 50 MG 24 hr tablet; Take 1 tablet (50 mg total) by mouth daily.     Please see After Visit Summary for patient specific instructions.  No future appointments.  No orders of the defined types were placed in this encounter.   -------------------------------

## 2020-04-19 DIAGNOSIS — R011 Cardiac murmur, unspecified: Secondary | ICD-10-CM | POA: Diagnosis not present

## 2020-04-19 DIAGNOSIS — Z6831 Body mass index (BMI) 31.0-31.9, adult: Secondary | ICD-10-CM | POA: Diagnosis not present

## 2020-04-19 DIAGNOSIS — Z1231 Encounter for screening mammogram for malignant neoplasm of breast: Secondary | ICD-10-CM | POA: Diagnosis not present

## 2020-04-19 DIAGNOSIS — Z124 Encounter for screening for malignant neoplasm of cervix: Secondary | ICD-10-CM | POA: Diagnosis not present

## 2020-04-24 ENCOUNTER — Telehealth: Payer: Self-pay

## 2020-04-24 NOTE — Telephone Encounter (Signed)
NOTES ON FILE FROM Elzie Rings ADKINS [phone] SENT REFERRAL TO SCHEDULING

## 2020-05-01 ENCOUNTER — Ambulatory Visit (INDEPENDENT_AMBULATORY_CARE_PROVIDER_SITE_OTHER): Payer: Medicare HMO | Admitting: Family Medicine

## 2020-05-01 ENCOUNTER — Encounter: Payer: Self-pay | Admitting: Family Medicine

## 2020-05-01 ENCOUNTER — Other Ambulatory Visit: Payer: Self-pay

## 2020-05-01 VITALS — BP 110/70 | HR 64 | Temp 98.1°F | Wt 166.3 lb

## 2020-05-01 DIAGNOSIS — R202 Paresthesia of skin: Secondary | ICD-10-CM | POA: Diagnosis not present

## 2020-05-01 NOTE — Patient Instructions (Signed)
Paresthesia Paresthesia is an abnormal burning or prickling sensation. It is usually felt in the hands, arms, legs, or feet. However, it may occur in any part of the body. Usually, paresthesia is not painful. It may feel like:  Tingling or numbness.  Buzzing.  Itching. Paresthesia may occur without any clear cause, or it may be caused by:  Breathing too quickly (hyperventilation).  Pressure on a nerve.  An underlying medical condition.  Side effects of a medication.  Nutritional deficiencies.  Exposure to toxic chemicals. Most people experience temporary (transient) paresthesia at some time in their lives. For some people, it may be long-lasting (chronic) because of an underlying medical condition. If you have paresthesia that lasts a long time, you may need to be evaluated by your health care provider. Follow these instructions at home: Alcohol use   Do not drink alcohol if: ? Your health care provider tells you not to drink. ? You are pregnant, may be pregnant, or are planning to become pregnant.  If you drink alcohol: ? Limit how much you use to:  0-1 drink a day for women.  0-2 drinks a day for men. ? Be aware of how much alcohol is in your drink. In the [place] one drink equals one 12 oz bottle of beer (355 mL), one 5 oz glass of wine (148 mL), or one 1 oz glass of hard liquor (44 mL). Nutrition   Eat a healthy diet. This includes: ? Eating foods that are high in fiber, such as fresh fruits and vegetables, whole grains, and beans. ? Limiting foods that are high in fat and processed sugars, such as fried or sweet foods. General instructions  Take over-the-counter and prescription medicines only as told by your health care provider.  Do not use any products that contain nicotine or tobacco, such as cigarettes and e-cigarettes. These can keep blood from reaching damaged nerves. If you need help quitting, ask your health care provider.  If you have diabetes, work  closely with your health care provider to keep your blood sugar under control.  If you have numbness in your feet: ? Check every day for signs of injury or infection. Watch for redness, warmth, and swelling. ? Wear padded socks and comfortable shoes. These help protect your feet.  Keep all follow-up visits as told by your health care provider. This is important. Contact a health care provider if you:  Have paresthesia that gets worse or does not go away.  Have a burning or prickling feeling that gets worse when you walk.  Have pain, cramps, or dizziness.  Develop a rash. Get help right away if you:  Feel weak.  Have trouble walking or moving.  Have problems with speech, understanding, or vision.  Feel confused.  Cannot control your bladder or bowel movements.  Have numbness after an injury.  Develop new weakness in an arm or leg.  Faint. Summary  Paresthesia is an abnormal burning or prickling sensation that is usually felt in the hands, arms, legs, or feet. It may also occur in other parts of the body.  Paresthesia may occur without any clear cause, or it may be caused by breathing too quickly (hyperventilation), pressure on a nerve, an underlying medical condition, side effects of a medication, nutritional deficiencies, or exposure to toxic chemicals.  If you have paresthesia that lasts a long time, you may need to be evaluated by your health care provider. This information is not intended to replace advice given to you by   your health care provider. Make sure you discuss any questions you have with your health care provider. Document Revised: 06/20/2018 Document Reviewed: 06/03/2017 Elsevier Patient Education  2020 [employer]  

## 2020-05-01 NOTE — Progress Notes (Signed)
Established Patient Office Visit  Subjective:  Patient ID: Kimberly Conway, female    DOB: 1955/06/10  Age: 64 y.o. MRN: [id number]  CC:  Chief Complaint  Patient presents with  . Numbness    fingers and lips    HPI Kimberly Conway presents for chief complaint of some tingling and numbness involving her fingertips of both hands involving all digits with onset this past Monday.  She states last Thursday she got her [employer] booster.  She also describes some numbness involving her upper and lower lips.  She does not describe any significant angioedema symptoms.  No recent neck pain.  She denies any lower extremity symptoms.  No weakness.  Denies facial weakness.  She does take chronic PPI with omeprazole 20 mg daily.  Last April she had A1c of 4.9%, normal thyroid functions, and B12 level of 825.  She denies any recent appetite changes, weight loss, night sweats, back pain  Past Medical History:  Diagnosis Date  . Depression   . GAD (generalized anxiety disorder)   . GERD (gastroesophageal reflux disease)   . HTN (hypertension)   . Multiple thyroid nodules   . Seasonal allergies     Past Surgical History:  Procedure Laterality Date  . APPENDECTOMY      Family History  Problem Relation Age of Onset  . Diabetes Mother   . Breast cancer Mother   . CAD Father   . Hypertension Sister   . Breast cancer Sister   . Diabetes Brother   . Colon cancer Neg Hx   . Esophageal cancer Neg Hx   . Pancreatic cancer Neg Hx   . Stomach cancer Neg Hx     Social History   Socioeconomic History  . Marital status: Single    Spouse name: Not on file  . Number of children: Not on file  . Years of education: Not on file  . Highest education level: Not on file  Occupational History  . Not on file  Tobacco Use  . Smoking status: Former Smoker    Types: Cigarettes    Quit date: 06/21/2014    Years since quitting: 5.8  . Smokeless tobacco: Never Used  Vaping Use  . Vaping Use: Never used   Substance and Sexual Activity  . Alcohol use: Not Currently  . Drug use: Never  . Sexual activity: Not on file  Other Topics Concern  . Not on file  Social History Narrative  . Not on file   Social Determinants of Health   Financial Resource Strain:   . Difficulty of Paying Living Expenses: Not on file  Food Insecurity:   . Worried About Charity fundraiser in the Last Year: Not on file  . Ran Out of Food in the Last Year: Not on file  Transportation Needs:   . Lack of Transportation (Medical): Not on file  . Lack of Transportation (Non-Medical): Not on file  Physical Activity:   . Days of Exercise per Week: Not on file  . Minutes of Exercise per Session: Not on file  Stress:   . Feeling of Stress : Not on file  Social Connections:   . Frequency of Communication with Friends and Family: Not on file  . Frequency of Social Gatherings with Friends and Family: Not on file  . Attends Religious Services: Not on file  . Active Member of Clubs or Organizations: Not on file  . Attends Archivist Meetings: Not on file  .  Marital Status: Not on file  Intimate Partner Violence:   . Fear of Current or Ex-Partner: Not on file  . Emotionally Abused: Not on file  . Physically Abused: Not on file  . Sexually Abused: Not on file    Outpatient Medications Prior to Visit  Medication Sig Dispense Refill  . b complex vitamins tablet Take 1 tablet by mouth daily.    . Calcium 500-125 MG-UNIT TABS Take by mouth.    . Calcium Citrate (CITRACAL PO) Take 1,200 mg by mouth 3 (three) times daily.     [name] Kitchen desvenlafaxine (PRISTIQ) 50 MG 24 hr tablet Take 1 tablet (50 mg total) by mouth daily. 90 tablet 3  . fluticasone (FLONASE) 50 MCG/ACT nasal spray USE 2 SPRAYS IN EACH NOSTRIL EVERY DAY 48 g 2  . glucosamine-chondroitin 500-400 MG tablet Take 1 tablet by mouth daily.    [name] Kitchen levocetirizine (XYZAL) 5 MG tablet Take 1 tablet (5 mg total) by mouth every evening. 90 tablet 1  . LORazepam  (ATIVAN) 0.5 MG tablet Take 1 tablet (0.5 mg total) by mouth daily as needed. 30 tablet 2  . metoprolol succinate (TOPROL-XL) 25 MG 24 hr tablet Take 25 mg by mouth. Take half tablet daily    . Multiple Vitamin (MULTIVITAMIN) tablet Take 1 tablet by mouth daily.    [name] Kitchen omega-3 fish oil (MAXEPA) 1000 MG CAPS capsule Take 1 capsule by mouth daily.     [name] Kitchen omeprazole (PRILOSEC) 20 MG capsule Take 1 capsule (20 mg total) by mouth 2 (two) times daily before a meal. 180 capsule 3  . OVER THE COUNTER MEDICATION Glucosamine 1500 mg  Chon 1200 mg    . Probiotic Product (PROBIOTIC-10 PO) Take by mouth.    . triamcinolone cream (KENALOG) 0.1 % Apply 1 application topically 2 (two) times daily. 30 g 0   No facility-administered medications prior to visit.    No Known Allergies  ROS Review of Systems  Constitutional: Negative for appetite change, chills, fever and unexpected weight change.  Respiratory: Negative for cough and shortness of breath.   Cardiovascular: Negative for chest pain.  Genitourinary: Negative for dysuria.  Neurological: Positive for numbness. Negative for dizziness, syncope, facial asymmetry, speech difficulty, weakness, light-headedness and headaches.  Hematological: Negative for adenopathy. Does not bruise/bleed easily.      Objective:    Physical Exam Vitals reviewed.  Constitutional:      Appearance: Normal appearance.  HENT:     Mouth/Throat:     Pharynx: Oropharynx is clear. No posterior oropharyngeal erythema.  Cardiovascular:     Rate and Rhythm: Normal rate and regular rhythm.  Pulmonary:     Effort: Pulmonary effort is normal.     Breath sounds: Normal breath sounds.  Neurological:     General: No focal deficit present.     Mental Status: She is alert and oriented to person, place, and time.     Cranial Nerves: No cranial nerve deficit.     Motor: No weakness.     Coordination: Coordination normal.     Gait: Gait normal.     Deep Tendon Reflexes: Reflexes  normal.     Comments: She does not have any obvious facial weakness.  No extremity weakness.  She has symmetric upper extremity reflexes.  She has intact sensation in all digits with monofilament testing.     BP 110/70 (BP Location: Left Arm, Patient Position: Sitting, Cuff Size: Large)   Pulse 64   Temp 98.1 F (36.7 C) (  Oral)   Wt 166 lb 4.8 oz (75.4 kg)   SpO2 94%   BMI 31.42 kg/m  Wt Readings from Last 3 Encounters:  05/01/20 166 lb 4.8 oz (75.4 kg)  02/14/20 163 lb 11.2 oz (74.3 kg)  10/24/19 163 lb 12.8 oz (74.3 kg)     Health Maintenance Due  Topic Date Due  . Hepatitis C Screening  Never done  . DEXA SCAN  Never done    There are no preventive care reminders to display for this patient.  Lab Results  Component Value Date   TSH 2.51 09/21/2019   Lab Results  Component Value Date   WBC 5.4 09/21/2019   HGB 14.0 09/21/2019   HCT 39.8 09/21/2019   MCV 93.1 09/21/2019   PLT 279.0 09/21/2019   Lab Results  Component Value Date   NA 138 09/21/2019   K 4.0 09/21/2019   CO2 30 09/21/2019   GLUCOSE 96 09/21/2019   BUN 13 09/21/2019   CREATININE 0.76 09/21/2019   BILITOT 0.7 09/21/2019   ALKPHOS 113 09/21/2019   AST 20 09/21/2019   ALT 26 09/21/2019   PROT 7.1 09/21/2019   ALBUMIN 4.4 09/21/2019   CALCIUM 9.3 09/21/2019   GFR 76.25 09/21/2019   Lab Results  Component Value Date   CHOL 199 09/21/2019   Lab Results  Component Value Date   HDL 53.10 09/21/2019   Lab Results  Component Value Date   LDLCALC 112 (H) 09/21/2019   Lab Results  Component Value Date   TRIG 167.0 (H) 09/21/2019   Lab Results  Component Value Date   CHOLHDL 4 09/21/2019   Lab Results  Component Value Date   HGBA1C 4.9 09/21/2019      Assessment & Plan:   Problem List Items Addressed This Visit    None    Visit Diagnoses    Paresthesias    -  Primary   Relevant Orders   Multiple Myeloma Panel (SPEP&IFE w/QIG)    Patient describes few day history of  paresthesias involving both hands.  She had recent Covid vaccine booster with [employer].  Denies any other obvious side effects.  No evidence for angioedema.  She does take chronic PPI but had B12 levels that were normal last spring.  No history of diabetes.  Thyroid functions recently were normal.  Does not describe any red flag symptoms such as appetite changes, weight loss, night sweats, etc.  -We will check multiple myeloma panel -If normal observe for now.  Follow-up with her primary for any progressive symptoms, persistent symptoms or any new symptoms such as weakness  No orders of the defined types were placed in this encounter.   Follow-up: No follow-ups on file.    Carolann Littler, MD

## 2020-05-04 ENCOUNTER — Encounter: Payer: Self-pay | Admitting: Family Medicine

## 2020-05-07 LAB — MULTIPLE MYELOMA PANEL, SERUM
Albumin SerPl Elph-Mcnc: 3.8 g/dL (ref 2.9–4.4)
Albumin/Glob SerPl: 1.1 (ref 0.7–1.7)
Alpha 1: 0.3 g/dL (ref 0.0–0.4)
Alpha2 Glob SerPl Elph-Mcnc: 0.7 g/dL (ref 0.4–1.0)
B-Globulin SerPl Elph-Mcnc: 1.2 g/dL (ref 0.7–1.3)
Gamma Glob SerPl Elph-Mcnc: 1.6 g/dL (ref 0.4–1.8)
Globulin, Total: 3.7 g/dL (ref 2.2–3.9)
IgA/Immunoglobulin A, Serum: 297 mg/dL (ref 87–352)
IgG (Immunoglobin G), Serum: 1286 mg/dL (ref 586–1602)
IgM (Immunoglobulin M), Srm: 172 mg/dL (ref 26–217)
Total Protein: 7.5 g/dL (ref 6.0–8.5)

## 2020-05-07 LAB — SPECIMEN STATUS REPORT

## 2020-05-08 NOTE — Progress Notes (Signed)
No evidence for multiple myeloma from electrophoresis.  We were checking this to rule out other sources of possible neuropathy.  Recommend continued follow-up with Dr. Jerilee Hoh

## 2020-05-22 ENCOUNTER — Encounter: Payer: Self-pay | Admitting: General Practice

## 2020-05-24 DIAGNOSIS — R87619 Unspecified abnormal cytological findings in specimens from cervix uteri: Secondary | ICD-10-CM | POA: Diagnosis not present

## 2020-06-12 ENCOUNTER — Encounter: Payer: Self-pay | Admitting: Internal Medicine

## 2020-06-12 DIAGNOSIS — J309 Allergic rhinitis, unspecified: Secondary | ICD-10-CM

## 2020-06-12 DIAGNOSIS — R42 Dizziness and giddiness: Secondary | ICD-10-CM

## 2020-06-12 DIAGNOSIS — R202 Paresthesia of skin: Secondary | ICD-10-CM

## 2020-06-13 DIAGNOSIS — Z1152 Encounter for screening for COVID-19: Secondary | ICD-10-CM | POA: Diagnosis not present

## 2020-06-19 DIAGNOSIS — Z20822 Contact with and (suspected) exposure to covid-19: Secondary | ICD-10-CM | POA: Diagnosis not present

## 2020-06-27 ENCOUNTER — Telehealth: Payer: Self-pay | Admitting: Internal Medicine

## 2020-06-27 DIAGNOSIS — J309 Allergic rhinitis, unspecified: Secondary | ICD-10-CM

## 2020-06-27 MED ORDER — FLUTICASONE PROPIONATE 50 MCG/ACT NA SUSP: 2.0000 | Freq: Every day | NASAL | 2 refills | Status: DC

## 2020-06-27 NOTE — Telephone Encounter (Signed)
Pt is calling in stating that she needs a refill on Rx fluticasone (FLONASE) 50 MCG Pharm: [facility].  Pt state she needs it now b/c it is an emergency due to her having an attack now.

## 2020-06-28 NOTE — Telephone Encounter (Signed)
Refill sent.

## 2020-07-12 NOTE — Addendum Note (Signed)
Addended by: Anderson Malta on: 07/12/2020 12:01 PM   Modules accepted: Orders

## 2020-07-16 ENCOUNTER — Telehealth: Payer: Self-pay | Admitting: Internal Medicine

## 2020-07-16 NOTE — Telephone Encounter (Signed)
Pt is calling in stating that she needs a new referral to the neurologist with the correct diagnoses on it stating head trauma, dizziness and headaches.  Pt would like to have a call back.

## 2020-07-16 NOTE — Telephone Encounter (Signed)
New referral placed, and message sent via mychart.

## 2020-07-16 NOTE — Addendum Note (Signed)
Addended by: Westley Hummer B on: 07/16/2020 04:18 PM   Modules accepted: Orders

## 2020-07-17 ENCOUNTER — Encounter: Payer: Self-pay | Admitting: Neurology

## 2020-07-17 NOTE — Telephone Encounter (Signed)
Pt called stating that she would like to have a call back to let her know about the referral.  Pt was made aware that the information had been sent to her mychart and she stated that she had not rec'd it and want someone to call her today.

## 2020-07-17 NOTE — Telephone Encounter (Signed)
See mychart note.  Patient is aware.

## 2020-08-15 NOTE — Progress Notes (Signed)
New Patient Note  RE: Kimberly Conway MRN: [id number] DOB: 07-12-1955 Date of Office Visit: 08/16/2020  Referring provider: Isaac Bliss, [place] [facility]* Primary care provider: Isaac Bliss, Rayford Halsted, MD  Chief Complaint: Rash (Unsure of cause on arms, upper chest, and between breast - couple of months ago started with redness on inner arms used hydrocortisone, calamine lotion, but only bio oil helped ) and Allergic Rhinitis  (Started when she moved to [place] suffers from vertigo so could not stop antihistamines )  History of Present Illness: I had the pleasure of seeing Kimberly Conway for initial evaluation at the [facility] on 08/17/2020. She is a 65 y.o. female, who is referred here by Isaac Bliss, Rayford Halsted, MD for the evaluation of rash.  Rash started about 1 year ago. Mainly occurs on her neck, shoulder, antecubital fossa area. Describes them as red, burning, flat. Individual rashes lasts about 1 week. Associated symptoms include: none. Suspected triggers are unknown but heat and sweating makes it worse. Denies any fevers, chills, changes in medications, foods, personal care products or recent infections. She has tried the following therapies: bio oil, triamcinolone, Calmoseptine with some benefit. Currently using body wash for eczema, still using perfume, Eucerin, laundry detergent with free scents, fabric softener.  Patient uses her perfume on a daily basis as it is "part of her".   Previous work up includes: none. Previous history of rash/hives: yes in the past on the back.  Assessment and Plan: Kimberly Conway is a 65 y.o. female with: Allergic contact dermatitis 1 year history of burning, erythematous rash on the neck, shoulder and antecubital fossa area lasting 1 week at a time. Not sure of triggers. Tried various OTC ointments/creams with some benefit. History of eczema. There was a strong fragrance scent on the patient during visit - she wears perfume on a daily  basis.  Discussed with patient that given distribution of the rash and clinical history - this is concerning for contact dermatitis especially to something she is using on her body such as fragrances.   See below for proper skin care.   Use fragrance free and dye free products.   No laundry softener or dryer sheets  May use triamcinolone 0.1% ointment twice a day as needed for eczema flares. Do not use on the face, neck, armpits or groin area. Do not use more than 3 weeks in a row.   May use desonide 0.05% ointment twice a day as needed for mild eczema flares - okay to use on the face, neck, groin area. Do not use more than 1 week at a time.  Return for patch testing  Other allergic rhinitis Rhino conjunctivitis symptoms and takes Xyzal and Flonase daily which helps with her vertigo as well. Symptoms worse in the spring.  Unable to skin test today due to recent antihistamine intake. Get bloodwork instead as below.   May use over the counter antihistamines such as Zyrtec (cetirizine), Claritin (loratadine), Allegra (fexofenadine), or Xyzal (levocetirizine) daily as needed.  May use Flonase (fluticasone) nasal spray 1 spray per nostril twice a day as needed for nasal congestion.   Nasal saline spray (i.e., Simply Saline) or nasal saline lavage (i.e., NeilMed) is recommended as needed and prior to medicated nasal sprays.  Return for Patch testing.  Meds ordered this encounter  Medications  . desonide (DESOWEN) 0.05 % ointment    Sig: Apply 1 application topically 2 (two) times daily as needed (rash). okay to use on the  face, neck, groin area. Do not use more than 1 week at a time.    Dispense:  60 g    Refill:  1  . triamcinolone ointment (KENALOG) 0.1 %    Sig: Apply 1 application topically 2 (two) times daily as needed (rash). Do not use on the face, neck, armpits or groin area. Do not use more than 3 weeks in a row.    Dispense:  60 g    Refill:  1    Lab Orders      Allergens w/Total IgE Area 2  Other allergy screening: Asthma: no Rhino conjunctivitis: yes  Rhinorrhea, sneezing, itchy nose and takes Xyzal daily at night with good benefit.  Takes Flonase daily as well which also helps with her vertigo.  Uses saline spray prn.  Symptoms worse in the spring.   Food allergy: no Medication allergy: no Hymenoptera allergy: no History of recurrent infections suggestive of immunodeficency: no  Diagnostics: Skin Testing: Deferred due to recent antihistamines use.  Past Medical History: Patient Active Problem List   Diagnosis Date Noted  . Allergic contact dermatitis 08/17/2020  . Other atopic dermatitis 08/17/2020  . Other allergic rhinitis 08/17/2020  . Depression   . HTN (hypertension)   . Multiple thyroid nodules   . Anxiety and depression 01/02/2019  . Gastroesophageal reflux disease without esophagitis 01/02/2019  . Seasonal allergies 01/02/2019   Past Medical History:  Diagnosis Date  . Depression   . GAD (generalized anxiety disorder)   . GERD (gastroesophageal reflux disease)   . HTN (hypertension)   . Multiple thyroid nodules   . Other atopic dermatitis 08/17/2020  . Seasonal allergies   . Urticaria    Past Surgical History: Past Surgical History:  Procedure Laterality Date  . APPENDECTOMY     Medication List:  Current Outpatient Medications  Medication Sig Dispense Refill  . b complex vitamins tablet Take 1 tablet by mouth daily.    . Calcium 500-125 MG-UNIT TABS Take by mouth.    . Calcium Citrate (CITRACAL PO) Take 1,200 mg by mouth 3 (three) times daily.     [name] Kitchen desonide (DESOWEN) 0.05 % ointment Apply 1 application topically 2 (two) times daily as needed (rash). okay to use on the face, neck, groin area. Do not use more than 1 week at a time. 60 g 1  . desvenlafaxine (PRISTIQ) 50 MG 24 hr tablet Take 1 tablet (50 mg total) by mouth daily. 90 tablet 3  . fluticasone (FLONASE) 50 MCG/ACT nasal spray Place 2 sprays into both  nostrils daily. 48 g 2  . glucosamine-chondroitin 500-400 MG tablet Take 1 tablet by mouth daily.    [name] Kitchen levocetirizine (XYZAL) 5 MG tablet Take 1 tablet (5 mg total) by mouth every evening. 90 tablet 1  . LORazepam (ATIVAN) 0.5 MG tablet Take 1 tablet (0.5 mg total) by mouth daily as needed. 30 tablet 2  . metoprolol succinate (TOPROL-XL) 25 MG 24 hr tablet Take 25 mg by mouth. Take half tablet daily    . Multiple Vitamin (MULTIVITAMIN) tablet Take 1 tablet by mouth daily.    [name] Kitchen omega-3 fish oil (MAXEPA) 1000 MG CAPS capsule Take 1 capsule by mouth daily.     [name] Kitchen omeprazole (PRILOSEC) 20 MG capsule Take 1 capsule (20 mg total) by mouth 2 (two) times daily before a meal. 180 capsule 3  . OVER THE COUNTER MEDICATION Glucosamine 1500 mg  Chon 1200 mg    . Probiotic Product (PROBIOTIC-10 PO) Take by  mouth.    . triamcinolone ointment (KENALOG) 0.1 % Apply 1 application topically 2 (two) times daily as needed (rash). Do not use on the face, neck, armpits or groin area. Do not use more than 3 weeks in a row. 60 g 1   No current facility-administered medications for this visit.   Allergies: No Known Allergies Social History: Social History   Socioeconomic History  . Marital status: Single    Spouse name: Not on file  . Number of children: Not on file  . Years of education: Not on file  . Highest education level: Not on file  Occupational History  . Not on file  Tobacco Use  . Smoking status: Former Smoker    Types: Cigarettes    Quit date: 06/21/2014    Years since quitting: 6.1  . Smokeless tobacco: Never Used  Vaping Use  . Vaping Use: Never used  Substance and Sexual Activity  . Alcohol use: Not Currently  . Drug use: Never  . Sexual activity: Not on file  Other Topics Concern  . Not on file  Social History Narrative  . Not on file   Social Determinants of Health   Financial Resource Strain: Not on file  Food Insecurity: Not on file  Transportation Needs: Not on file   Physical Activity: Not on file  Stress: Not on file  Social Connections: Not on file   Lives in an apartment. Smoking: quit 7 years ago Occupation: works in an Sports coach History: Environmental education officer in the house: no Charity fundraiser in the family room: yes Carpet in the bedroom: yes Heating: electric Cooling: central Pet: yes 2 cats  Family History: Family History  Problem Relation Age of Onset  . Diabetes Mother   . Breast cancer Mother   . CAD Father   . Hypertension Sister   . Breast cancer Sister   . Diabetes Brother   . Colon cancer Neg Hx   . Esophageal cancer Neg Hx   . Pancreatic cancer Neg Hx   . Stomach cancer Neg Hx    Problem                               Relation Asthma                                   No  Eczema                                No Food allergy                          No  Allergic rhino conjunctivitis     Mother    Review of Systems  Constitutional: Negative for appetite change, chills, fever and unexpected weight change.  HENT: Positive for rhinorrhea and sneezing. Negative for congestion.   Eyes: Negative for itching.  Respiratory: Negative for cough, chest tightness, shortness of breath and wheezing.   Cardiovascular: Negative for chest pain.  Gastrointestinal: Negative for abdominal pain.  Genitourinary: Negative for difficulty urinating.  Skin: Positive for rash.  Neurological: Negative for headaches.   Objective: BP 128/78   Pulse 74   Temp 98.5 F (36.9 C)   Resp 18   Wt 164 lb 6.4 oz (74.6 kg)  SpO2 97%   BMI 31.06 kg/m  Body mass index is 31.06 kg/m. Physical Exam Vitals and nursing note reviewed.  Constitutional:      Appearance: Normal appearance. She is well-developed.  HENT:     Head: Normocephalic and atraumatic.     Right Ear: External ear normal.     Left Ear: External ear normal.     Nose: Nose normal.     Mouth/Throat:     Mouth: Mucous membranes are moist.     Pharynx: Oropharynx is clear.   Eyes:     Conjunctiva/sclera: Conjunctivae normal.  Cardiovascular:     Rate and Rhythm: Normal rate and regular rhythm.     Heart sounds: Normal heart sounds. No murmur heard. No friction rub. No gallop.   Pulmonary:     Effort: Pulmonary effort is normal.     Breath sounds: Normal breath sounds. No wheezing, rhonchi or rales.  Abdominal:     Palpations: Abdomen is soft.  Musculoskeletal:     Cervical back: Neck supple.  Skin:    General: Skin is warm.     Findings: No rash.  Neurological:     Mental Status: She is alert and oriented to person, place, and time.  Psychiatric:        Behavior: Behavior normal.    The plan was reviewed with the patient/family, and all questions/concerned were addressed.  It was my pleasure to see Kimberly Conway today and participate in her care. Please feel free to contact me with any questions or concerns.  Sincerely,  Rexene Alberts, DO Allergy & Immunology  [facility] office: [place] office: [phone]

## 2020-08-16 ENCOUNTER — Other Ambulatory Visit: Payer: Self-pay

## 2020-08-16 ENCOUNTER — Ambulatory Visit: Payer: Medicare HMO | Admitting: Allergy

## 2020-08-16 ENCOUNTER — Encounter: Payer: Self-pay | Admitting: Allergy

## 2020-08-16 VITALS — BP 128/78 | HR 74 | Temp 98.5°F | Resp 18 | Wt 164.4 lb

## 2020-08-16 DIAGNOSIS — L2089 Other atopic dermatitis: Secondary | ICD-10-CM

## 2020-08-16 DIAGNOSIS — L239 Allergic contact dermatitis, unspecified cause: Secondary | ICD-10-CM | POA: Diagnosis not present

## 2020-08-16 DIAGNOSIS — J3089 Other allergic rhinitis: Secondary | ICD-10-CM

## 2020-08-16 MED ORDER — TRIAMCINOLONE ACETONIDE 0.1 % EX OINT: 1.0000 "application " | TOPICAL_OINTMENT | Freq: Two times a day (BID) | CUTANEOUS | 1 refills | Status: DC | PRN

## 2020-08-16 MED ORDER — DESONIDE 0.05 % EX OINT: 1.0000 "application " | TOPICAL_OINTMENT | Freq: Two times a day (BID) | CUTANEOUS | 1 refills | Status: DC | PRN

## 2020-08-16 NOTE — Patient Instructions (Addendum)
Skin:   See below for proper skin care.   Use fragrance free and dye free products.   No laundry softener or dryer sheets  May use triamcinolone 0.1% ointment twice a day as needed for eczema flares. Do not use on the face, neck, armpits or groin area. Do not use more than 3 weeks in a row.   May use desonide 0.05% ointment twice a day as needed for mild eczema flares - okay to use on the face, neck, groin area. Do not use more than 1 week at a time.  Return for patch testing  Patches are best placed on Monday with return to office on Wednesday and Friday of same week for readings.  Patches once placed should not get wet.  You do not have to stop any medications for patch testing but should not be on oral prednisone. You can schedule a patch testing visit when convenient for your schedule.    Rhinitis:   May use over the counter antihistamines such as Zyrtec (cetirizine), Claritin (loratadine), Allegra (fexofenadine), or Xyzal (levocetirizine) daily as needed.  May use Flonase (fluticasone) nasal spray 1 spray per nostril twice a day as needed for nasal congestion.   Nasal saline spray (i.e., Simply Saline) or nasal saline lavage (i.e., NeilMed) is recommended as needed and prior to medicated nasal sprays. Get bloodwork:  We are ordering labs, so please allow 1-2 weeks for the results to come back. With the newly implemented Cures Act, the labs might be visible to you at the same time that they become visible to me. However, I will not address the results until all of the results are back, so please be patient.   Follow up in for patch testing   True Test looks for the following sensitivities:       Skin care recommendations  Bath time: . Always use lukewarm water. AVOID very hot or cold water. [name] Kitchen Keep bathing time to 5-10 minutes. . Do NOT use bubble bath. . Use a mild soap and use just enough to wash the dirty areas. . Do NOT scrub skin vigorously.  . After bathing, pat dry  your skin with a towel. Do NOT rub or scrub the skin.  Moisturizers and prescriptions:  . ALWAYS apply moisturizers immediately after bathing (within 3 minutes). This helps to lock-in moisture. . Use the moisturizer several times a day over the whole body. Kermit Balo summer moisturizers include: Aveeno, CeraVe, Cetaphil. Kermit Balo winter moisturizers include: Aquaphor, Vaseline, Cerave, Cetaphil, Eucerin, Vanicream. . When using moisturizers along with medications, the moisturizer should be applied about one hour after applying the medication to prevent diluting effect of the medication or moisturize around where you applied the medications. When not using medications, the moisturizer can be continued twice daily as maintenance.  Laundry and clothing: . Avoid laundry products with added color or perfumes. . Use unscented hypo-allergenic laundry products such as Tide free, Cheer free & gentle, and All free and clear.  . If the skin still seems dry or sensitive, you can try double-rinsing the clothes. . Avoid tight or scratchy clothing such as wool. . Do not use fabric softeners or dyer sheets. 7

## 2020-08-17 ENCOUNTER — Encounter: Payer: Self-pay | Admitting: Allergy

## 2020-08-17 DIAGNOSIS — L239 Allergic contact dermatitis, unspecified cause: Secondary | ICD-10-CM | POA: Insufficient documentation

## 2020-08-17 DIAGNOSIS — L2089 Other atopic dermatitis: Secondary | ICD-10-CM

## 2020-08-17 DIAGNOSIS — J3089 Other allergic rhinitis: Secondary | ICD-10-CM | POA: Insufficient documentation

## 2020-08-17 HISTORY — DX: Other atopic dermatitis: L20.89

## 2020-08-17 NOTE — Assessment & Plan Note (Signed)
Rhino conjunctivitis symptoms and takes Xyzal and Flonase daily which helps with her vertigo as well. Symptoms worse in the spring.  Unable to skin test today due to recent antihistamine intake. Get bloodwork instead as below.   May use over the counter antihistamines such as Zyrtec (cetirizine), Claritin (loratadine), Allegra (fexofenadine), or Xyzal (levocetirizine) daily as needed.  May use Flonase (fluticasone) nasal spray 1 spray per nostril twice a day as needed for nasal congestion.   Nasal saline spray (i.e., Simply Saline) or nasal saline lavage (i.e., NeilMed) is recommended as needed and prior to medicated nasal sprays.

## 2020-08-17 NOTE — Assessment & Plan Note (Signed)
1 year history of burning, erythematous rash on the neck, shoulder and antecubital fossa area lasting 1 week at a time. Not sure of triggers. Tried various OTC ointments/creams with some benefit. History of eczema. There was a strong fragrance scent on the patient during visit - she wears perfume on a daily basis.  Discussed with patient that given distribution of the rash and clinical history - this is concerning for contact dermatitis especially to something she is using on her body such as fragrances.   See below for proper skin care.   Use fragrance free and dye free products.   No laundry softener or dryer sheets  May use triamcinolone 0.1% ointment twice a day as needed for eczema flares. Do not use on the face, neck, armpits or groin area. Do not use more than 3 weeks in a row.   May use desonide 0.05% ointment twice a day as needed for mild eczema flares - okay to use on the face, neck, groin area. Do not use more than 1 week at a time.  Return for patch testing

## 2020-08-20 LAB — ALLERGENS W/TOTAL IGE AREA 2
Alternaria Alternata IgE: <0.1 kU/L
Aspergillus Fumigatus IgE: <0.1 kU/L
Bermuda Grass IgE: 0.12 kU/L — AB
Cat Dander IgE: <0.1 kU/L
Cedar, Mountain IgE: <0.1 kU/L
Cladosporium Herbarum IgE: <0.1 kU/L
Cockroach, German IgE: <0.1 kU/L
Common Silver Birch IgE: <0.1 kU/L
Cottonwood IgE: <0.1 kU/L
D Farinae IgE: <0.1 kU/L
D Pteronyssinus IgE: <0.1 kU/L
Dog Dander IgE: <0.1 kU/L
Elm, American IgE: <0.1 kU/L
IgE (Immunoglobulin E), Serum: 9 IU/mL (ref 6–495)
Johnson Grass IgE: <0.1 kU/L
Maple/Box Elder IgE: <0.1 kU/L
Mouse Urine IgE: <0.1 kU/L
Oak, White IgE: <0.1 kU/L
Pecan, Hickory IgE: <0.1 kU/L
Penicillium Chrysogen IgE: <0.1 kU/L
Pigweed, Rough IgE: <0.1 kU/L
Ragweed, Short IgE: <0.1 kU/L
Sheep Sorrel IgE Qn: <0.1 kU/L
Timothy Grass IgE: <0.1 kU/L
White Mulberry IgE: <0.1 kU/L

## 2020-08-28 ENCOUNTER — Telehealth: Payer: Self-pay | Admitting: *Deleted

## 2020-08-28 MED ORDER — HYDROCORTISONE 2.5 % EX CREA: TOPICAL_CREAM | Freq: Two times a day (BID) | CUTANEOUS | 2 refills | Status: DC | PRN

## 2020-08-28 NOTE — Telephone Encounter (Signed)
PA has been submitted through CoverMyMeds for Desonide Ointment and is currently pending approval/denial.

## 2020-08-28 NOTE — Telephone Encounter (Signed)
PA was denied for Desonide stating that preferred alternatives are Betamethasone Valerate Ointment, Fluticasone Propionate Ointment, Hydrocortisone Ointment, and Mometasone Ointment, please advise change in ointment. Thank You.

## 2020-08-28 NOTE — Telephone Encounter (Signed)
Sent in hdyrocortisone

## 2020-09-02 ENCOUNTER — Ambulatory Visit: Payer: Medicare HMO | Admitting: Gastroenterology

## 2020-09-17 NOTE — Progress Notes (Addendum)
NEUROLOGY CONSULTATION NOTE  Kimberly Conway MRN: [id number] DOB: Oct 15, 1955  Referring provider: Lelon Frohlich, MD Primary care provider: Lelon Frohlich, MD  Reason for consult:  Headache, dizziness  Assessment/Plan:   1.  Dizziness - described primarily as a lightheadedness.  - given that it often occurs when neck turn, will evaluate for possible vertebral artery compression.  She also describes hearing crackling in her ears with neck movement - possible spondylosis - also consider cervicogenic etiology as well.  She sometimes reports pulsatile tinnitus which requires further   1.  Check MRA of head and neck 2.  Further recommendations pending results.   Subjective:  Kimberly Conway is a 65 year old right-handed female with HTN, CAD and depression who presents for headache and dizziness.  History supplemented by ENT and referring provider's notes.  She has history of true positional vertigo, but presents for a different dizziness.  5 years ago, she hit her right forehead on a pole, when she tripped while carrying groceries.  Once in awhile she may get a paroxysmal sharp pain in the forehead.  When she drives and comes to a stop, she feels an "imbalance" in her head.  If she turns her head, particularly to the left, she feels it.  It feels like she is going to pass out.  Sometimes it is a mild lightheadedness.  Not a spinning.  No double vision.  She tries to breath or moves the car, which aborts the dizziness.  It happens almost all of the time.  She cannot sleep on her left side, which may trigger the lightheaded dizziness.  No neck pain.  Sometimes she hears a crackling noise in both ears when she turns her head, usually occurs in cold weather.  Sometimes she may hear whooshing in her head.  Overall, symptoms have not progressed.  In October 2021, she saw ENT for hearing loss and nasal congestion and was diagnosed with chronic rhinitis.  At that time, she denied vertigo.     PAST MEDICAL HISTORY: Past Medical History:  Diagnosis Date  . Depression   . GAD (generalized anxiety disorder)   . GERD (gastroesophageal reflux disease)   . HTN (hypertension)   . Multiple thyroid nodules   . Other atopic dermatitis 08/17/2020  . Seasonal allergies   . Urticaria     PAST SURGICAL HISTORY: Past Surgical History:  Procedure Laterality Date  . APPENDECTOMY      MEDICATIONS: Current Outpatient Medications on File Prior to Visit  Medication Sig Dispense Refill  . b complex vitamins tablet Take 1 tablet by mouth daily.    . Calcium 500-125 MG-UNIT TABS Take by mouth.    . Calcium Citrate (CITRACAL PO) Take 1,200 mg by mouth 3 (three) times daily.     [name] Kitchen desvenlafaxine (PRISTIQ) 50 MG 24 hr tablet Take 1 tablet (50 mg total) by mouth daily. 90 tablet 3  . fluticasone (FLONASE) 50 MCG/ACT nasal spray Place 2 sprays into both nostrils daily. 48 g 2  . glucosamine-chondroitin 500-400 MG tablet Take 1 tablet by mouth daily.    . hydrocortisone 2.5 % cream Apply topically 2 (two) times daily as needed (rash). okay to use on the face, neck, groin area. Do not use more than 1 week at a time. 30 g 2  . levocetirizine (XYZAL) 5 MG tablet Take 1 tablet (5 mg total) by mouth every evening. 90 tablet 1  . LORazepam (ATIVAN) 0.5 MG tablet Take 1 tablet (0.5  mg total) by mouth daily as needed. 30 tablet 2  . metoprolol succinate (TOPROL-XL) 25 MG 24 hr tablet Take 25 mg by mouth. Take half tablet daily    . Multiple Vitamin (MULTIVITAMIN) tablet Take 1 tablet by mouth daily.    [name] Kitchen omega-3 fish oil (MAXEPA) 1000 MG CAPS capsule Take 1 capsule by mouth daily.     [name] Kitchen omeprazole (PRILOSEC) 20 MG capsule Take 1 capsule (20 mg total) by mouth 2 (two) times daily before a meal. 180 capsule 3  . OVER THE COUNTER MEDICATION Glucosamine 1500 mg  Chon 1200 mg    . Probiotic Product (PROBIOTIC-10 PO) Take by mouth.    . triamcinolone ointment (KENALOG) 0.1 % Apply 1 application topically 2  (two) times daily as needed (rash). Do not use on the face, neck, armpits or groin area. Do not use more than 3 weeks in a row. 60 g 1   No current facility-administered medications on file prior to visit.    ALLERGIES: No Known Allergies  FAMILY HISTORY: Family History  Problem Relation Age of Onset  . Diabetes Mother   . Breast cancer Mother   . CAD Father   . Hypertension Sister   . Breast cancer Sister   . Diabetes Brother   . Colon cancer Neg Hx   . Esophageal cancer Neg Hx   . Pancreatic cancer Neg Hx   . Stomach cancer Neg Hx     Objective:  Blood pressure 119/78, pulse 71, height 5\' 2"  (1.575 m), weight 161 lb 3.2 oz (73.1 kg), SpO2 98 %. General: No acute distress.  Patient appears well-groomed.   Head:  Normocephalic/atraumatic Eyes:  fundi examined but not visualized Neck: supple, no paraspinal tenderness, full range of motion Back: No paraspinal tenderness Heart: regular rate and rhythm Lungs: Clear to auscultation bilaterally. Vascular: No carotid bruits. Neurological Exam: Mental status: alert and oriented to person, place, and time, recent and remote memory intact, fund of knowledge intact, attention and concentration intact, speech fluent and not dysarthric, language intact. Cranial nerves: CN I: not tested CN II: pupils equal, round and reactive to light, visual fields intact CN III, IV, VI:  full range of motion, no nystagmus, no ptosis CN V: facial sensation intact. CN VII: upper and lower face symmetric CN VIII: hearing intact CN IX, X: gag intact, uvula midline CN XI: sternocleidomastoid and trapezius muscles intact CN XII: tongue midline Bulk & Tone: normal, no fasciculations. Motor:  muscle strength 5/5 throughout Sensation:  Pinprick, temperature and vibratory sensation intact. Deep Tendon Reflexes:  2+ throughout,  toes downgoing.   Finger to nose testing:  Without dysmetria.   Heel to shin:  Without dysmetria.   Gait:  Normal station and  stride.  Romberg negative.    Thank you for allowing me to take part in the care of this patient.  Metta Clines, DO  CC: Lelon Frohlich, MD

## 2020-09-19 ENCOUNTER — Encounter: Payer: Self-pay | Admitting: Neurology

## 2020-09-19 ENCOUNTER — Ambulatory Visit: Payer: Medicare HMO | Admitting: Neurology

## 2020-09-19 ENCOUNTER — Other Ambulatory Visit: Payer: Self-pay

## 2020-09-19 VITALS — BP 119/78 | HR 71 | Ht 62.0 in | Wt 161.2 lb

## 2020-09-19 DIAGNOSIS — R42 Dizziness and giddiness: Secondary | ICD-10-CM

## 2020-09-19 DIAGNOSIS — H93A9 Pulsatile tinnitus, unspecified ear: Secondary | ICD-10-CM | POA: Diagnosis not present

## 2020-09-19 NOTE — Patient Instructions (Signed)
1.  We will check MRI of brain and MRA of head and neck 2.  Further recommendations pending results.

## 2020-09-29 ENCOUNTER — Other Ambulatory Visit: Payer: Self-pay | Admitting: Gastroenterology

## 2020-09-30 ENCOUNTER — Ambulatory Visit: Payer: Medicare HMO | Admitting: Allergy

## 2020-10-04 ENCOUNTER — Other Ambulatory Visit: Payer: Self-pay | Admitting: Internal Medicine

## 2020-10-08 ENCOUNTER — Other Ambulatory Visit: Payer: Self-pay | Admitting: *Deleted

## 2020-10-08 MED ORDER — OMEPRAZOLE 20 MG PO CPDR: 20.0000 mg | DELAYED_RELEASE_CAPSULE | Freq: Two times a day (BID) | ORAL | 3 refills | Status: DC

## 2020-10-10 ENCOUNTER — Ambulatory Visit: Payer: Medicare HMO | Admitting: Gastroenterology

## 2020-10-12 ENCOUNTER — Other Ambulatory Visit: Payer: Self-pay

## 2020-10-12 ENCOUNTER — Ambulatory Visit
Admission: RE | Admit: 2020-10-12 | Discharge: 2020-10-12 | Disposition: A | Discharge status: Home / Self Care | Payer: Medicare HMO | Source: Ambulatory Visit | Attending: Neurology | Admitting: Neurology

## 2020-10-12 DIAGNOSIS — H9319 Tinnitus, unspecified ear: Secondary | ICD-10-CM | POA: Diagnosis not present

## 2020-10-12 DIAGNOSIS — R42 Dizziness and giddiness: Secondary | ICD-10-CM

## 2020-10-12 DIAGNOSIS — H93A9 Pulsatile tinnitus, unspecified ear: Secondary | ICD-10-CM

## 2020-10-12 IMAGING — MR MR HEAD W/O CM
8 series · 48 of 48 positions shown · non-contrast
Comparison: None.

CLINICAL DATA: Nonspecific dizziness. Tinnitus. Head injury 5 years
ago.

EXAM:
MRI HEAD WITHOUT CONTRAST
MRA HEAD WITHOUT CONTRAST
TECHNIQUE: Multiplanar, multiecho pulse sequences of the brain and surrounding
structures were obtained without intravenous contrast. Angiographic
images of the head were obtained using MRA technique without
contrast.

[Series 5: T1 · sagittal · 4.0mm · 0.94mm/px · 2 of 31 slices shown (1 of 2)]
[im 1/31]
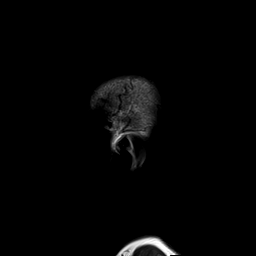
[im 31/31]
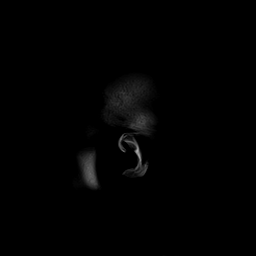

[Series 6: DWI · axial · 3.0mm · 0.94mm/px · z∈[-72,+82]mm · 13 of 176 slices shown]
[im 1/176]
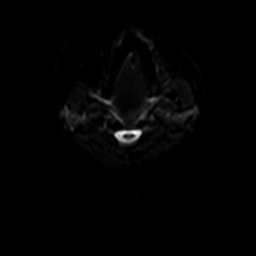
[im 15/176]
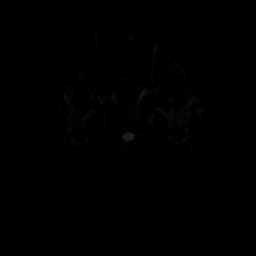
[im 30/176]
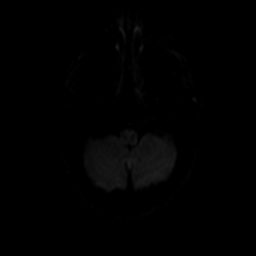
[im 44/176]
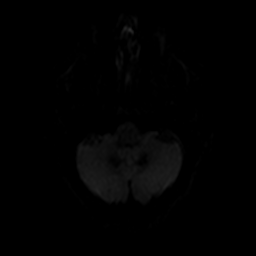
[im 59/176]
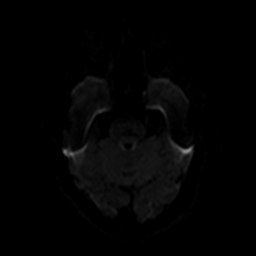
[im 73/176]
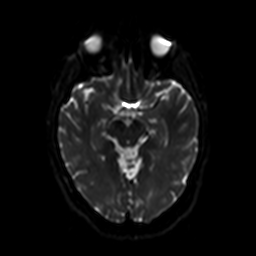
[im 88/176]
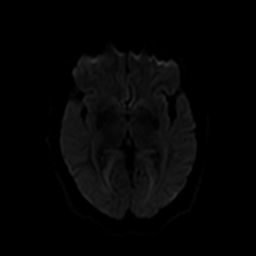
[im 103/176]
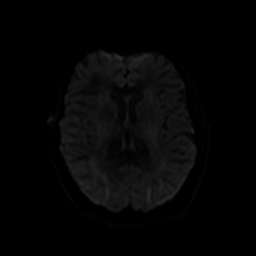
[im 117/176]
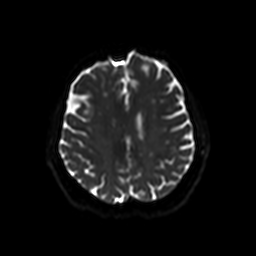
[im 132/176]
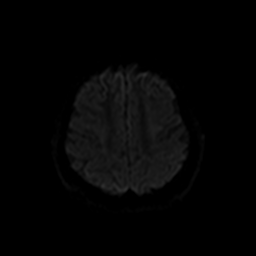
[im 146/176]
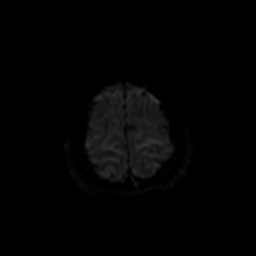
[im 161/176]
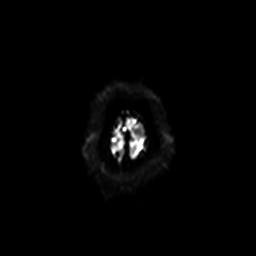
[im 176/176]
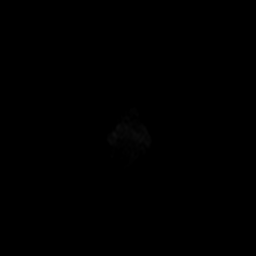

[Series 7: ax dwi_tracew · axial · 3.0mm · 0.94mm/px · z∈[-72,+82]mm · 6 of 87 slices shown]
[im 1/87]
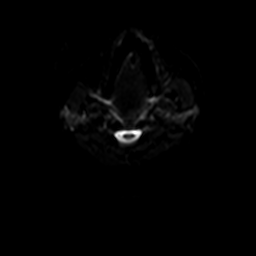
[im 18/87]
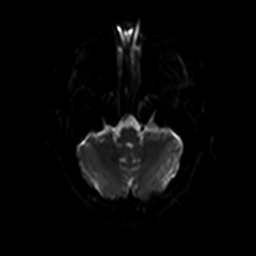
[im 35/87]
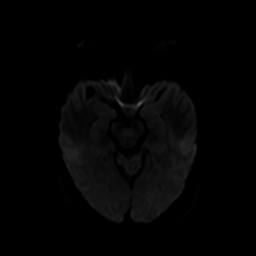
[im 52/87]
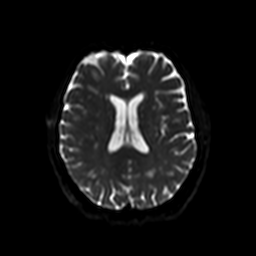
[im 69/87]
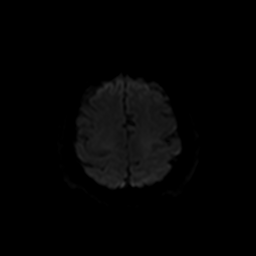
[im 87/87]
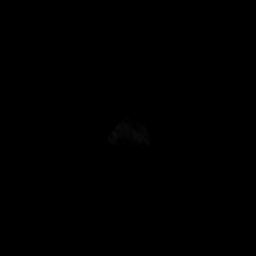

[Series 8: ax dwi_adc · axial · 3.0mm · 0.94mm/px · z∈[-72,+82]mm · 3 of 44 slices shown]
[im 1/44]
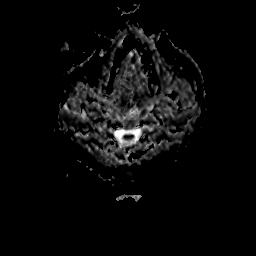
[im 22/44]
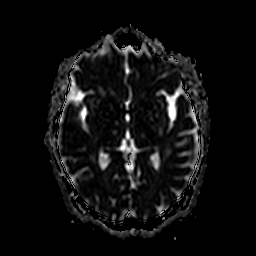
[im 44/44]
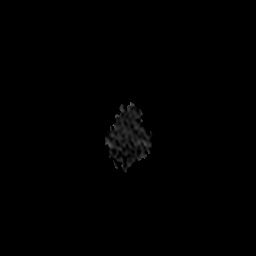

[Series 9: T2 · axial · 4.0mm · 0.36mm/px · z∈[-69,+81]mm · 2 of 30 slices shown]
[im 1/30]
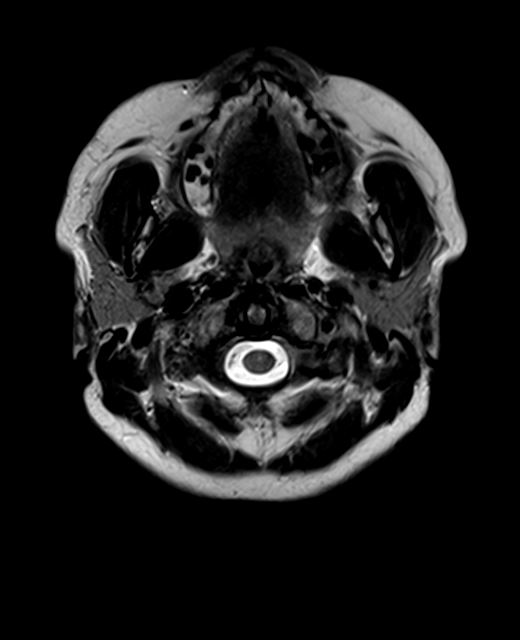
[im 30/30]
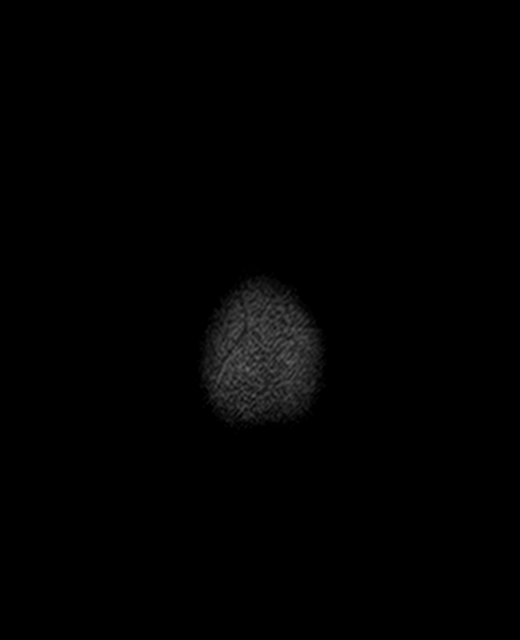

[Series 10: FLAIR · axial · 3.0mm · 0.72mm/px · z∈[-69,+80]mm · 2 of 26 slices shown]
[im 1/26]
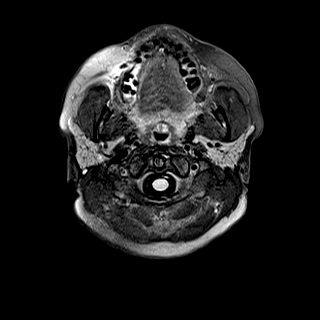
[im 26/26]
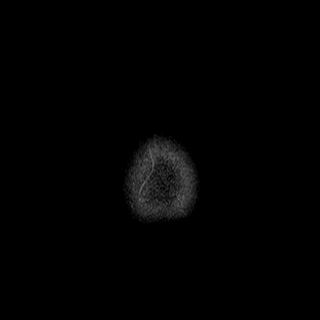

[Series 11: swi_images · axial · 1.5mm · 0.90mm/px · z∈[-70,+83]mm · 8 of 104 slices shown]
[im 1/104]
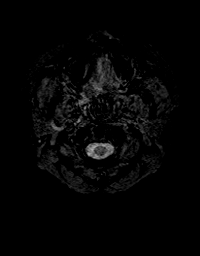
[im 15/104]
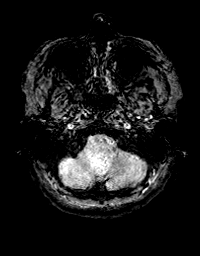
[im 30/104]
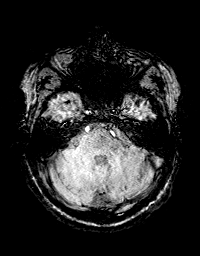
[im 45/104]
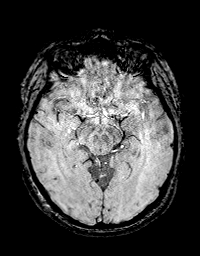
[im 59/104]
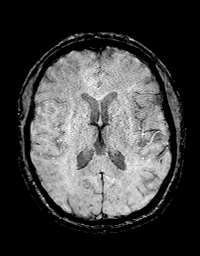
[im 74/104]
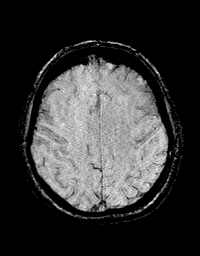
[im 89/104]
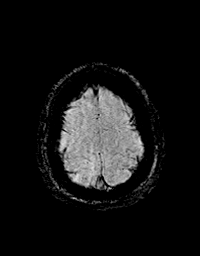
[im 104/104]
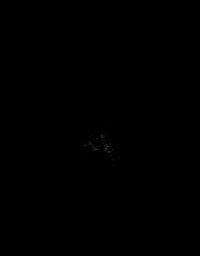

[Series 13: T1 · axial · 1.0mm · 0.94mm/px · z∈[-75,+84]mm · 12 of 160 slices shown (2 of 2)]
[im 1/160]
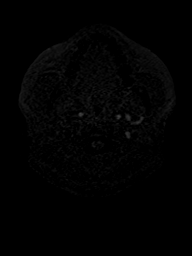
[im 15/160]
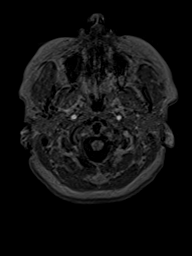
[im 29/160]
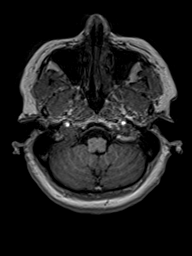
[im 44/160]
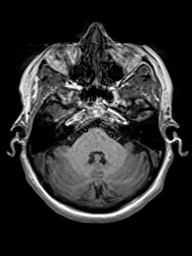
[im 58/160]
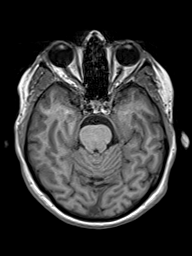
[im 73/160]
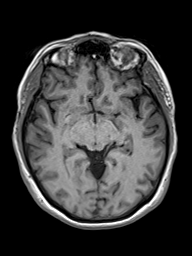
[im 87/160]
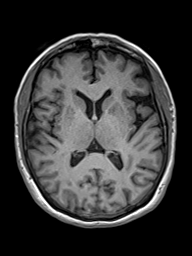
[im 102/160]
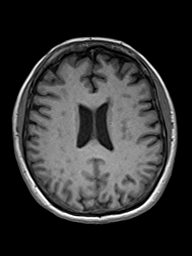
[im 116/160]
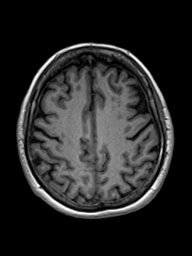
[im 131/160]
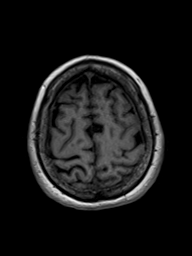
[im 145/160]
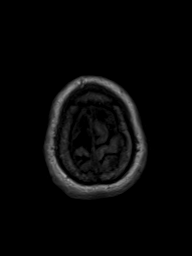
[im 160/160]
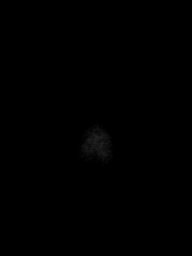

[48 of 48 positions shown; findings below may reference images not displayed]

FINDINGS: MRI HEAD FINDINGS

Brain: Diffusion imaging does not show any acute or subacute
infarction. Brainstem and cerebellum are normal. Cerebral
hemispheres show an old small vessel infarction in the right
thalamus, and old small vessel infarction in the left basal ganglia,
and moderate chronic appearing small vessel ischemic changes of the
deep and subcortical white matter. No cortical or large vessel
territory infarction. No mass lesion, hemorrhage, hydrocephalus or
extra-axial collection.

Vascular: Major vessels at the base of the brain show flow.

Skull and upper cervical spine: Negative

Sinuses/Orbits: Clear/normal

Other: None

MRA HEAD FINDINGS

Both internal carotid arteries are widely patent through the skull
base and siphon regions. The anterior and middle cerebral vessels
appear normal. No aneurysm, vascular malformation, proximal stenosis
or large vessel occlusion.

Small right vertebral artery does not show flow through the foramen
magnum. Large left vertebral artery widely patent to the basilar. No
retrograde flow seen in the right vertebral artery. Left PICA is
patent. Right anterior inferior cerebellar artery is patent.
Superior cerebellar and posterior cerebral arteries show flow.
IMPRESSION: No acute brain finding. Chronic small-vessel ischemic changes as
outlined above, advanced for age.

Intracranial anterior circulation vessels appear normal by MR
angiography.

No antegrade flow is seen in the small right vertebral artery beyond
the foramen magnum. Large left vertebral artery widely patent to the
basilar. Patent posterior circulation branch vessels as outlined
above. No sign of any infarction at parenchymal imaging on the basis
of absent right vertebral artery flow.

## 2020-10-12 IMAGING — MR MR MRA HEAD W/O CM
1 series · 10 of 48 positions shown · non-contrast
Comparison: None.

CLINICAL DATA: Nonspecific dizziness. Tinnitus. Head injury 5 years
ago.

EXAM:
MRI HEAD WITHOUT CONTRAST
MRA HEAD WITHOUT CONTRAST
TECHNIQUE: Multiplanar, multiecho pulse sequences of the brain and surrounding
structures were obtained without intravenous contrast. Angiographic
images of the head were obtained using MRA technique without
contrast.

[Series 10: tof_fl3d_[id number]_multi-slab · axial · 0.6mm · 0.26mm/px · z∈[-52,+29]mm · 10 of 175 slices shown]
[im 12/175]
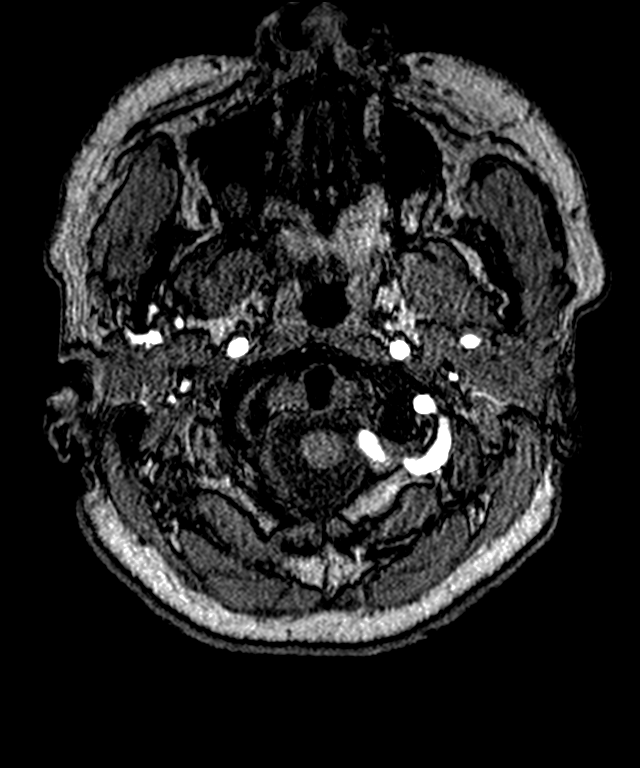
[im 30/175]
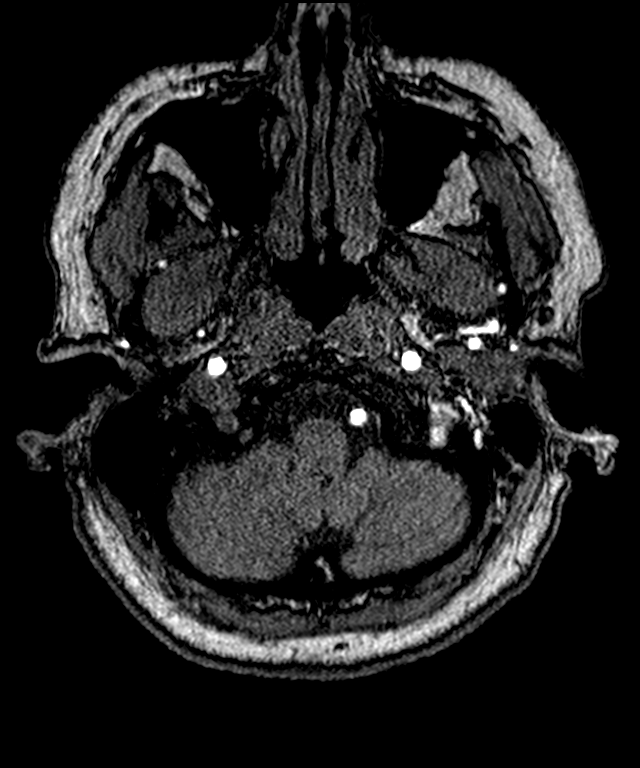
[im 34/175]
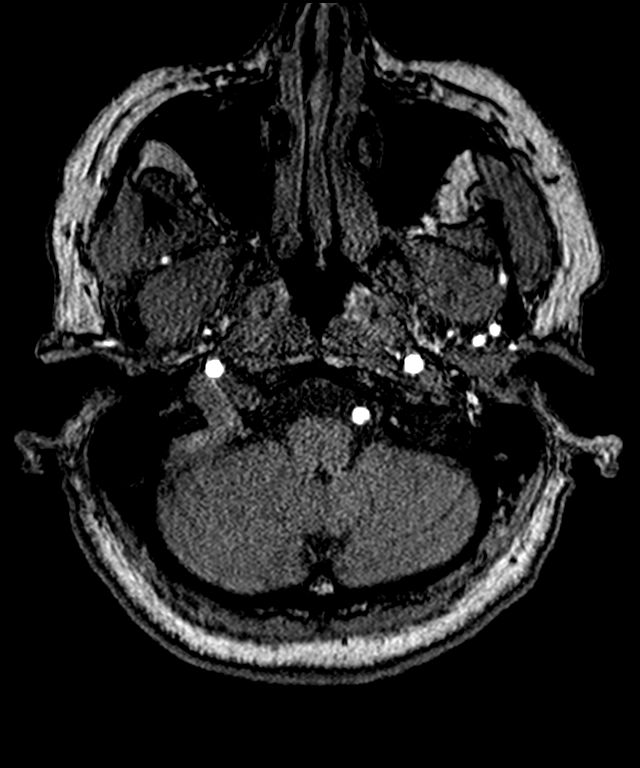
[im 56/175]
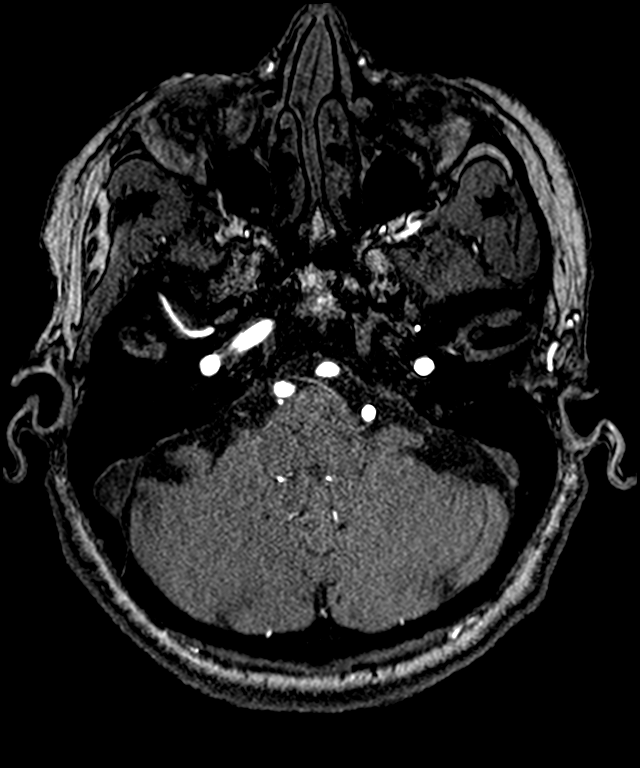
[im 78/175]
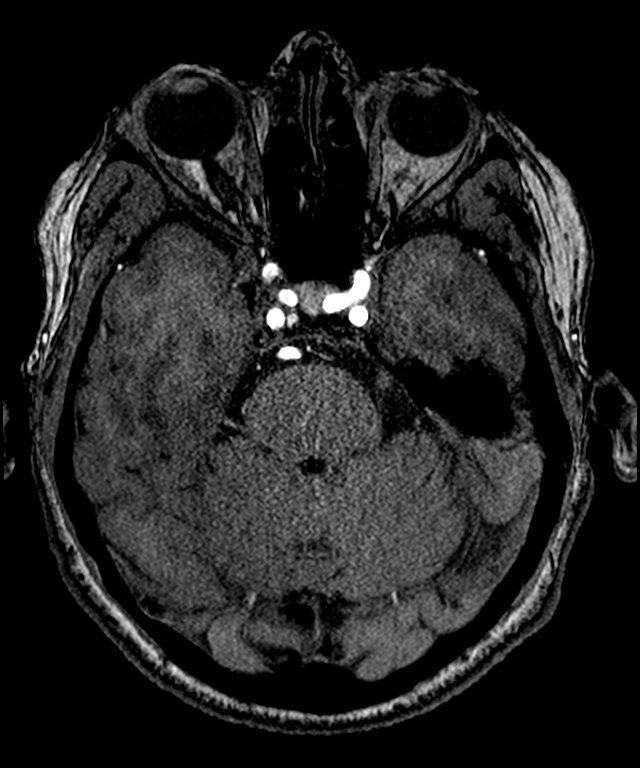
[im 89/175]
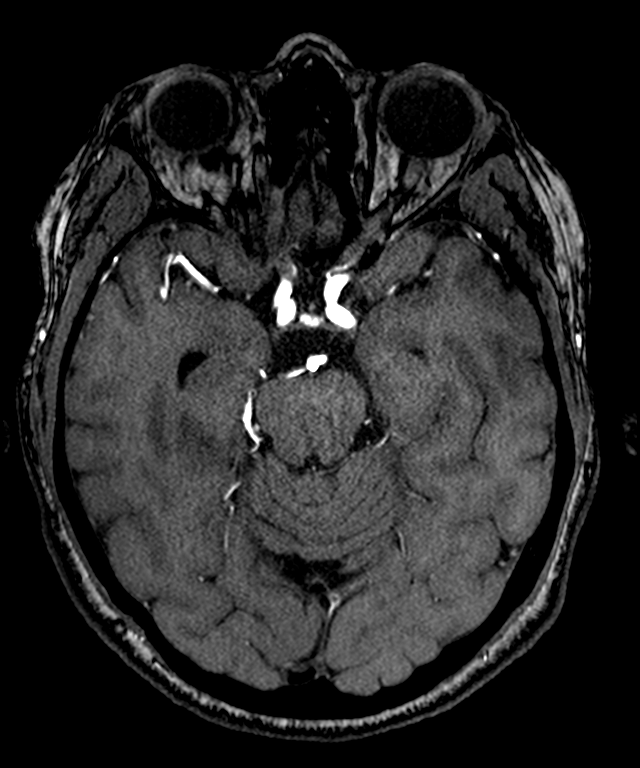
[im 100/175]
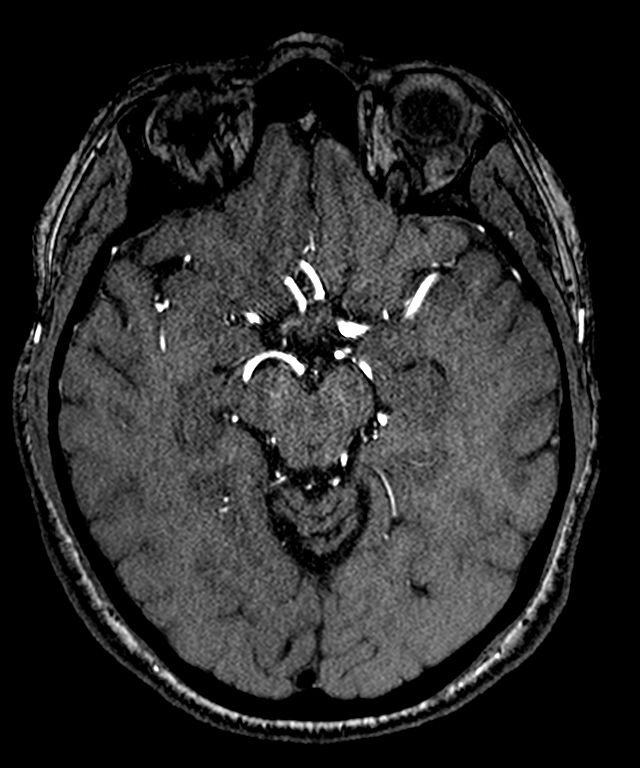
[im 123/175]
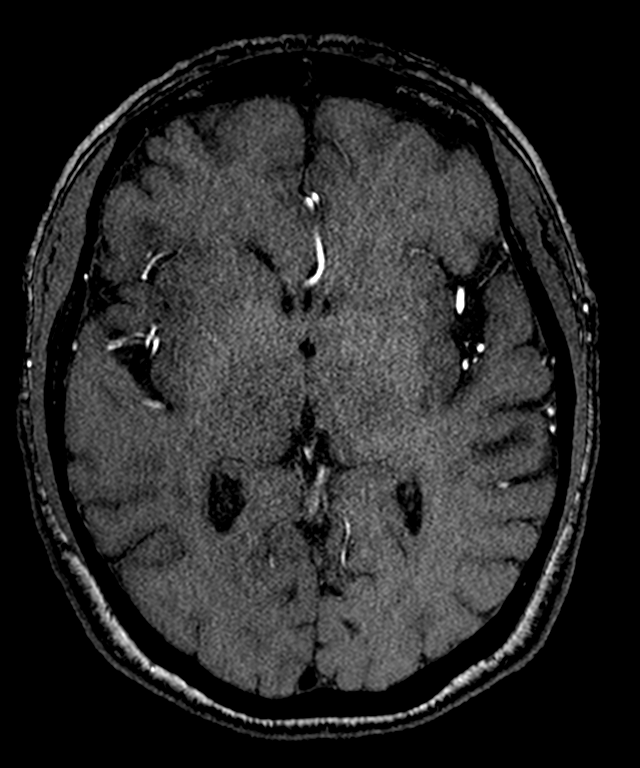
[im 145/175]
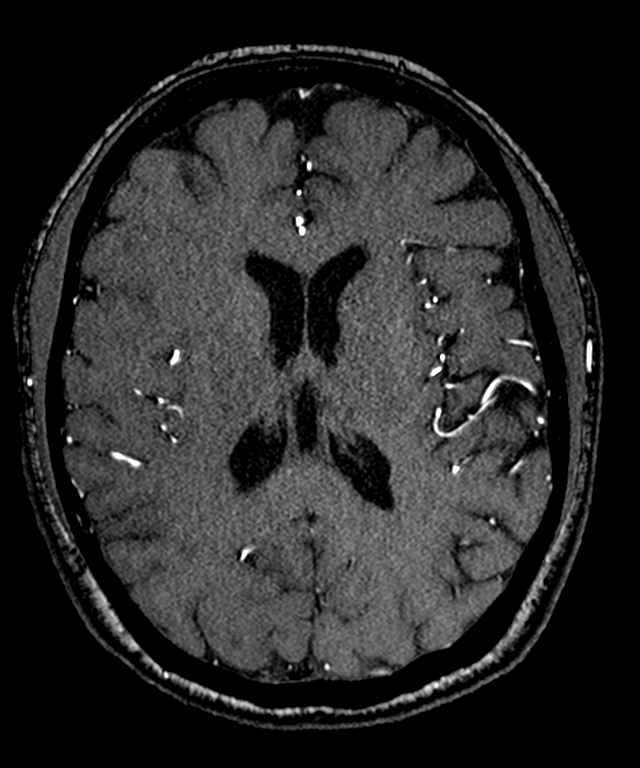
[im 149/175]
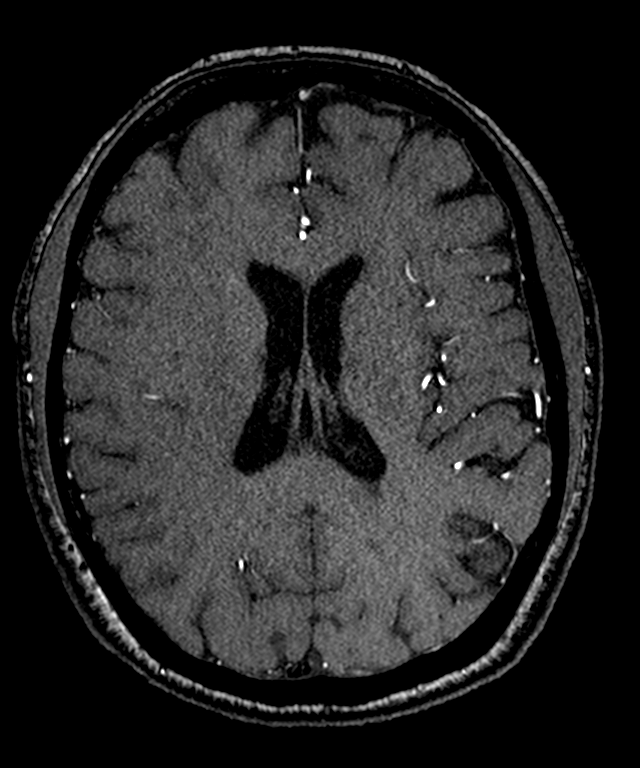

[10 of 48 positions shown; findings below may reference images not displayed]

FINDINGS: MRI HEAD FINDINGS

Brain: Diffusion imaging does not show any acute or subacute
infarction. Brainstem and cerebellum are normal. Cerebral
hemispheres show an old small vessel infarction in the right
thalamus, and old small vessel infarction in the left basal ganglia,
and moderate chronic appearing small vessel ischemic changes of the
deep and subcortical white matter. No cortical or large vessel
territory infarction. No mass lesion, hemorrhage, hydrocephalus or
extra-axial collection.

Vascular: Major vessels at the base of the brain show flow.

Skull and upper cervical spine: Negative

Sinuses/Orbits: Clear/normal

Other: None

MRA HEAD FINDINGS

Both internal carotid arteries are widely patent through the skull
base and siphon regions. The anterior and middle cerebral vessels
appear normal. No aneurysm, vascular malformation, proximal stenosis
or large vessel occlusion.

Small right vertebral artery does not show flow through the foramen
magnum. Large left vertebral artery widely patent to the basilar. No
retrograde flow seen in the right vertebral artery. Left PICA is
patent. Right anterior inferior cerebellar artery is patent.
Superior cerebellar and posterior cerebral arteries show flow.
IMPRESSION: No acute brain finding. Chronic small-vessel ischemic changes as
outlined above, advanced for age.

Intracranial anterior circulation vessels appear normal by MR
angiography.

No antegrade flow is seen in the small right vertebral artery beyond
the foramen magnum. Large left vertebral artery widely patent to the
basilar. Patent posterior circulation branch vessels as outlined
above. No sign of any infarction at parenchymal imaging on the basis
of absent right vertebral artery flow.

## 2020-10-17 ENCOUNTER — Telehealth: Payer: Self-pay

## 2020-10-17 NOTE — Telephone Encounter (Signed)
Pt called no answer no voice mail set up 

## 2020-10-17 NOTE — Telephone Encounter (Signed)
-----   Message from Adam R Jaffe, DO sent at 10/14/2020  7:27 AM EDT ----- Brain shows chronic age-related changes but no new/recent stroke or other acute abnormality.  Blood vessels in brain look okay but there is suggestion of possible decreased blood flow of one artery in the neck - I did order MRA of neck which appears to not have been ordered - I would like that ordered for evaluation of pulsatile tinnitus and right vertebral artery occlusion 

## 2020-10-18 ENCOUNTER — Telehealth: Payer: Self-pay

## 2020-10-18 NOTE — Telephone Encounter (Signed)
I initially wanted MRA of the neck as well.  I don't know why it wasn't ordered.  However, it is part of the workup for the dizziness.  Would she be open to CTA of neck, which may be cheaper?

## 2020-10-18 NOTE — Telephone Encounter (Signed)
Called patient and informed her of results of her MRI and recommendations per Dr. Tomi Likens. Patient stated that she is NOT wanting to do the MRA of her neck because she had to pay $180 for the MRI of her brain. Patient asked why it wasn't done with her MRI of her brain. I informed patient that the MRI was to look at her brain as a MRA of her neck focuses on the vessels and arteries in her neck and that they were two different types of test.   Patient stated she didn't think it was related and doesn't want to do it. I informed patient again that while her MRI was being done it showed decreased flood flow in an artery to her neck so it is recommended. Patient still declined. Informed patient that I would let Dr. Tomi Likens know and if there was an issue I would call her back.   Informed patient that if she changed her mind to call us back and we would order her MRA of her neck.

## 2020-10-18 NOTE — Telephone Encounter (Signed)
-----   Message from Pieter Partridge, DO sent at 10/14/2020  7:27 AM EDT ----- Brain shows chronic age-related changes but no new/recent stroke or other acute abnormality.  Blood vessels in brain look okay but there is suggestion of possible decreased blood flow of one artery in the neck - I did order MRA of neck which appears to not have been ordered - I would like that ordered for evaluation of pulsatile tinnitus and right vertebral artery occlusion

## 2020-12-11 ENCOUNTER — Other Ambulatory Visit: Payer: Self-pay | Admitting: Internal Medicine

## 2020-12-11 DIAGNOSIS — J309 Allergic rhinitis, unspecified: Secondary | ICD-10-CM

## 2020-12-20 ENCOUNTER — Telehealth: Payer: Self-pay | Admitting: Adult Health

## 2020-12-20 ENCOUNTER — Other Ambulatory Visit: Payer: Self-pay

## 2020-12-20 DIAGNOSIS — F411 Generalized anxiety disorder: Secondary | ICD-10-CM

## 2020-12-20 DIAGNOSIS — F331 Major depressive disorder, recurrent, moderate: Secondary | ICD-10-CM

## 2020-12-20 MED ORDER — DESVENLAFAXINE SUCCINATE ER 50 MG PO TB24: 50.0000 mg | ORAL_TABLET | Freq: Every day | ORAL | 0 refills | Status: DC

## 2020-12-20 NOTE — Telephone Encounter (Signed)
Next visit is 12/27/20. Requesting a refill on Pristiq called to:   [facility] # [id number] [place]   Phone: [phone]  Fax: [phone]

## 2020-12-20 NOTE — Telephone Encounter (Signed)
Next visit is 12/27/20. Requesting a refill on Pristiq called to:  [facility] # [id number] [place]  Phone:  [phone]  Fax:  [phone]

## 2020-12-20 NOTE — Telephone Encounter (Signed)
Rx sent 

## 2020-12-27 ENCOUNTER — Encounter: Payer: Self-pay | Admitting: Adult Health

## 2020-12-27 ENCOUNTER — Other Ambulatory Visit: Payer: Self-pay

## 2020-12-27 ENCOUNTER — Ambulatory Visit: Payer: Medicare HMO | Admitting: Adult Health

## 2020-12-27 DIAGNOSIS — F411 Generalized anxiety disorder: Secondary | ICD-10-CM

## 2020-12-27 DIAGNOSIS — F331 Major depressive disorder, recurrent, moderate: Secondary | ICD-10-CM | POA: Diagnosis not present

## 2020-12-27 MED ORDER — DESVENLAFAXINE SUCCINATE ER 50 MG PO TB24: 50.0000 mg | ORAL_TABLET | Freq: Every day | ORAL | 2 refills | Status: DC

## 2020-12-27 NOTE — Progress Notes (Signed)
[name] 1956-04-10 65 y.o.  Subjective:   Patient ID:  Kimberly Conway is a 65 y.o. (DOB [birth year]) female.  Chief Complaint: No chief complaint on file.   HPI Kimberly Conway presents to the office today for follow-up of MDD and GAD.  Describes mood today as "ok". Pleasant. Denies tearfulness. Mood symptoms - denies depression, anxiety, and irritability. Stating "I'm doing good". Feels like medications are working well. Stable interest and motivation. Taking medications as prescribed..  Energy levels stable. Active, has a regular exercise routine.  Enjoys some usual interests and activities. Single. Lives alone with 2 cats. No children. Has a sister local - sees her on Saturdays. Other family lives in [place], [place].  Appetite adequate. Weight 160 pounds, 61". Sleeps well most nights. Averages 7 to 8 hours. Focus and concentration stable. Completing tasks. Managing aspects of household. Works full time for a [employer] - 40 hours a week.  Denies SI or HI.  Denies AH or VH.  Previous medication trials: Ativan, Pristiq   PHQ2-9    [place] Office Visit from 05/01/2020 in [place] at [facility] from 09/21/2019 in [place] at [facility] from 06/22/2019 in [place] at [facility] Total Score 0 2 0  PHQ-9 Total Score 0 5 4        Review of Systems:  Review of Systems  Musculoskeletal:  Negative for gait problem.  Neurological:  Negative for tremors.  Psychiatric/Behavioral:         Please refer to HPI   Medications: I have reviewed the patient's current medications.  Current Outpatient Medications  Medication Sig Dispense Refill   b complex vitamins tablet Take 1 tablet by mouth daily.     Calcium 500-125 MG-UNIT TABS Take by mouth.     Calcium Citrate (CITRACAL PO) Take 1,200 mg by mouth 3 (three) times daily.      desvenlafaxine (PRISTIQ) 50 MG 24 hr tablet Take 1 tablet (50 mg total) by  mouth daily. 90 tablet 2   fluticasone (FLONASE) 50 MCG/ACT nasal spray USE 2 SPRAYS IN EACH NOSTRIL EVERY DAY 48 g 0   glucosamine-chondroitin 500-400 MG tablet Take 1 tablet by mouth daily.     hydrocortisone 2.5 % cream Apply topically 2 (two) times daily as needed (rash). okay to use on the face, neck, groin area. Do not use more than 1 week at a time. (Patient not taking: Reported on 09/19/2020) 30 g 2   levocetirizine (XYZAL) 5 MG tablet TAKE 1 TABLET (5 MG TOTAL) BY MOUTH EVERY EVENING. 90 tablet 1   LORazepam (ATIVAN) 0.5 MG tablet Take 1 tablet (0.5 mg total) by mouth daily as needed. 30 tablet 2   metoprolol succinate (TOPROL-XL) 25 MG 24 hr tablet Take 25 mg by mouth. Take half tablet daily     Multiple Vitamin (MULTIVITAMIN) tablet Take 1 tablet by mouth daily.     omega-3 fish oil (MAXEPA) 1000 MG CAPS capsule Take 1 capsule by mouth daily.      omeprazole (PRILOSEC) 20 MG capsule Take 1 capsule (20 mg total) by mouth 2 (two) times daily before a meal. 180 capsule 3   OVER THE COUNTER MEDICATION Glucosamine 1500 mg  Chon 1200 mg (Patient not taking: Reported on 09/19/2020)     Probiotic Product (PROBIOTIC-10 PO) Take by mouth.     triamcinolone ointment (KENALOG) 0.1 % Apply 1 application topically 2 (two) times daily as needed (rash). Do  not use on the face, neck, armpits or groin area. Do not use more than 3 weeks in a row. (Patient not taking: Reported on 09/19/2020) 60 g 1   No current facility-administered medications for this visit.    Medication Side Effects: None  Allergies: No Known Allergies  Past Medical History:  Diagnosis Date   Depression    GAD (generalized anxiety disorder)    GERD (gastroesophageal reflux disease)    HTN (hypertension)    Multiple thyroid nodules    Other atopic dermatitis 08/17/2020   Seasonal allergies    Urticaria     Past Medical History, Surgical history, Social history, and Family history were reviewed and updated as appropriate.    Please see review of systems for further details on the patient's review from today.   Objective:   Physical Exam:  There were no vitals taken for this visit.  Physical Exam Constitutional:      General: She is not in acute distress. Musculoskeletal:        General: No deformity.  Neurological:     Mental Status: She is alert and oriented to person, place, and time.     Coordination: Coordination normal.  Psychiatric:        Attention and Perception: Attention and perception normal. She does not perceive auditory or visual hallucinations.        Mood and Affect: Mood normal. Mood is not anxious or depressed. Affect is not labile, blunt, angry or inappropriate.        Speech: Speech normal.        Behavior: Behavior normal.        Thought Content: Thought content normal. Thought content is not paranoid or delusional. Thought content does not include homicidal or suicidal ideation. Thought content does not include homicidal or suicidal plan.        Cognition and Memory: Cognition and memory normal.        Judgment: Judgment normal.     Comments: Insight intact    Lab Review:     Component Value Date/Time   NA 138 09/21/2019 1132   K 4.0 09/21/2019 1132   CL 102 09/21/2019 1132   CO2 30 09/21/2019 1132   GLUCOSE 96 09/21/2019 1132   BUN 13 09/21/2019 1132   CREATININE 0.76 09/21/2019 1132   CALCIUM 9.3 09/21/2019 1132   PROT 7.5 05/01/2020 0000   ALBUMIN 4.4 09/21/2019 1132   AST 20 09/21/2019 1132   ALT 26 09/21/2019 1132   ALKPHOS 113 09/21/2019 1132   BILITOT 0.7 09/21/2019 1132       Component Value Date/Time   WBC 5.4 09/21/2019 1132   RBC 4.27 09/21/2019 1132   HGB 14.0 09/21/2019 1132   HCT 39.8 09/21/2019 1132   PLT 279.0 09/21/2019 1132   MCV 93.1 09/21/2019 1132   MCHC 35.2 09/21/2019 1132   RDW 12.5 09/21/2019 1132   LYMPHSABS 1.9 09/21/2019 1132   MONOABS 0.4 09/21/2019 1132   EOSABS 0.1 09/21/2019 1132   BASOSABS 0.0 09/21/2019 1132    No  results found for: POCLITH, LITHIUM   No results found for: PHENYTOIN, PHENOBARB, VALPROATE, CBMZ   .res Assessment: Plan:    Plan:  PDMP reviewed  1. Pristiq '50mg'$  daily 2. Ativan 0.'5mg'$  daily as needed  Read and reviewed note with patient for accuracy.   RTC 6/12 months  Patient advised to contact office with any questions, adverse effects, or acute worsening in signs and symptoms.  Discussed potential benefits, risk,  and side effects of benzodiazepines to include potential risk of tolerance and dependence, as well as possible drowsiness.  Advised patient not to drive if experiencing drowsiness and to take lowest possible effective dose to minimize risk of dependence and tolerance.  Diagnoses and all orders for this visit:  Major depressive disorder, recurrent episode, moderate (HCC) -     desvenlafaxine (PRISTIQ) 50 MG 24 hr tablet; Take 1 tablet (50 mg total) by mouth daily.  Generalized anxiety disorder -     desvenlafaxine (PRISTIQ) 50 MG 24 hr tablet; Take 1 tablet (50 mg total) by mouth daily.    Please see After Visit Summary for patient specific instructions.  Future Appointments  Date Time Provider [place]  01/24/2021 10:30 AM Isaac Bliss, Rayford Halsted, MD [id number]    No orders of the defined types were placed in this encounter.   -------------------------------

## 2021-01-16 DIAGNOSIS — H524 Presbyopia: Secondary | ICD-10-CM | POA: Diagnosis not present

## 2021-01-16 DIAGNOSIS — Z0100 Encounter for examination of eyes and vision without abnormal findings: Secondary | ICD-10-CM | POA: Diagnosis not present

## 2021-01-23 ENCOUNTER — Emergency Department (HOSPITAL_COMMUNITY): Payer: Medicare HMO

## 2021-01-23 ENCOUNTER — Telehealth (HOSPITAL_COMMUNITY): Payer: Self-pay | Admitting: Radiology

## 2021-01-23 ENCOUNTER — Other Ambulatory Visit (HOSPITAL_COMMUNITY): Payer: Self-pay | Admitting: Neuroradiology

## 2021-01-23 ENCOUNTER — Other Ambulatory Visit: Payer: Self-pay

## 2021-01-23 ENCOUNTER — Encounter: Payer: Self-pay | Admitting: Internal Medicine

## 2021-01-23 ENCOUNTER — Telehealth (INDEPENDENT_AMBULATORY_CARE_PROVIDER_SITE_OTHER): Payer: Medicare HMO | Admitting: Internal Medicine

## 2021-01-23 ENCOUNTER — Encounter (HOSPITAL_COMMUNITY): Payer: Self-pay

## 2021-01-23 ENCOUNTER — Emergency Department (HOSPITAL_COMMUNITY)
Admission: EM | Admit: 2021-01-23 | Discharge: 2021-01-23 | Disposition: A | Discharge status: Home / Self Care | Payer: Medicare HMO | Attending: Emergency Medicine | Admitting: Emergency Medicine

## 2021-01-23 DIAGNOSIS — R42 Dizziness and giddiness: Secondary | ICD-10-CM

## 2021-01-23 DIAGNOSIS — Z79899 Other long term (current) drug therapy: Secondary | ICD-10-CM | POA: Insufficient documentation

## 2021-01-23 DIAGNOSIS — R519 Headache, unspecified: Secondary | ICD-10-CM

## 2021-01-23 DIAGNOSIS — Z87891 Personal history of nicotine dependence: Secondary | ICD-10-CM | POA: Diagnosis not present

## 2021-01-23 DIAGNOSIS — R270 Ataxia, unspecified: Secondary | ICD-10-CM | POA: Diagnosis not present

## 2021-01-23 DIAGNOSIS — I671 Cerebral aneurysm, nonruptured: Secondary | ICD-10-CM | POA: Insufficient documentation

## 2021-01-23 DIAGNOSIS — I10 Essential (primary) hypertension: Secondary | ICD-10-CM | POA: Insufficient documentation

## 2021-01-23 HISTORY — DX: Dizziness and giddiness: R42

## 2021-01-23 LAB — COMPREHENSIVE METABOLIC PANEL
ALT: 28 U/L (ref 0–44)
AST: 32 U/L (ref 15–41)
Albumin: 4.2 g/dL (ref 3.5–5.0)
Alkaline Phosphatase: 113 U/L (ref 38–126)
Anion gap: 8 (ref 5–15)
BUN: 12 mg/dL (ref 8–23)
CO2: 24 mmol/L (ref 22–32)
Calcium: 9.4 mg/dL (ref 8.9–10.3)
Chloride: 106 mmol/L (ref 98–111)
Creatinine, Ser: 0.71 mg/dL (ref 0.44–1.00)
GFR, Estimated: >60 mL/min (ref >60)
Glucose, Bld: 98 mg/dL (ref 70–99)
Potassium: 4.5 mmol/L (ref 3.5–5.1)
Sodium: 138 mmol/L (ref 135–145)
Total Bilirubin: 1 mg/dL (ref 0.3–1.2)
Total Protein: 7.7 g/dL (ref 6.5–8.1)

## 2021-01-23 LAB — CBC WITH DIFFERENTIAL/PLATELET
Abs Immature Granulocytes: 0.01 10*3/uL (ref 0.00–0.07)
Basophils Absolute: 0 10*3/uL (ref 0.0–0.1)
Basophils Relative: 0 %
Eosinophils Absolute: 0.1 10*3/uL (ref 0.0–0.5)
Eosinophils Relative: 2 %
HCT: 43.2 % (ref 36.0–46.0)
Hemoglobin: 14.6 g/dL (ref 12.0–15.0)
Immature Granulocytes: 0 %
Lymphocytes Relative: 30 %
Lymphs Abs: 1.5 10*3/uL (ref 0.7–4.0)
MCH: 31.4 pg (ref 26.0–34.0)
MCHC: 33.8 g/dL (ref 30.0–36.0)
MCV: 92.9 fL (ref 80.0–100.0)
Monocytes Absolute: 0.3 10*3/uL (ref 0.1–1.0)
Monocytes Relative: 7 %
Neutro Abs: 3 10*3/uL (ref 1.7–7.7)
Neutrophils Relative %: 61 %
Platelets: 299 10*3/uL (ref 150–400)
RBC: 4.65 MIL/uL (ref 3.87–5.11)
RDW: 12.6 % (ref 11.5–15.5)
WBC: 4.9 10*3/uL (ref 4.0–10.5)
nRBC: 0 % (ref 0.0–0.2)

## 2021-01-23 IMAGING — MR MR MRA NECK W/O CM
12 of 15 series · 30 of 48 positions shown · non-contrast
Comparison: MR Head [DATE]

EXAM:
MRI HEAD WITHOUT CONTRAST

MRA HEAD WITHOUT CONTRAST
MRA OF THE NECK WITHOUT CONTRAST
TECHNIQUE: Multiplanar, multi-echo pulse sequences of the brain and surrounding
structures were acquired without intravenous contrast. Angiographic
images of the Circle of Willis were acquired using MRA technique
without intravenous contrast. Angiographic images of the neck were
acquired using MRA technique without intravenous contrast. Carotid
stenosis measurements (when applicable) are obtained utilizing
NASCET criteria, using the distal internal carotid diameter as the
denominator.

[Series 5: dwi_tracew · axial · 3.0mm · 1.08mm/px · z∈[-68,+73]mm · 5 of 102 slices shown]
[im 1/102]
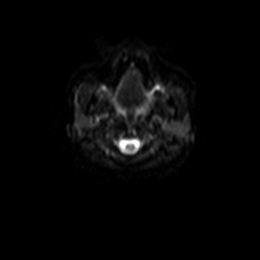
[im 26/102]
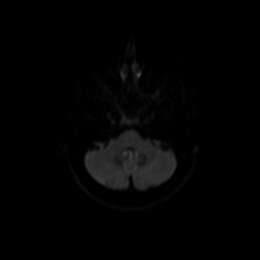
[im 51/102]
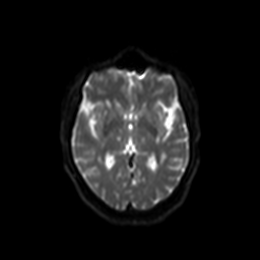
[im 76/102]
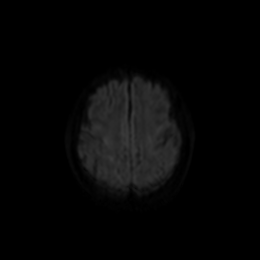
[im 102/102]
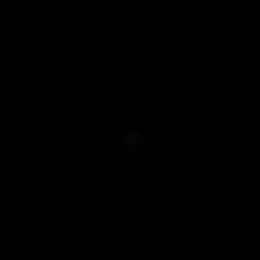

[Series 6: dwi_adc · axial · 3.0mm · 1.08mm/px · z∈[-68,+73]mm · 3 of 51 slices shown]
[im 1/51]
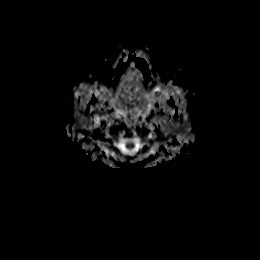
[im 26/51]
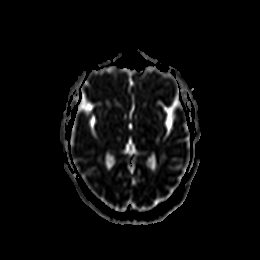
[im 51/51]
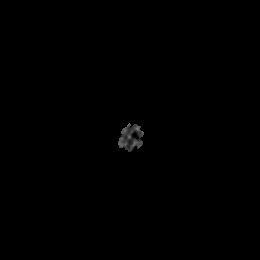

[Series 7: T2 · sagittal · 5.0mm · 0.47mm/px · 1 of 24 slices shown (1 of 4)]
[im 1/24]
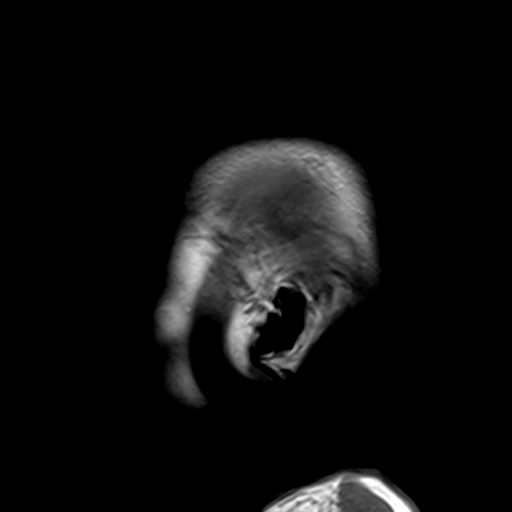

[Series 8: T2 · axial · 5.0mm · 0.45mm/px · 1 of 24 slices shown (2 of 4)]
[im 1/24]
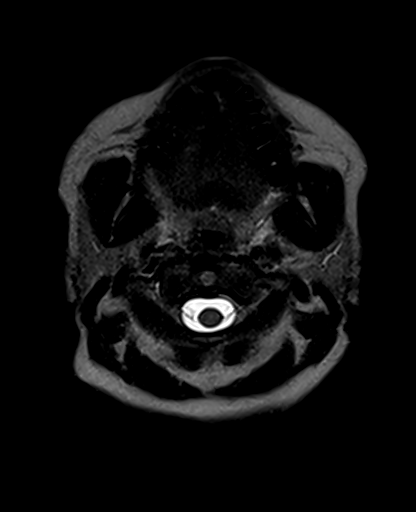

[Series 8: T2 · axial · 5.0mm · 0.45mm/px · 1 of 24 slices shown (3 of 4)]
[im 1/24]
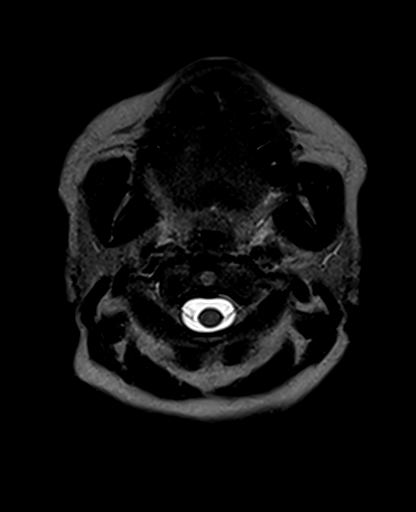

[Series 9: FLAIR · axial · 3.0mm · 0.86mm/px · z∈[-64,+75]mm · 3 of 51 slices shown]
[im 1/51]
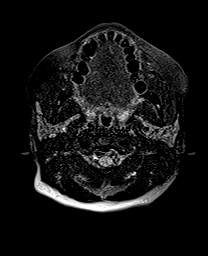
[im 26/51]
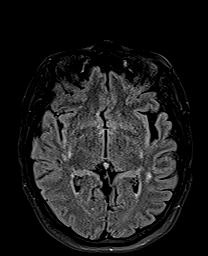
[im 51/51]
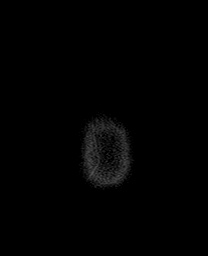

[Series 10: GRE · axial · 3.0mm · 0.45mm/px · z∈[-58,+83]mm · 3 of 51 slices shown]
[im 1/51]
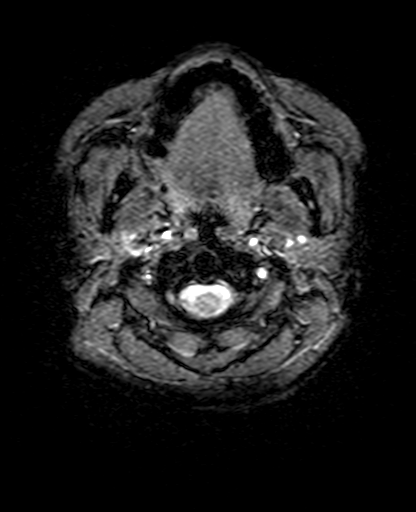
[im 26/51]
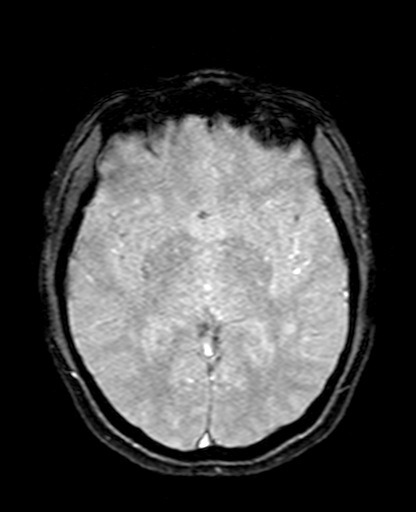
[im 51/51]
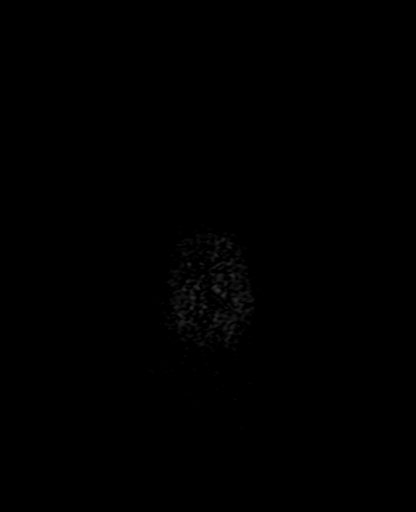

[Series 11: T1 · axial · 3.0mm · 0.45mm/px · z∈[-58,+83]mm · 3 of 51 slices shown]
[im 1/51]
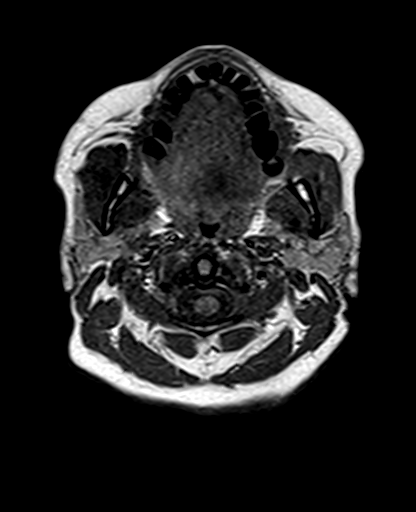
[im 26/51]
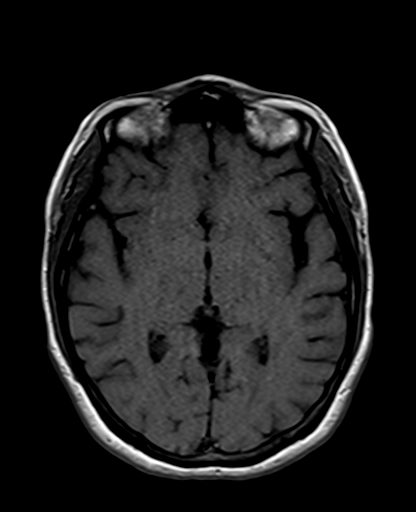
[im 51/51]
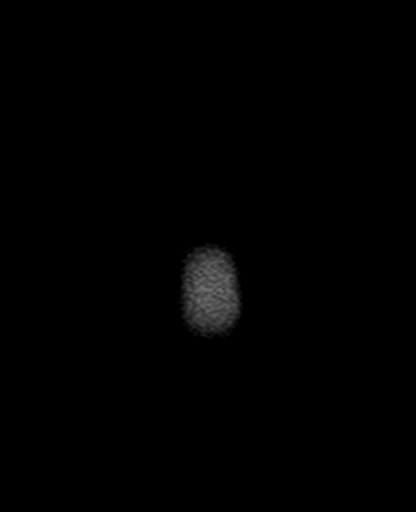

[Series 12: DWI · coronal · 5.0mm · 1.31mm/px · 3 of 52 slices shown]
[im 1/52]
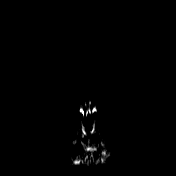
[im 26/52]
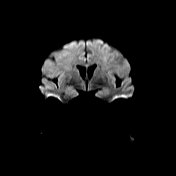
[im 52/52]
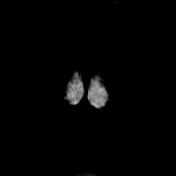

[Series 14: T2 · coronal · 5.0mm · 0.86mm/px · 2 of 26 slices shown (4 of 4)]
[im 1/26]
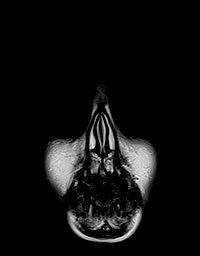
[im 26/26]
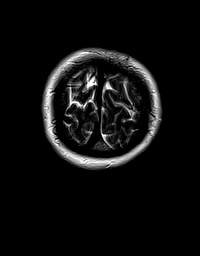

[Series 28: tof_2d_tra · axial · 3.5mm · 0.43mm/px · z∈[-173,-40]mm · 3 of 60 slices shown]
[im 1/60]
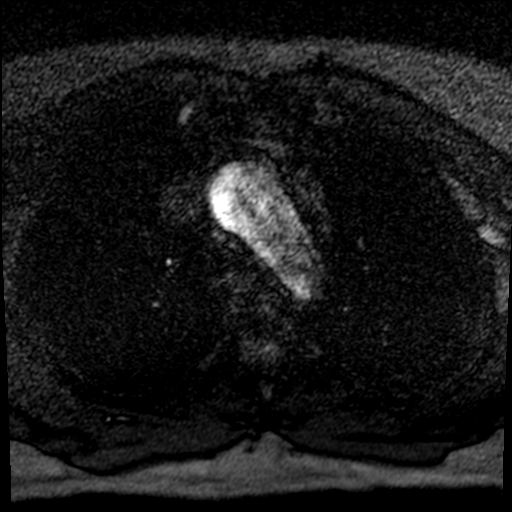
[im 30/60]
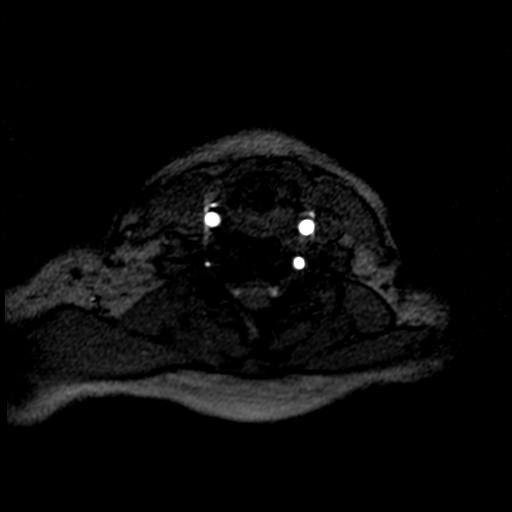
[im 60/60]
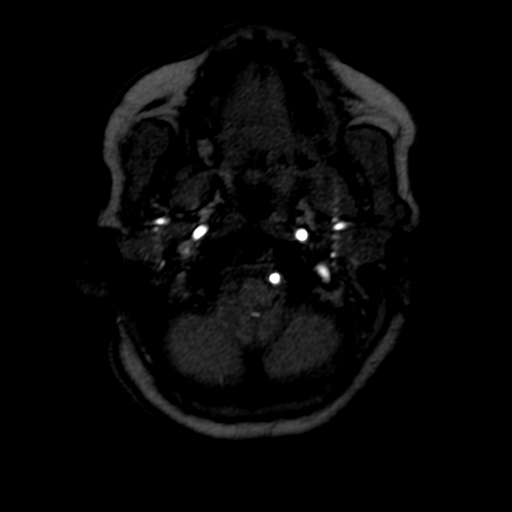

[Series 33: tof_3d_multi-slab-bifurcation · axial · 1.0mm · 0.26mm/px · z∈[-157,-139]mm · 2 of 136 slices shown]
[im 1/136]
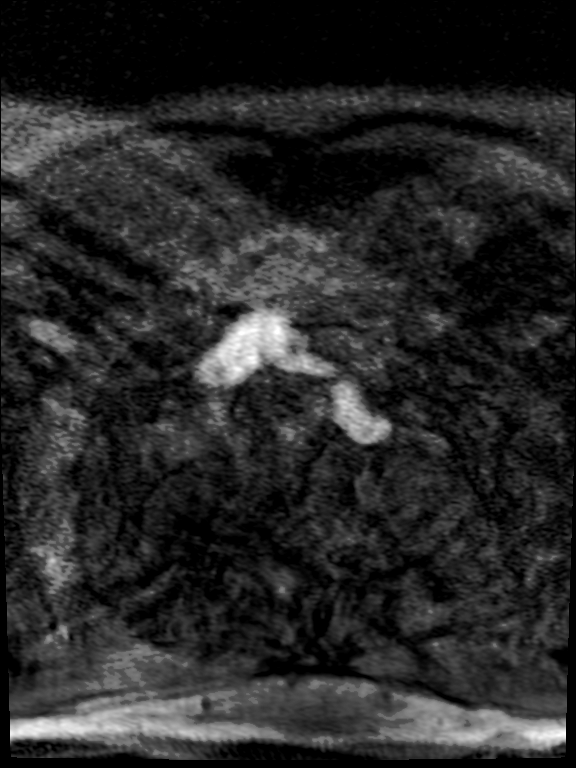
[im 20/136]
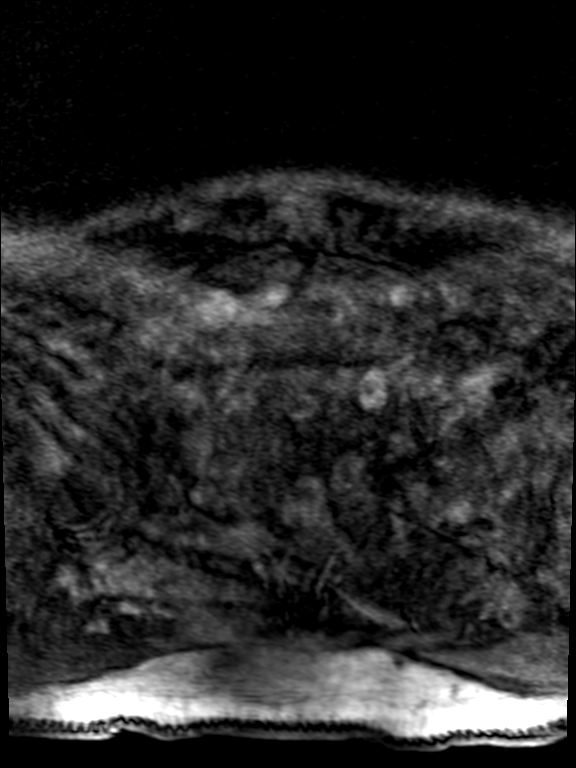

[30 of 48 positions shown; findings below may reference images not displayed]

FINDINGS: MR HEAD FINDINGS

Brain: There is no evidence of acute intracranial hemorrhage,
extra-axial fluid collection, or infarct.

Extensive foci of FLAIR signal abnormality are again seen throughout
the subcortical and periventricular white matter, similar to the
prior study. The ventricles are not enlarged. There is no mass
lesion. There is no midline shift.

Vascular: See below

Skull and upper cervical spine: Normal marrow signal.

Sinuses/Orbits: The paranasal sinuses are clear. The globes and
orbits are unremarkable.

Other: There is degenerative change of the temporomandibular joints,
right worse than left.

MRA HEAD FINDINGS

Anterior circulation: There is a 5-6 mm inferomedially directed
aneurysm arising from the cavernous segment of the right ICA (22-82,
see key images). The bilateral cavernous ICAs are patent, with no
significant stenosis or occlusion. The bilateral ACAs are patent.
The bilateral MCAs are patent. No other aneurysm is identified.

Posterior circulation: The right V4 segment is again not identified
on the time-of-flight images the left V4 segment is widely patent.
The basilar artery is patent. The bilateral PCAs are patent. No
aneurysm is identified.

MRA NECK FINDINGS

Aortic arch: The aortic arch is unremarkable.

Right carotid system: Patent, with no hemodynamically significant
stenosis, occlusion, dissection, or aneurysm.

Left carotid system: Patent, with no hemodynamically significant
stenosis, occlusion, dissection, or aneurysm.

Vertebral arteries: There is diminutive flow in the cervical portion
of the right vertebral artery. The left vertebral artery is patent.

Other: None.
IMPRESSION: 1. No acute intracranial pathology.
2. Unchanged extensive foci of FLAIR signal abnormality throughout
the supratentorial white matter, nonspecific but may reflect sequela
of chronic white matter microangiopathy, advanced for age.
3. Diminutive flow related enhancement throughout the cervical
portion of the right vertebral artery and non enhancement of the V4
segment (the V4 segment was also nonenhancing on the prior study).
This may reflect anatomic variant, chronic dissection, or multifocal
atherosclerotic disease. CTA neck would be helpful to evaluate for
atherosclerotic plaque.
4. Otherwise patent vasculature of the head and neck with no other
significant stenosis or occlusion.
5. 5-6 mm aneurysm of the right cavernous ICA; neurosurgical
consultation is recommended for management.

## 2021-01-23 IMAGING — MR MR HEAD W/O CM
12 of 16 series · 29 of 48 positions shown · non-contrast
Comparison: MR Head [DATE]

EXAM:
MRI HEAD WITHOUT CONTRAST

MRA HEAD WITHOUT CONTRAST
MRA OF THE NECK WITHOUT CONTRAST
TECHNIQUE: Multiplanar, multi-echo pulse sequences of the brain and surrounding
structures were acquired without intravenous contrast. Angiographic
images of the Circle of Willis were acquired using MRA technique
without intravenous contrast. Angiographic images of the neck were
acquired using MRA technique without intravenous contrast. Carotid
stenosis measurements (when applicable) are obtained utilizing
NASCET criteria, using the distal internal carotid diameter as the
denominator.

[Series 5: dwi_tracew · axial · 3.0mm · 1.08mm/px · z∈[-68,+73]mm · 5 of 102 slices shown]
[im 1/102]
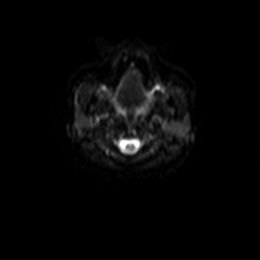
[im 26/102]
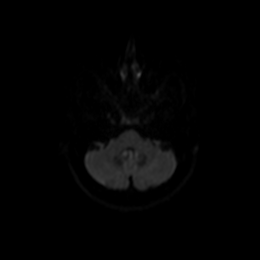
[im 51/102]
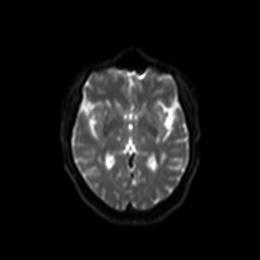
[im 76/102]
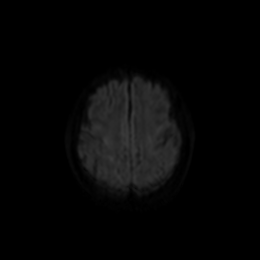
[im 102/102]
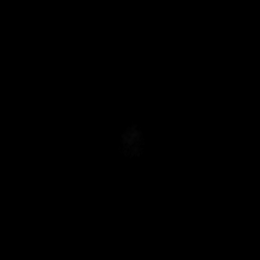

[Series 6: dwi_adc · axial · 3.0mm · 1.08mm/px · z∈[-68,+73]mm · 2 of 51 slices shown]
[im 1/51]
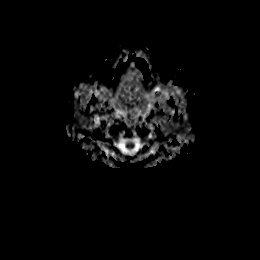
[im 51/51]
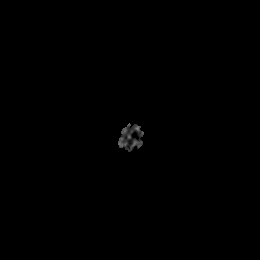

[Series 7: T2 · sagittal · 5.0mm · 0.47mm/px · 1 of 24 slices shown (1 of 3)]
[im 1/24]
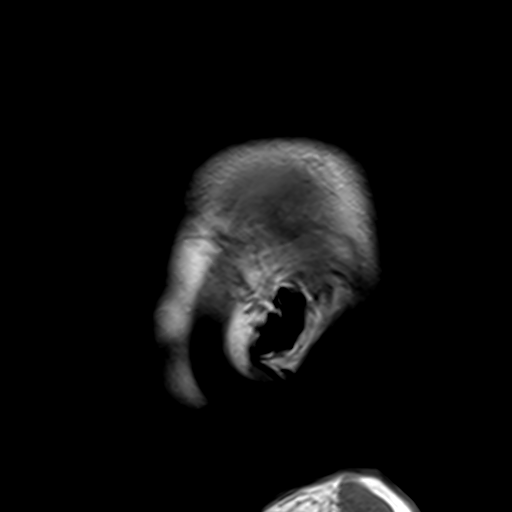

[Series 8: T2 · axial · 5.0mm · 0.45mm/px · 1 of 24 slices shown (2 of 3)]
[im 1/24]
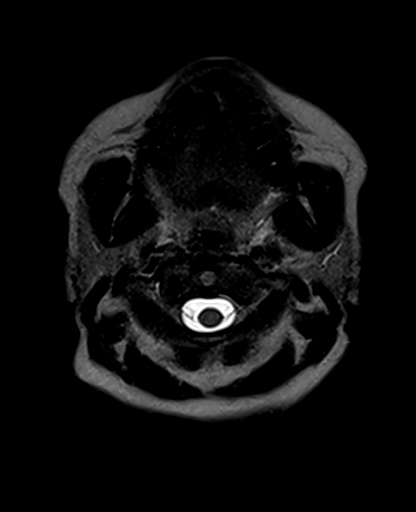

[Series 9: FLAIR · axial · 3.0mm · 0.86mm/px · z∈[-64,+75]mm · 2 of 51 slices shown]
[im 1/51]
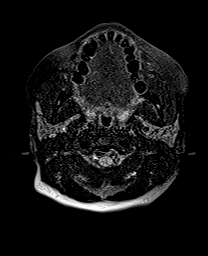
[im 51/51]
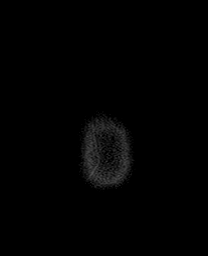

[Series 10: GRE · axial · 3.0mm · 0.45mm/px · z∈[-58,+83]mm · 3 of 51 slices shown]
[im 1/51]
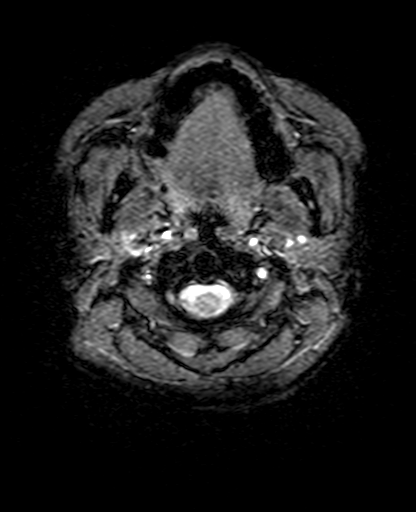
[im 26/51]
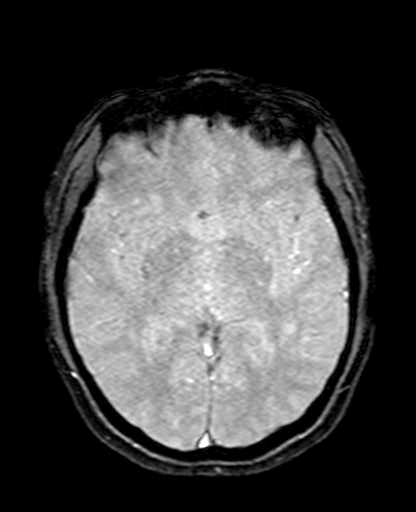
[im 51/51]
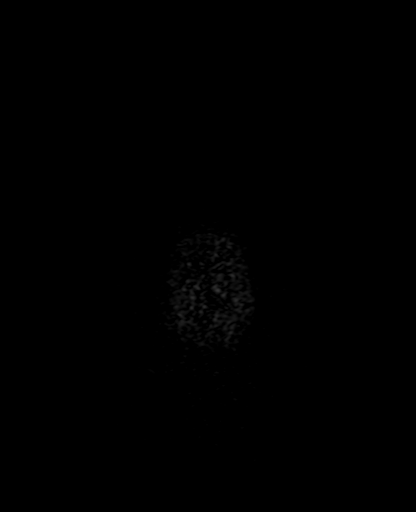

[Series 11: T1 · axial · 3.0mm · 0.45mm/px · z∈[-58,+83]mm · 3 of 51 slices shown]
[im 1/51]
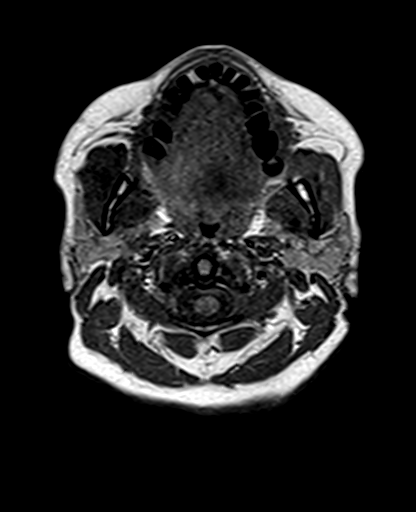
[im 26/51]
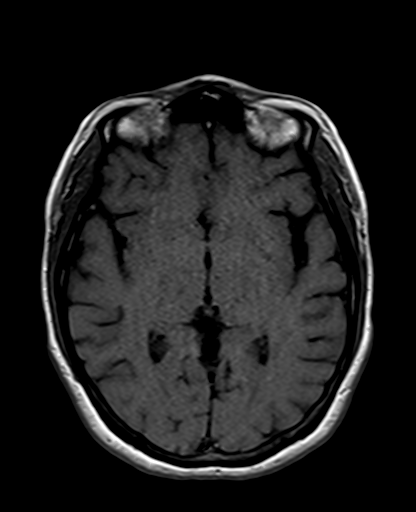
[im 51/51]
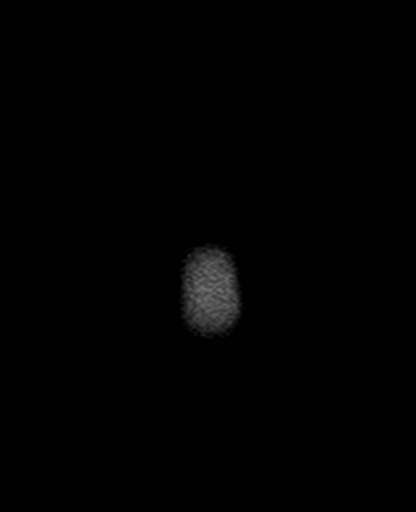

[Series 12: DWI · coronal · 5.0mm · 1.31mm/px · 3 of 52 slices shown (1 of 2)]
[im 1/52]
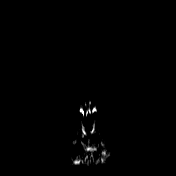
[im 26/52]
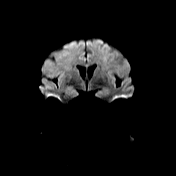
[im 52/52]
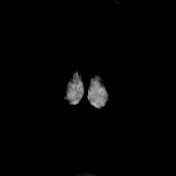

[Series 13: DWI · coronal · 5.0mm · 1.31mm/px · 2 of 26 slices shown (2 of 2)]
[im 1/26]
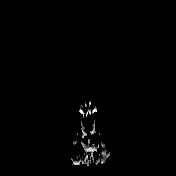
[im 26/26]
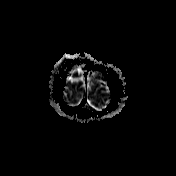

[Series 14: T2 · coronal · 5.0mm · 0.86mm/px · 2 of 26 slices shown (3 of 3)]
[im 1/26]
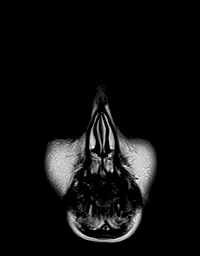
[im 26/26]
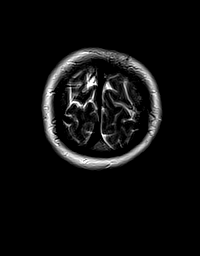

[Series 28: tof_2d_tra · axial · 3.5mm · 0.43mm/px · z∈[-173,-40]mm · 3 of 60 slices shown]
[im 1/60]
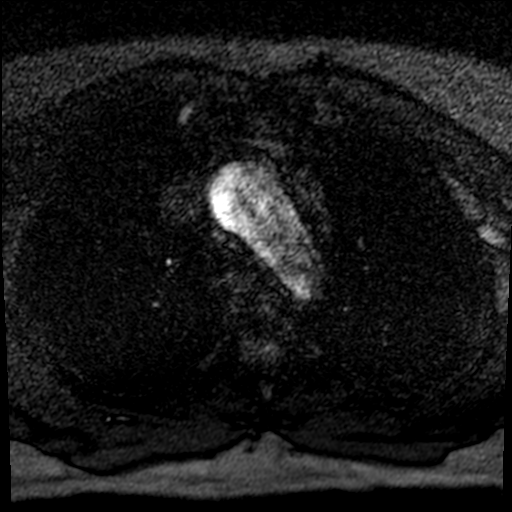
[im 30/60]
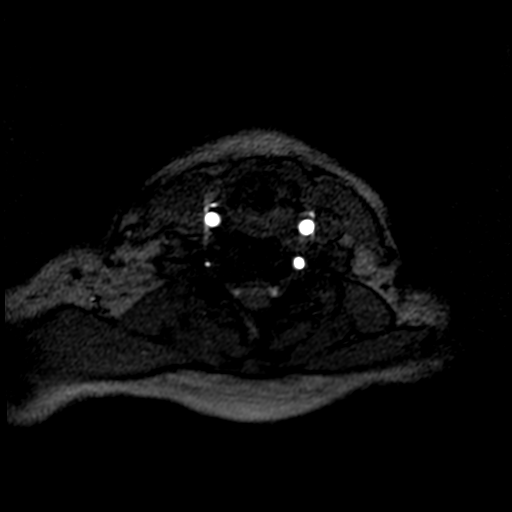
[im 60/60]
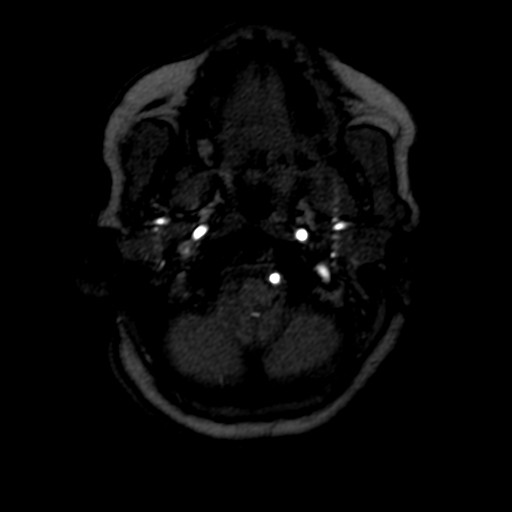

[Series 33: tof_3d_multi-slab-bifurcation · axial · 1.0mm · 0.26mm/px · z∈[-157,-139]mm · 2 of 136 slices shown]
[im 1/136]
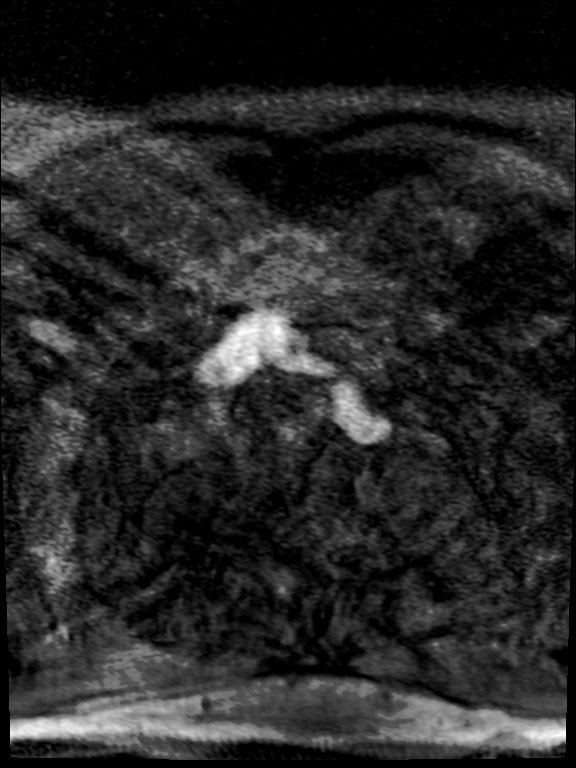
[im 20/136]
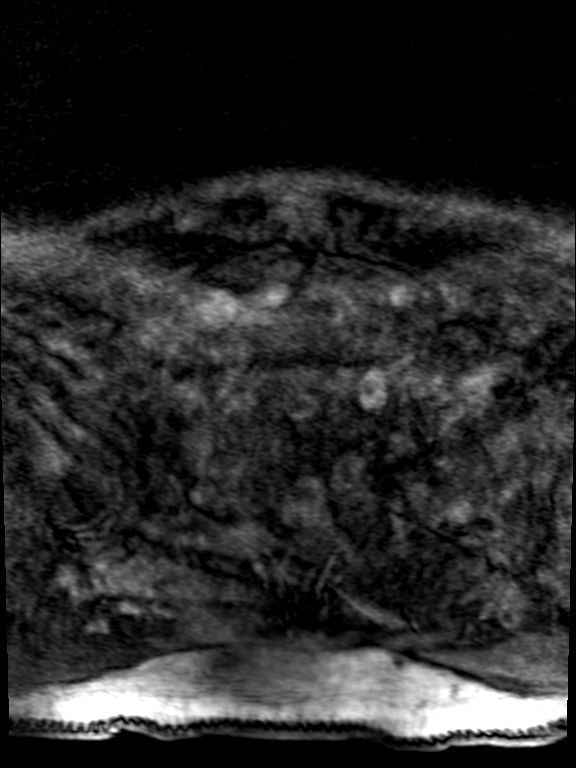

[29 of 48 positions shown; findings below may reference images not displayed]

FINDINGS: MR HEAD FINDINGS

Brain: There is no evidence of acute intracranial hemorrhage,
extra-axial fluid collection, or infarct.

Extensive foci of FLAIR signal abnormality are again seen throughout
the subcortical and periventricular white matter, similar to the
prior study. The ventricles are not enlarged. There is no mass
lesion. There is no midline shift.

Vascular: See below

Skull and upper cervical spine: Normal marrow signal.

Sinuses/Orbits: The paranasal sinuses are clear. The globes and
orbits are unremarkable.

Other: There is degenerative change of the temporomandibular joints,
right worse than left.

MRA HEAD FINDINGS

Anterior circulation: There is a 5-6 mm inferomedially directed
aneurysm arising from the cavernous segment of the right ICA (22-82,
see key images). The bilateral cavernous ICAs are patent, with no
significant stenosis or occlusion. The bilateral ACAs are patent.
The bilateral MCAs are patent. No other aneurysm is identified.

Posterior circulation: The right V4 segment is again not identified
on the time-of-flight images the left V4 segment is widely patent.
The basilar artery is patent. The bilateral PCAs are patent. No
aneurysm is identified.

MRA NECK FINDINGS

Aortic arch: The aortic arch is unremarkable.

Right carotid system: Patent, with no hemodynamically significant
stenosis, occlusion, dissection, or aneurysm.

Left carotid system: Patent, with no hemodynamically significant
stenosis, occlusion, dissection, or aneurysm.

Vertebral arteries: There is diminutive flow in the cervical portion
of the right vertebral artery. The left vertebral artery is patent.

Other: None.
IMPRESSION: 1. No acute intracranial pathology.
2. Unchanged extensive foci of FLAIR signal abnormality throughout
the supratentorial white matter, nonspecific but may reflect sequela
of chronic white matter microangiopathy, advanced for age.
3. Diminutive flow related enhancement throughout the cervical
portion of the right vertebral artery and non enhancement of the V4
segment (the V4 segment was also nonenhancing on the prior study).
This may reflect anatomic variant, chronic dissection, or multifocal
atherosclerotic disease. CTA neck would be helpful to evaluate for
atherosclerotic plaque.
4. Otherwise patent vasculature of the head and neck with no other
significant stenosis or occlusion.
5. 5-6 mm aneurysm of the right cavernous ICA; neurosurgical
consultation is recommended for management.

## 2021-01-23 IMAGING — MR MR MRA HEAD W/O CM
1 series · 22 of 24 positions shown · non-contrast
Comparison: MR Head [DATE]

EXAM:
MRI HEAD WITHOUT CONTRAST

MRA HEAD WITHOUT CONTRAST
MRA OF THE NECK WITHOUT CONTRAST
TECHNIQUE: Multiplanar, multi-echo pulse sequences of the brain and surrounding
structures were acquired without intravenous contrast. Angiographic
images of the Circle of Willis were acquired using MRA technique
without intravenous contrast. Angiographic images of the neck were
acquired using MRA technique without intravenous contrast. Carotid
stenosis measurements (when applicable) are obtained utilizing
NASCET criteria, using the distal internal carotid diameter as the
denominator.

[Series 8: T2 · axial · 5.0mm · 0.45mm/px · z∈[-66,+67]mm · 22 of 24 slices shown]
[im 1/24]
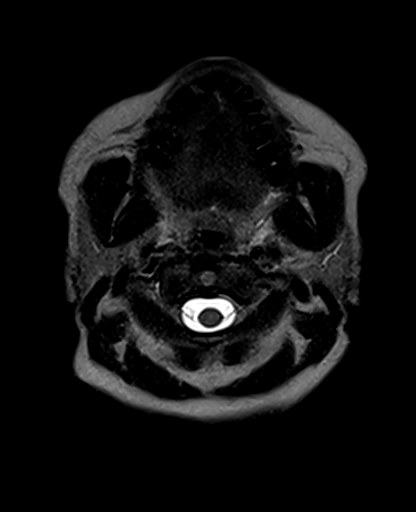
[im 2/24]
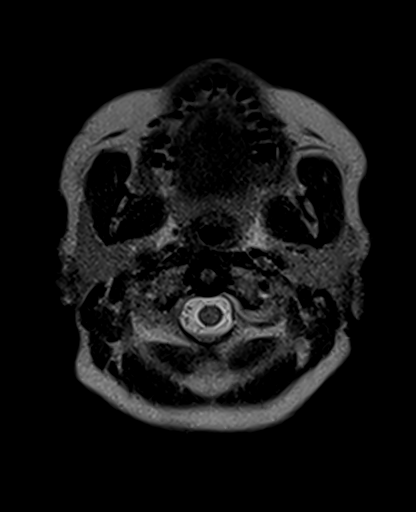
[im 3/24]
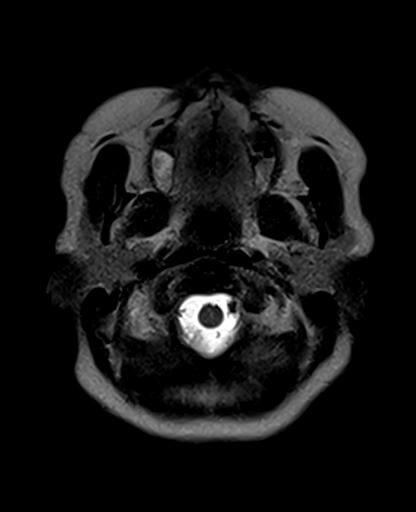
[im 4/24]
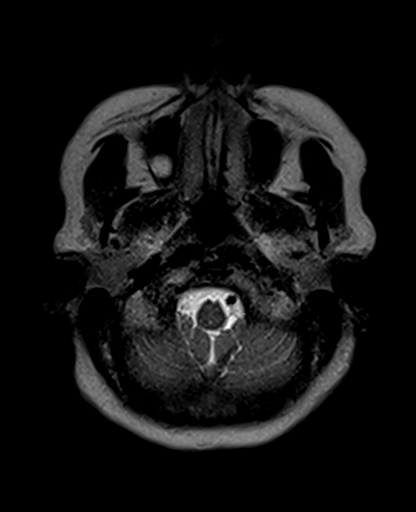
[im 5/24]
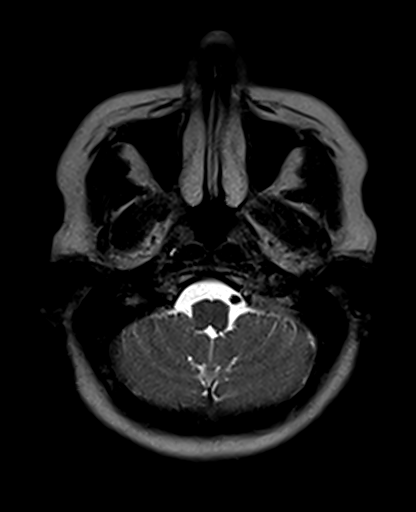
[im 6/24]
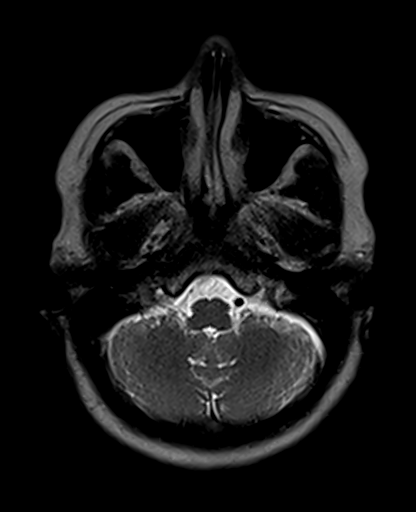
[im 7/24]
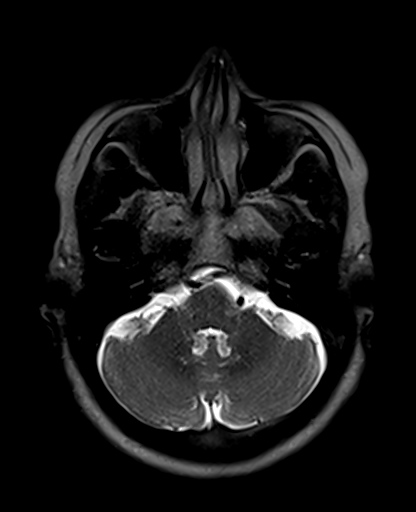
[im 8/24]
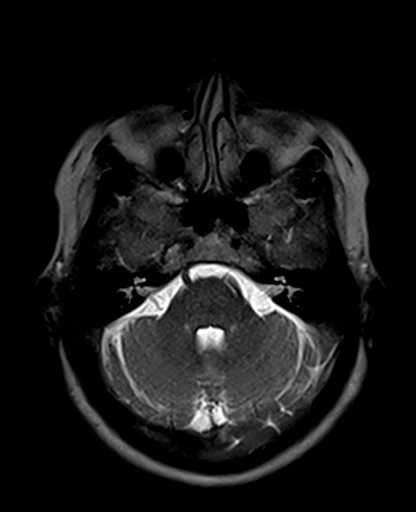
[im 9/24]
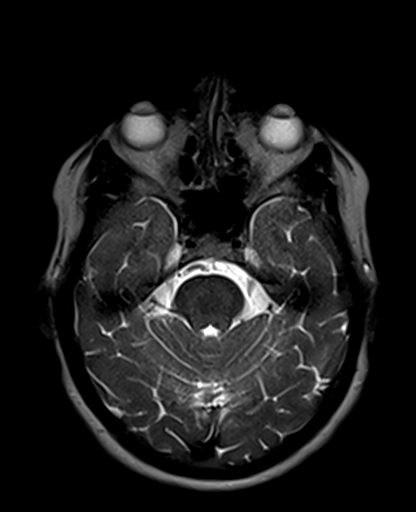
[im 10/24]
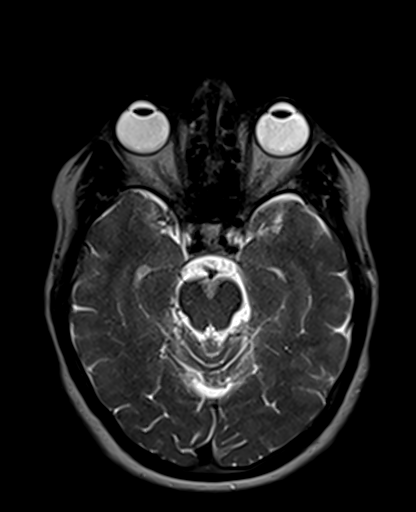
[im 11/24]
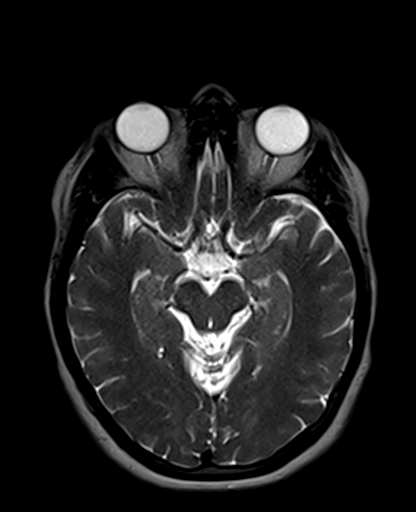
[im 12/24]
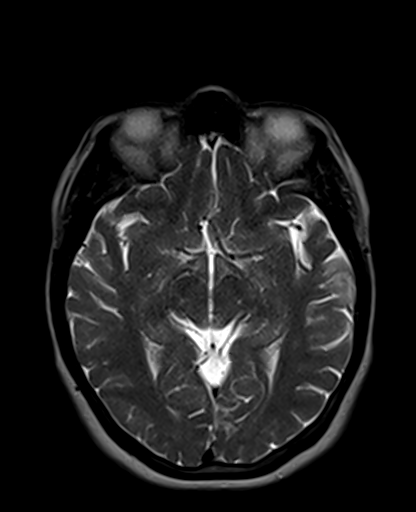
[im 13/24]
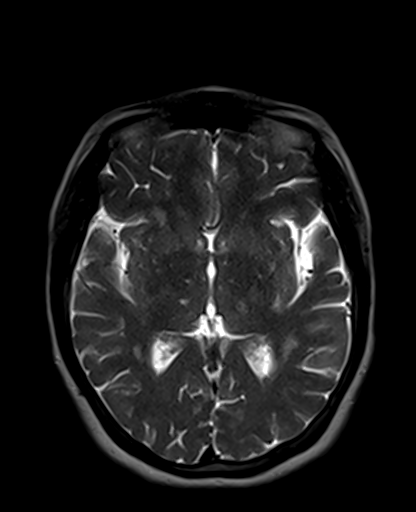
[im 14/24]
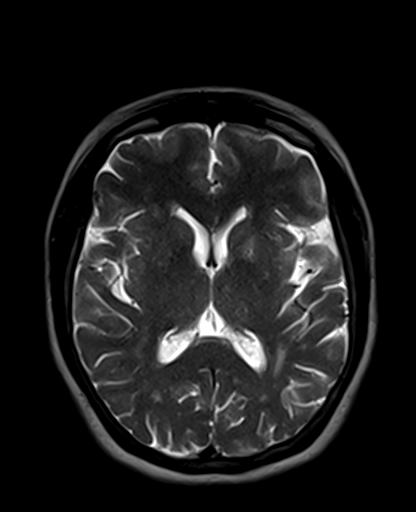
[im 15/24]
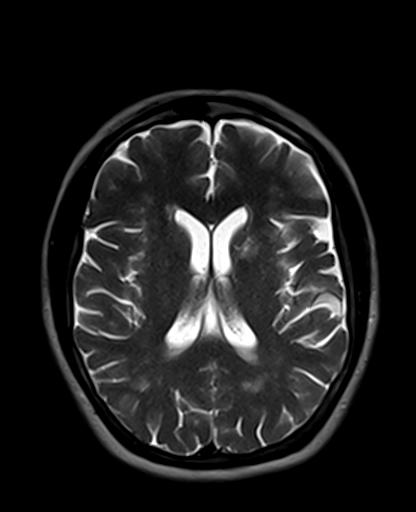
[im 16/24]
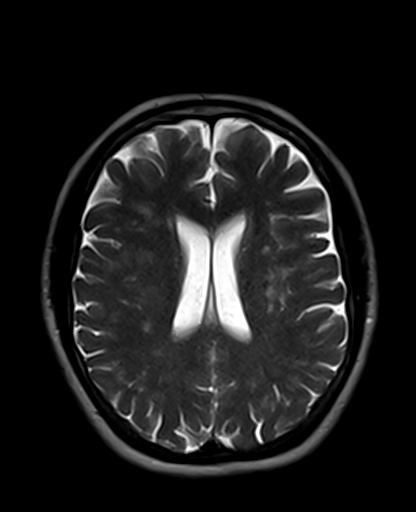
[im 17/24]
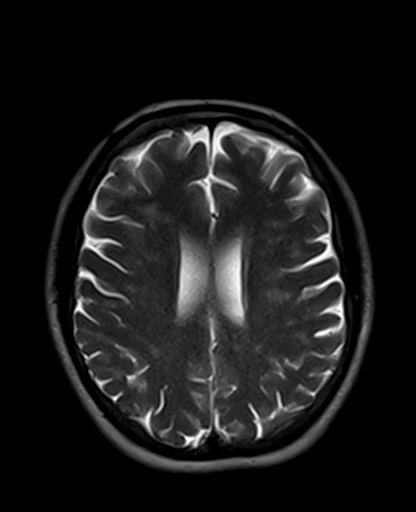
[im 18/24]
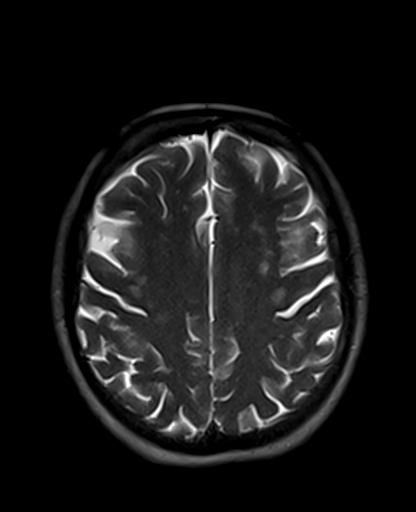
[im 19/24]
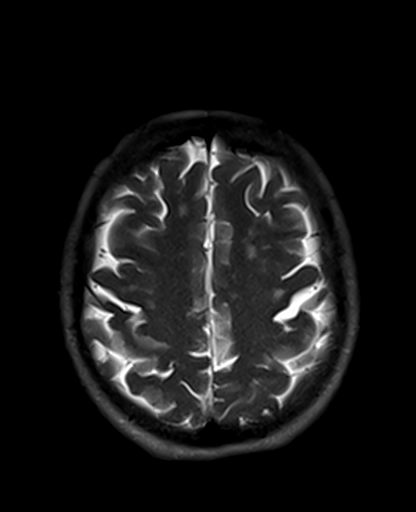
[im 20/24]
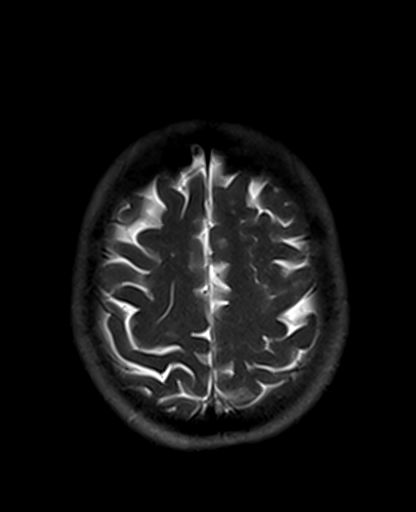
[im 21/24]
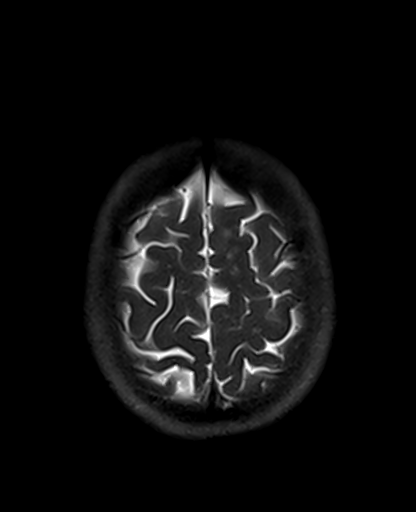
[im 23/24]
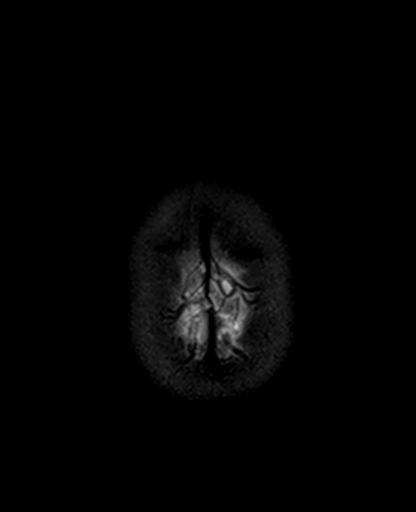

[22 of 24 positions shown; findings below may reference images not displayed]

FINDINGS: MR HEAD FINDINGS

Brain: There is no evidence of acute intracranial hemorrhage,
extra-axial fluid collection, or infarct.

Extensive foci of FLAIR signal abnormality are again seen throughout
the subcortical and periventricular white matter, similar to the
prior study. The ventricles are not enlarged. There is no mass
lesion. There is no midline shift.

Vascular: See below

Skull and upper cervical spine: Normal marrow signal.

Sinuses/Orbits: The paranasal sinuses are clear. The globes and
orbits are unremarkable.

Other: There is degenerative change of the temporomandibular joints,
right worse than left.

MRA HEAD FINDINGS

Anterior circulation: There is a 5-6 mm inferomedially directed
aneurysm arising from the cavernous segment of the right ICA (22-82,
see key images). The bilateral cavernous ICAs are patent, with no
significant stenosis or occlusion. The bilateral ACAs are patent.
The bilateral MCAs are patent. No other aneurysm is identified.

Posterior circulation: The right V4 segment is again not identified
on the time-of-flight images the left V4 segment is widely patent.
The basilar artery is patent. The bilateral PCAs are patent. No
aneurysm is identified.

MRA NECK FINDINGS

Aortic arch: The aortic arch is unremarkable.

Right carotid system: Patent, with no hemodynamically significant
stenosis, occlusion, dissection, or aneurysm.

Left carotid system: Patent, with no hemodynamically significant
stenosis, occlusion, dissection, or aneurysm.

Vertebral arteries: There is diminutive flow in the cervical portion
of the right vertebral artery. The left vertebral artery is patent.

Other: None.
IMPRESSION: 1. No acute intracranial pathology.
2. Unchanged extensive foci of FLAIR signal abnormality throughout
the supratentorial white matter, nonspecific but may reflect sequela
of chronic white matter microangiopathy, advanced for age.
3. Diminutive flow related enhancement throughout the cervical
portion of the right vertebral artery and non enhancement of the V4
segment (the V4 segment was also nonenhancing on the prior study).
This may reflect anatomic variant, chronic dissection, or multifocal
atherosclerotic disease. CTA neck would be helpful to evaluate for
atherosclerotic plaque.
4. Otherwise patent vasculature of the head and neck with no other
significant stenosis or occlusion.
5. 5-6 mm aneurysm of the right cavernous ICA; neurosurgical
consultation is recommended for management.

## 2021-01-23 IMAGING — MR MR MRA HEAD W/O CM
11 of 14 series · 29 of 48 positions shown · non-contrast
Comparison: MR Head [DATE]

EXAM:
MRI HEAD WITHOUT CONTRAST

MRA HEAD WITHOUT CONTRAST
MRA OF THE NECK WITHOUT CONTRAST
TECHNIQUE: Multiplanar, multi-echo pulse sequences of the brain and surrounding
structures were acquired without intravenous contrast. Angiographic
images of the Circle of Willis were acquired using MRA technique
without intravenous contrast. Angiographic images of the neck were
acquired using MRA technique without intravenous contrast. Carotid
stenosis measurements (when applicable) are obtained utilizing
NASCET criteria, using the distal internal carotid diameter as the
denominator.

[Series 5: dwi_tracew · axial · 3.0mm · 1.08mm/px · z∈[-68,+73]mm · 5 of 102 slices shown]
[im 1/102]
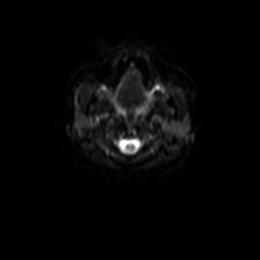
[im 26/102]
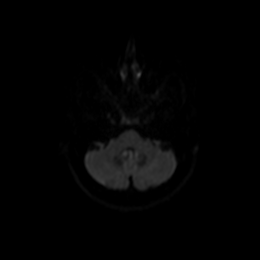
[im 51/102]
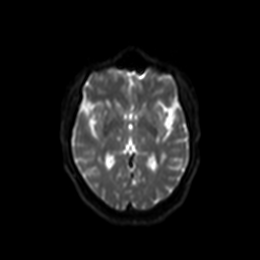
[im 76/102]
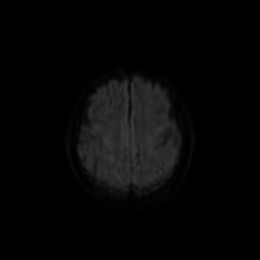
[im 102/102]
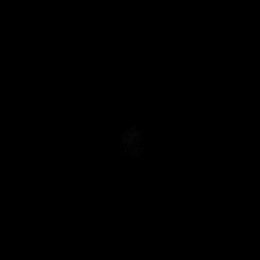

[Series 6: dwi_adc · axial · 3.0mm · 1.08mm/px · z∈[-68,+73]mm · 3 of 51 slices shown]
[im 1/51]
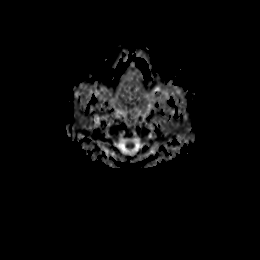
[im 26/51]
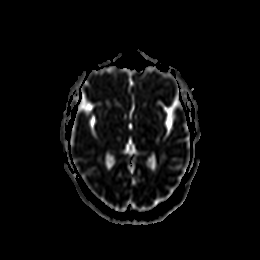
[im 51/51]
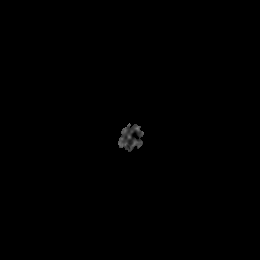

[Series 7: T2 · sagittal · 5.0mm · 0.47mm/px · 1 of 24 slices shown (1 of 3)]
[im 1/24]
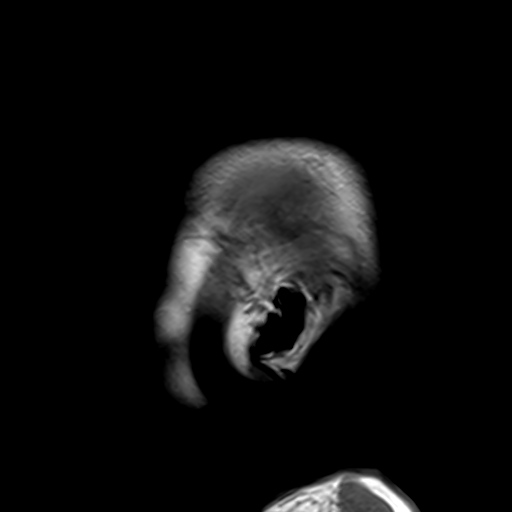

[Series 8: T2 · axial · 5.0mm · 0.45mm/px · 1 of 24 slices shown (2 of 3)]
[im 1/24]
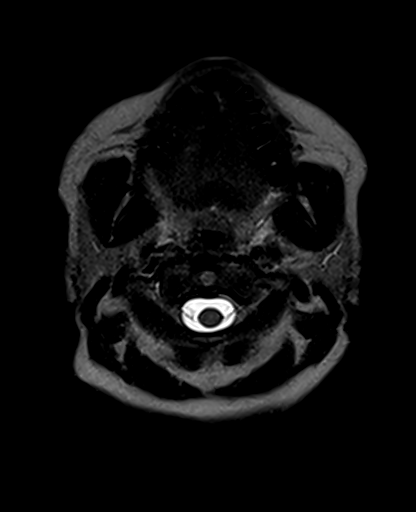

[Series 9: FLAIR · axial · 3.0mm · 0.86mm/px · z∈[-64,+75]mm · 3 of 51 slices shown]
[im 1/51]
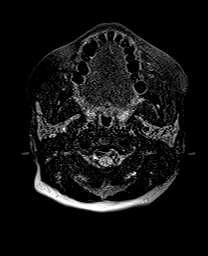
[im 26/51]
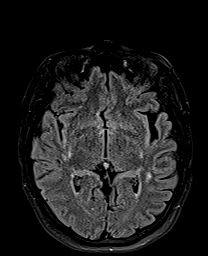
[im 51/51]
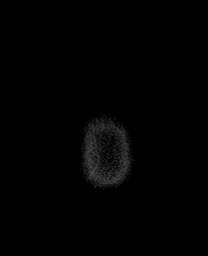

[Series 10: GRE · axial · 3.0mm · 0.45mm/px · z∈[-58,+83]mm · 3 of 51 slices shown]
[im 1/51]
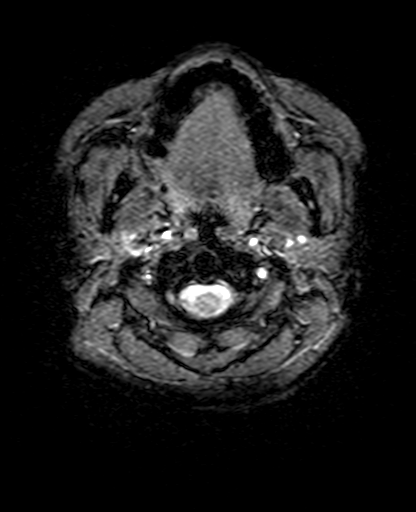
[im 26/51]
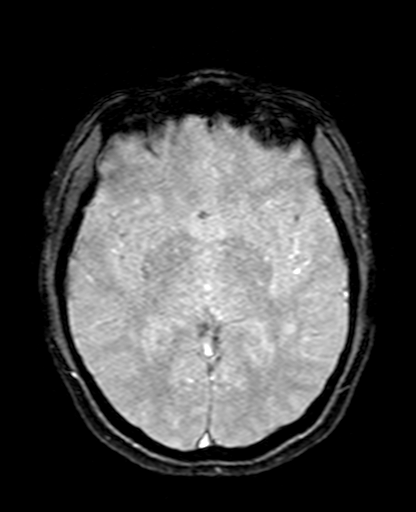
[im 51/51]
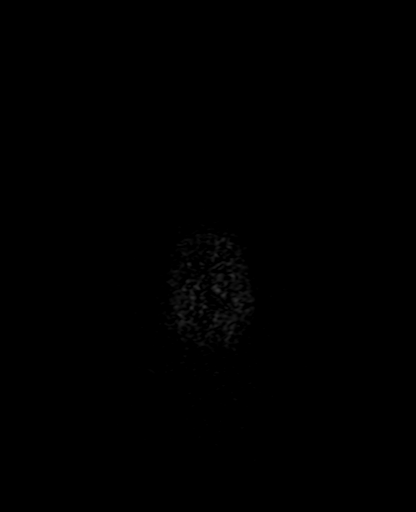

[Series 11: T1 · axial · 3.0mm · 0.45mm/px · z∈[-58,+83]mm · 3 of 51 slices shown]
[im 1/51]
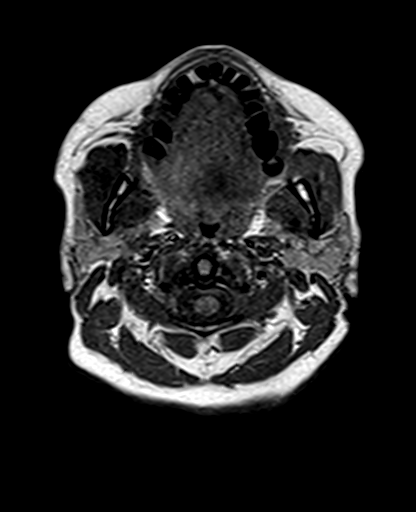
[im 26/51]
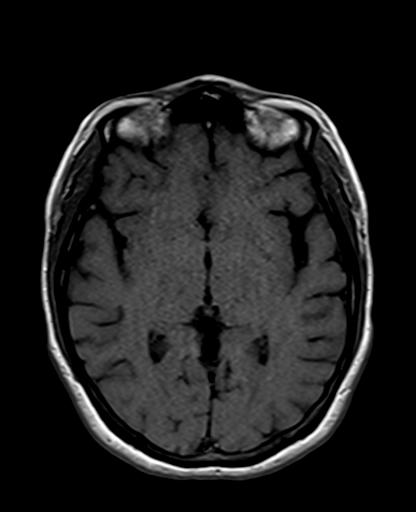
[im 51/51]
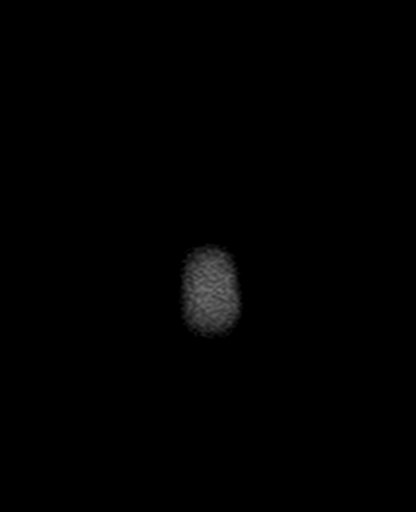

[Series 12: DWI · coronal · 5.0mm · 1.31mm/px · 3 of 52 slices shown]
[im 1/52]
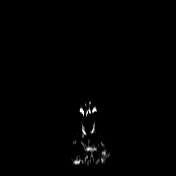
[im 26/52]
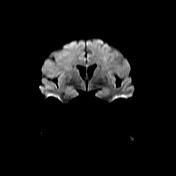
[im 52/52]
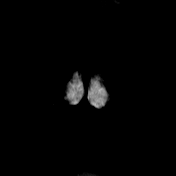

[Series 14: T2 · coronal · 5.0mm · 0.86mm/px · 2 of 26 slices shown (3 of 3)]
[im 1/26]
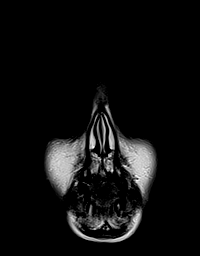
[im 26/26]
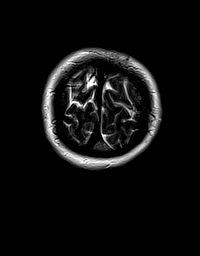

[Series 28: tof_2d_tra · axial · 3.5mm · 0.43mm/px · z∈[-173,-40]mm · 4 of 60 slices shown]
[im 1/60]
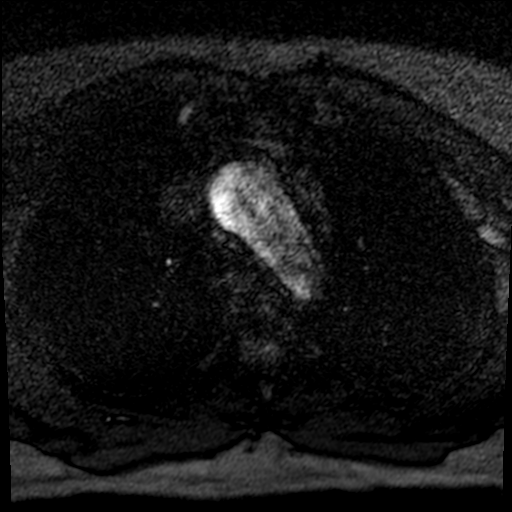
[im 20/60]
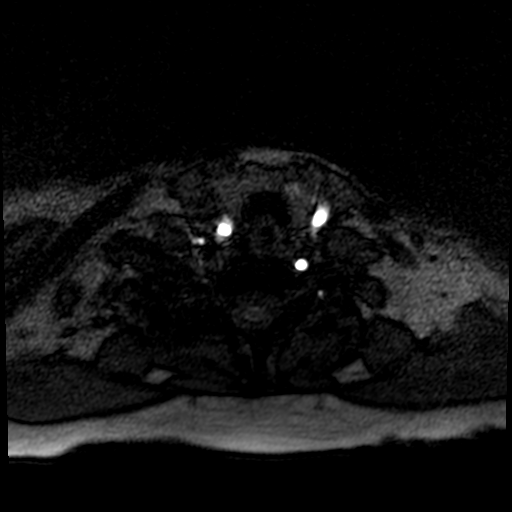
[im 40/60]
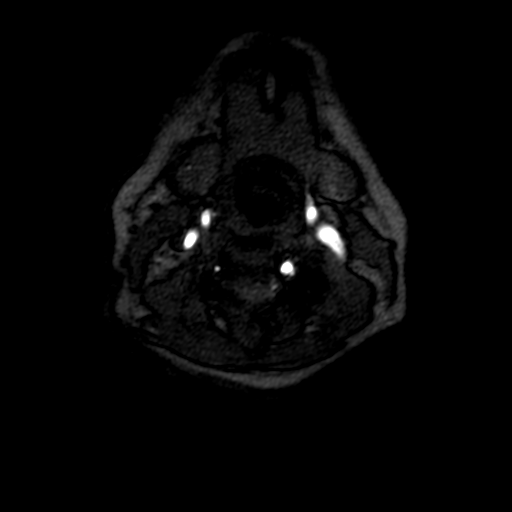
[im 60/60]
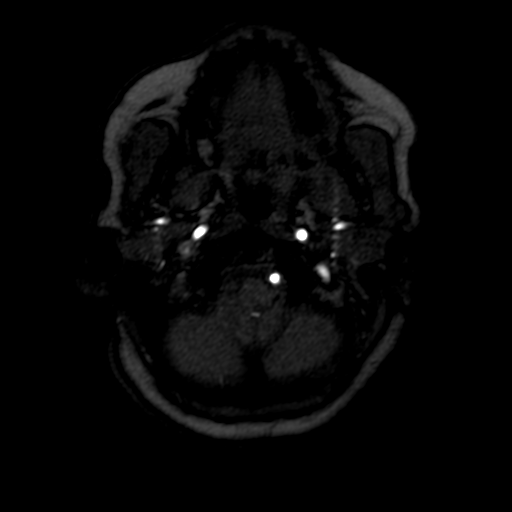

[Series 33: tof_3d_multi-slab-bifurcation · axial · 1.0mm · 0.26mm/px · 1 of 136 slices shown]
[im 1/136]
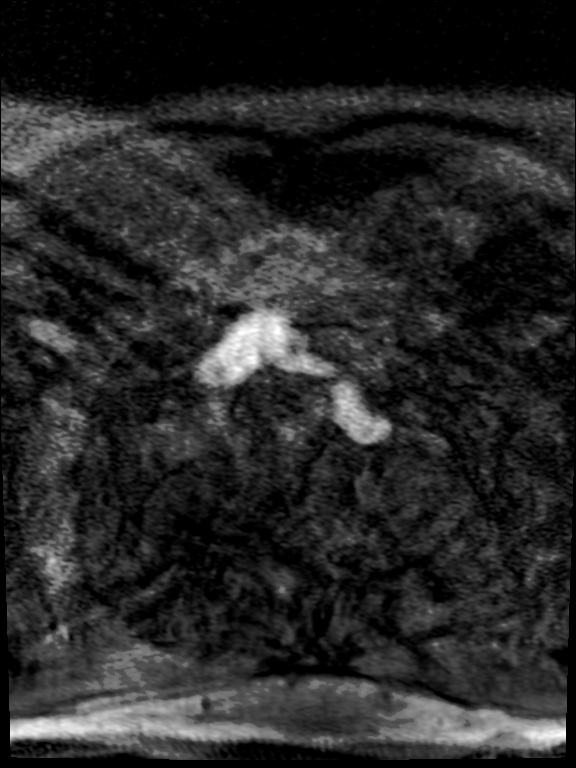

[29 of 48 positions shown; findings below may reference images not displayed]

FINDINGS: MR HEAD FINDINGS

Brain: There is no evidence of acute intracranial hemorrhage,
extra-axial fluid collection, or infarct.

Extensive foci of FLAIR signal abnormality are again seen throughout
the subcortical and periventricular white matter, similar to the
prior study. The ventricles are not enlarged. There is no mass
lesion. There is no midline shift.

Vascular: See below

Skull and upper cervical spine: Normal marrow signal.

Sinuses/Orbits: The paranasal sinuses are clear. The globes and
orbits are unremarkable.

Other: There is degenerative change of the temporomandibular joints,
right worse than left.

MRA HEAD FINDINGS

Anterior circulation: There is a 5-6 mm inferomedially directed
aneurysm arising from the cavernous segment of the right ICA (22-82,
see key images). The bilateral cavernous ICAs are patent, with no
significant stenosis or occlusion. The bilateral ACAs are patent.
The bilateral MCAs are patent. No other aneurysm is identified.

Posterior circulation: The right V4 segment is again not identified
on the time-of-flight images the left V4 segment is widely patent.
The basilar artery is patent. The bilateral PCAs are patent. No
aneurysm is identified.

MRA NECK FINDINGS

Aortic arch: The aortic arch is unremarkable.

Right carotid system: Patent, with no hemodynamically significant
stenosis, occlusion, dissection, or aneurysm.

Left carotid system: Patent, with no hemodynamically significant
stenosis, occlusion, dissection, or aneurysm.

Vertebral arteries: There is diminutive flow in the cervical portion
of the right vertebral artery. The left vertebral artery is patent.

Other: None.
IMPRESSION: 1. No acute intracranial pathology.
2. Unchanged extensive foci of FLAIR signal abnormality throughout
the supratentorial white matter, nonspecific but may reflect sequela
of chronic white matter microangiopathy, advanced for age.
3. Diminutive flow related enhancement throughout the cervical
portion of the right vertebral artery and non enhancement of the V4
segment (the V4 segment was also nonenhancing on the prior study).
This may reflect anatomic variant, chronic dissection, or multifocal
atherosclerotic disease. CTA neck would be helpful to evaluate for
atherosclerotic plaque.
4. Otherwise patent vasculature of the head and neck with no other
significant stenosis or occlusion.
5. 5-6 mm aneurysm of the right cavernous ICA; neurosurgical
consultation is recommended for management.

## 2021-01-23 MED ORDER — MECLIZINE HCL 25 MG PO TABS
25.0000 mg | ORAL_TABLET | Freq: Once | ORAL | Status: DC
  Filled 2021-01-23: qty 1

## 2021-01-23 MED ORDER — MECLIZINE HCL 25 MG PO TABS: 25.0000 mg | ORAL_TABLET | Freq: Three times a day (TID) | ORAL | 0 refills | Status: DC | PRN

## 2021-01-23 NOTE — Discharge Instructions (Addendum)
Take 1 baby aspirin a day.  Take the Antivert for dizziness and follow-up with either your family doctor or your neurologist for the dizziness.  You should be contacted by the neuro interventional radiologist about setting up an appointment to look closer at your aneurysm.  Return if any problem

## 2021-01-23 NOTE — ED Notes (Signed)
Patient transported to MRI 

## 2021-01-23 NOTE — Progress Notes (Signed)
Virtual Visit via Video Note  I connected with Kimberly Conway on 01/23/21 at 10:00 AM EDT by a video enabled telemedicine application and verified that I am speaking with the correct person using two identifiers.  Location patient: home Location provider: work office Persons participating in the virtual visit: patient, provider  I discussed the limitations of evaluation and management by telemedicine and the availability of in person appointments. The patient expressed understanding and agreed to proceed.   HPI: She is scheduled this visit to discuss dizziness and gait imbalance.  She states that since last Saturday she has been feeling dizziness above her usual.  She has been feeling dizzy now for months but now she has severe gait imbalance, has to hold onto walls, it has not improved over the course of the last 5 days.  In addition she has a headache that she describes as a "heavy sensation" to the right side of her head.  She has done 2 COVID tests which have been negative at home.  Review of her charts denotes that she saw neurology, Dr. Tomi Likens, back in May.  He ordered an MRI/MRA of the head.  There appeared to be no antegrade flow of the right vertebral artery.  Not clear if there was any follow-up.   ROS: Constitutional: Denies fever, chills, diaphoresis, appetite change and fatigue.  HEENT: Denies photophobia, eye pain, redness,  mouth sores, trouble swallowing, neck pain, neck stiffness and tinnitus.   Respiratory: Denies SOB, DOE, cough, chest tightness,  and wheezing.   Cardiovascular: Denies chest pain, palpitations and leg swelling.  Gastrointestinal: Denies nausea, vomiting, abdominal pain, diarrhea, constipation, blood in stool and abdominal distention.  Genitourinary: Denies dysuria, urgency, frequency, hematuria, flank pain and difficulty urinating.  Endocrine: Denies: hot or cold intolerance, sweats, changes in hair or nails, polyuria, polydipsia. Musculoskeletal: Denies  myalgias, back pain, joint swelling, arthralgia. Skin: Denies pallor, rash and wound.  Neurological: Denies seizures, syncope. Hematological: Denies adenopathy. Easy bruising, personal or family bleeding history  Psychiatric/Behavioral: Denies suicidal ideation, mood changes, confusion, nervousness, sleep disturbance and agitation   Past Medical History:  Diagnosis Date   Depression    GAD (generalized anxiety disorder)    GERD (gastroesophageal reflux disease)    HTN (hypertension)    Multiple thyroid nodules    Other atopic dermatitis 08/17/2020   Seasonal allergies    Urticaria     Past Surgical History:  Procedure Laterality Date   APPENDECTOMY      Family History  Problem Relation Age of Onset   Diabetes Mother    Breast cancer Mother    CAD Father    Hypertension Sister    Breast cancer Sister    Migraines Sister    Diabetes Brother    Migraines Sister    Colon cancer Neg Hx    Esophageal cancer Neg Hx    Pancreatic cancer Neg Hx    Stomach cancer Neg Hx     SOCIAL HX:   reports that she quit smoking about 6 years ago. Her smoking use included cigarettes. She has never used smokeless tobacco. She reports that she does not currently use alcohol. She reports that she does not use drugs.   Current Outpatient Medications:    b complex vitamins tablet, Take 1 tablet by mouth daily., Disp: , Rfl:    Calcium 500-125 MG-UNIT TABS, Take by mouth., Disp: , Rfl:    Calcium Citrate (CITRACAL PO), Take 1,200 mg by mouth 3 (three) times daily. ,  Disp: , Rfl:    desvenlafaxine (PRISTIQ) 50 MG 24 hr tablet, Take 1 tablet (50 mg total) by mouth daily., Disp: 90 tablet, Rfl: 2   fluticasone (FLONASE) 50 MCG/ACT nasal spray, USE 2 SPRAYS IN EACH NOSTRIL EVERY DAY, Disp: 48 g, Rfl: 0   glucosamine-chondroitin 500-400 MG tablet, Take 1 tablet by mouth daily., Disp: , Rfl:    levocetirizine (XYZAL) 5 MG tablet, TAKE 1 TABLET (5 MG TOTAL) BY MOUTH EVERY EVENING., Disp: 90 tablet,  Rfl: 1   LORazepam (ATIVAN) 0.5 MG tablet, Take 1 tablet (0.5 mg total) by mouth daily as needed., Disp: 30 tablet, Rfl: 2   metoprolol succinate (TOPROL-XL) 25 MG 24 hr tablet, Take 25 mg by mouth. Take half tablet daily, Disp: , Rfl:    Multiple Vitamin (MULTIVITAMIN) tablet, Take 1 tablet by mouth daily., Disp: , Rfl:    omega-3 fish oil (MAXEPA) 1000 MG CAPS capsule, Take 1 capsule by mouth daily. , Disp: , Rfl:    omeprazole (PRILOSEC) 20 MG capsule, Take 1 capsule (20 mg total) by mouth 2 (two) times daily before a meal., Disp: 180 capsule, Rfl: 3   OVER THE COUNTER MEDICATION, Glucosamine 1500 mg  Chon 1200 mg, Disp: , Rfl:    Probiotic Product (PROBIOTIC-10 PO), Take by mouth., Disp: , Rfl:   EXAM:   VITALS per patient if applicable: She reports a blood pressure 123/74 and a temperature of 97.0.  GENERAL: alert, oriented, is holding onto the right side of her head and ear throughout our conversation.  Her left hand is firmly planted on a table as if to hold her balance.  HEENT: atraumatic, conjunttiva clear, no obvious abnormalities on inspection of external nose and ears  NECK: normal movements of the head and neck  LUNGS: on inspection no signs of respiratory distress, breathing rate appears normal, no obvious gross increased work of breathing, gasping or wheezing  CV: no obvious cyanosis  MS: moves all visible extremities without noticeable abnormality  PSYCH/NEURO: pleasant and cooperative, no obvious depression or anxiety, speech and thought processing grossly intact  ASSESSMENT AND PLAN:   Dizziness Ataxia Acute nonintractable headache, unspecified headache type  -I am concerned that she might have had a posterior circulation CVA given her presentation and her right vertebral artery occlusion on prior MRA. -I have advised ED evaluation today.  If ED evaluation is normal, I have advised that she return to Dr. Tomi Likens for evaluation.  Time spent: 31 minutes reviewing  chart, interviewing and assessing patient, formulating plan of care.     I discussed the assessment and treatment plan with the patient. The patient was provided an opportunity to ask questions and all were answered. The patient agreed with the plan and demonstrated an understanding of the instructions.   The patient was advised to call back or seek an in-person evaluation if the symptoms worsen or if the condition fails to improve as anticipated.    Lelon Frohlich, MD  [facility]

## 2021-01-23 NOTE — ED Triage Notes (Signed)
Patient reports that she has had dizziness and head feeling heavy x 5 days. Patient states a history of vertigo and states she had a test a month ago that showed narrowing of a vessel in her neck. No weakness of extremities.

## 2021-01-23 NOTE — Telephone Encounter (Signed)
Ms. Kimberly Conway returned my call concerning scheduling a consultation with Dr. Debbrah Alar for her dizziness and her newly diagnosed brain aneurysm. The pt and her sister had many questions and concerns about this new diagnosis. We talked at length. Pt is scheduled to see Dr. Lucianne Lei tomorrow at 12:30 pm. Velora Mediate

## 2021-01-23 NOTE — Telephone Encounter (Signed)
Called pt to set up consultation with Neuro IR doctor, Erven Colla de [place]. Patient was referred to Dr. Horace Porteous for dizziness and brain aneurysm. Left message for her to call back to schedule consult. JM

## 2021-01-23 NOTE — ED Provider Notes (Signed)
[facility] DEPT Provider Note   CSN: [id number] Arrival date & time: 01/23/21  1151     History Chief Complaint  Patient presents with   Dizziness   head feels heavy    Kimberly Conway is a 65 y.o. female.  Patient complains of some dizziness.  She has had problems with vertigo before  The history is provided by the patient and medical records. No language interpreter was used.  Dizziness Quality:  Head spinning Severity:  Mild Timing:  Intermittent Progression:  Waxing and waning Chronicity:  Recurrent Context: head movement   Relieved by:  Nothing Worsened by:  Nothing Ineffective treatments:  None tried Associated symptoms: no blood in stool, no chest pain, no diarrhea and no headaches       Past Medical History:  Diagnosis Date   Depression    GAD (generalized anxiety disorder)    GERD (gastroesophageal reflux disease)    HTN (hypertension)    Multiple thyroid nodules    Other atopic dermatitis 08/17/2020   Seasonal allergies    Urticaria    Vertigo     Patient Active Problem List   Diagnosis Date Noted   Allergic contact dermatitis 08/17/2020   Other atopic dermatitis 08/17/2020   Other allergic rhinitis 08/17/2020   Depression    HTN (hypertension)    Multiple thyroid nodules    Anxiety and depression 01/02/2019   Gastroesophageal reflux disease without esophagitis 01/02/2019   Seasonal allergies 01/02/2019    Past Surgical History:  Procedure Laterality Date   APPENDECTOMY       OB History   No obstetric history on file.     Family History  Problem Relation Age of Onset   Diabetes Mother    Breast cancer Mother    CAD Father    Hypertension Sister    Breast cancer Sister    Migraines Sister    Migraines Sister    Diabetes Brother    Colon cancer Neg Hx    Esophageal cancer Neg Hx    Pancreatic cancer Neg Hx    Stomach cancer Neg Hx     Social History   Tobacco Use   Smoking status: Former     Types: Cigarettes    Quit date: 06/21/2014    Years since quitting: 6.5   Smokeless tobacco: Never  Vaping Use   Vaping Use: Never used  Substance Use Topics   Alcohol use: Not Currently   Drug use: Never    Home Medications Prior to Admission medications   Medication Sig Start Date End Date Taking? Authorizing Provider  meclizine (ANTIVERT) 25 MG tablet Take 1 tablet (25 mg total) by mouth 3 (three) times daily as needed for dizziness. 01/23/21  Yes Milton Ferguson, MD  b complex vitamins tablet Take 1 tablet by mouth daily.    [provider]  Calcium 500-125 MG-UNIT TABS Take by mouth.    [provider]  Calcium Citrate (CITRACAL PO) Take 1,200 mg by mouth 3 (three) times daily.     [provider]  desvenlafaxine (PRISTIQ) 50 MG 24 hr tablet Take 1 tablet (50 mg total) by mouth daily. 12/27/20   Mozingo, Berdie Ogren, NP  fluticasone [employer]) 50 MCG/ACT nasal spray USE 2 SPRAYS IN [facility] NOSTRIL EVERY DAY 12/11/20   Isaac Bliss, Rayford Halsted, MD  glucosamine-chondroitin 500-400 MG tablet Take 1 tablet by mouth daily.    [provider]  levocetirizine (XYZAL) 5 MG tablet TAKE 1 TABLET (5  MG TOTAL) BY MOUTH EVERY EVENING. 10/04/20   Isaac Bliss, Rayford Halsted, MD  LORazepam (ATIVAN) 0.5 MG tablet Take 1 tablet (0.5 mg total) by mouth daily as needed. 02/05/20   Mozingo, Berdie Ogren, NP  metoprolol succinate (TOPROL-XL) 25 MG 24 hr tablet Take 25 mg by mouth. Take half tablet daily 01/10/18   [provider]  Multiple Vitamin (MULTIVITAMIN) tablet Take 1 tablet by mouth daily.    [provider]  omega-3 fish oil (MAXEPA) 1000 MG CAPS capsule Take 1 capsule by mouth daily.     [provider]  omeprazole (PRILOSEC) 20 MG capsule Take 1 capsule (20 mg total) by mouth 2 (two) times daily before a meal. 10/08/20   Isaac Bliss, Rayford Halsted, MD  OVER THE COUNTER MEDICATION Glucosamine 1500 mg  Chon 1200 mg    [provider]  Probiotic Product (PROBIOTIC-10 PO) Take by mouth.    [provider]    Allergies    Patient has no known allergies.  Review of Systems   Review of Systems  Constitutional:  Negative for appetite change and fatigue.  HENT:  Negative for congestion, ear discharge and sinus pressure.   Eyes:  Negative for discharge.  Respiratory:  Negative for cough.   Cardiovascular:  Negative for chest pain.  Gastrointestinal:  Negative for abdominal pain, blood in stool and diarrhea.  Genitourinary:  Negative for frequency and hematuria.  Musculoskeletal:  Negative for back pain.  Skin:  Negative for rash.  Neurological:  Positive for dizziness. Negative for seizures and headaches.  Psychiatric/Behavioral:  Negative for hallucinations.    Physical Exam Updated Vital Signs BP 132/80   Pulse (!) 57   Temp 97.8 F (36.6 C) (Oral)   Resp 18   Ht '5\' 2"'$  (1.575 m)   Wt 68 kg   SpO2 100%   BMI 27.44 kg/m   Physical Exam Vitals and nursing note reviewed.  Constitutional:      Appearance: She is well-developed.  HENT:     Head: Normocephalic.     Nose: Nose normal.  Eyes:     General: No scleral icterus.    Conjunctiva/sclera: Conjunctivae normal.  Neck:     Thyroid: No thyromegaly.  Cardiovascular:     Rate and Rhythm: Normal rate and regular rhythm.     Heart sounds: No murmur heard.   No friction rub. No gallop.  Pulmonary:     Breath sounds: No stridor. No wheezing or rales.  Chest:     Chest wall: No tenderness.  Abdominal:     General: There is no distension.     Tenderness: There is no abdominal tenderness. There is no rebound.  Musculoskeletal:        General: Normal range of motion.     Cervical back: Neck supple.  Lymphadenopathy:     Cervical: No cervical adenopathy.  Skin:    Findings: No erythema or rash.  Neurological:     Mental Status: She is alert and oriented to person, place, and time.     Motor: No abnormal muscle tone.     Coordination:  Coordination normal.  Psychiatric:        Behavior: Behavior normal.    ED Results / Procedures / Treatments   Labs (all labs ordered are listed, but only abnormal results are displayed) Labs Reviewed  CBC WITH DIFFERENTIAL/PLATELET  COMPREHENSIVE METABOLIC PANEL    EKG None  Radiology MR ANGIO HEAD WO CONTRAST  Result Date: 01/23/2021  EXAM: MRI HEAD WITHOUT CONTRAST MRA HEAD WITHOUT CONTRAST MRA OF THE NECK WITHOUT CONTRAST TECHNIQUE: Multiplanar, multi-echo pulse sequences of the brain and surrounding structures were acquired without intravenous contrast. Angiographic images of the Circle of Willis were acquired using MRA technique without intravenous contrast. Angiographic images of the neck were acquired using MRA technique without intravenous contrast. Carotid stenosis measurements (when applicable) are obtained utilizing NASCET criteria, using the distal internal carotid diameter as the denominator. COMPARISON:  MR Head 10/12/2020 FINDINGS: MR HEAD FINDINGS Brain: There is no evidence of acute intracranial hemorrhage, extra-axial fluid collection, or infarct. Extensive foci of FLAIR signal abnormality are again seen throughout the subcortical and periventricular white matter, similar to the prior study. The ventricles are not enlarged. There is no mass lesion. There is no midline shift. Vascular: See below Skull and upper cervical spine: Normal marrow signal. Sinuses/Orbits: The paranasal sinuses are clear. The globes and orbits are unremarkable. Other: There is degenerative change of the temporomandibular joints, right worse than left. MRA HEAD FINDINGS Anterior circulation: There is a 5-6 mm inferomedially directed aneurysm arising from the cavernous segment of the right ICA (22-82, see key images). The bilateral cavernous ICAs are patent, with no significant stenosis or occlusion. The bilateral ACAs are patent. The bilateral MCAs are patent. No other aneurysm is identified. Posterior  circulation: The right V4 segment is again not identified on the time-of-flight images the left V4 segment is widely patent. The basilar artery is patent. The bilateral PCAs are patent. No aneurysm is identified. MRA NECK FINDINGS Aortic arch: The aortic arch is unremarkable. Right carotid system: Patent, with no hemodynamically significant stenosis, occlusion, dissection, or aneurysm. Left carotid system: Patent, with no hemodynamically significant stenosis, occlusion, dissection, or aneurysm. Vertebral arteries: There is diminutive flow in the cervical portion of the right vertebral artery. The left vertebral artery is patent. Other: None. IMPRESSION: 1. No acute intracranial pathology. 2. Unchanged extensive foci of FLAIR signal abnormality throughout the supratentorial white matter, nonspecific but may reflect sequela of chronic white matter microangiopathy, advanced for age. 3. Diminutive flow related enhancement throughout the cervical portion of the right vertebral artery and non enhancement of the V4 segment (the V4 segment was also nonenhancing on the prior study). This may reflect anatomic variant, chronic dissection, or multifocal atherosclerotic disease. CTA neck would be helpful to evaluate for atherosclerotic plaque. 4. Otherwise patent vasculature of the head and neck with no other significant stenosis or occlusion. 5. 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 Electronically Signed   By: Valetta Mole M.D.   On: 01/23/2021 15:19   MR ANGIO NECK WO CONTRAST  Result Date: 01/23/2021 EXAM: MRI HEAD WITHOUT CONTRAST MRA HEAD WITHOUT CONTRAST MRA OF THE NECK WITHOUT CONTRAST TECHNIQUE: Multiplanar, multi-echo pulse sequences of the brain and surrounding structures were acquired without intravenous contrast. Angiographic images of the Circle of Willis were acquired using MRA technique without intravenous contrast. Angiographic images of the neck  were acquired using MRA technique without intravenous contrast. Carotid stenosis measurements (when applicable) are obtained utilizing NASCET criteria, using the distal internal carotid diameter as the denominator. COMPARISON:  MR Head 10/12/2020 FINDINGS: MR HEAD FINDINGS Brain: There is no evidence of acute intracranial hemorrhage, extra-axial fluid collection, or infarct. Extensive foci of FLAIR signal abnormality are again seen throughout the subcortical and periventricular white matter, similar to the prior study. The ventricles are not enlarged. There is no mass lesion. There is no midline shift. Vascular:  See below Skull and upper cervical spine: Normal marrow signal. Sinuses/Orbits: The paranasal sinuses are clear. The globes and orbits are unremarkable. Other: There is degenerative change of the temporomandibular joints, right worse than left. MRA HEAD FINDINGS Anterior circulation: There is a 5-6 mm inferomedially directed aneurysm arising from the cavernous segment of the right ICA (22-82, see key images). The bilateral cavernous ICAs are patent, with no significant stenosis or occlusion. The bilateral ACAs are patent. The bilateral MCAs are patent. No other aneurysm is identified. Posterior circulation: The right V4 segment is again not identified on the time-of-flight images the left V4 segment is widely patent. The basilar artery is patent. The bilateral PCAs are patent. No aneurysm is identified. MRA NECK FINDINGS Aortic arch: The aortic arch is unremarkable. Right carotid system: Patent, with no hemodynamically significant stenosis, occlusion, dissection, or aneurysm. Left carotid system: Patent, with no hemodynamically significant stenosis, occlusion, dissection, or aneurysm. Vertebral arteries: There is diminutive flow in the cervical portion of the right vertebral artery. The left vertebral artery is patent. Other: None. IMPRESSION: 1. No acute intracranial pathology. 2. Unchanged extensive foci  of FLAIR signal abnormality throughout the supratentorial white matter, nonspecific but may reflect sequela of chronic white matter microangiopathy, advanced for age. 3. Diminutive flow related enhancement throughout the cervical portion of the right vertebral artery and non enhancement of the V4 segment (the V4 segment was also nonenhancing on the prior study). This may reflect anatomic variant, chronic dissection, or multifocal atherosclerotic disease. CTA neck would be helpful to evaluate for atherosclerotic plaque. 4. Otherwise patent vasculature of the head and neck with no other significant stenosis or occlusion. 5. 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 Electronically Signed   By: Valetta Mole M.D.   On: 01/23/2021 15:19   MR BRAIN WO CONTRAST  Result Date: 01/23/2021 EXAM: MRI HEAD WITHOUT CONTRAST MRA HEAD WITHOUT CONTRAST MRA OF THE NECK WITHOUT CONTRAST TECHNIQUE: Multiplanar, multi-echo pulse sequences of the brain and surrounding structures were acquired without intravenous contrast. Angiographic images of the Circle of Willis were acquired using MRA technique without intravenous contrast. Angiographic images of the neck were acquired using MRA technique without intravenous contrast. Carotid stenosis measurements (when applicable) are obtained utilizing NASCET criteria, using the distal internal carotid diameter as the denominator. COMPARISON:  MR Head 10/12/2020 FINDINGS: MR HEAD FINDINGS Brain: There is no evidence of acute intracranial hemorrhage, extra-axial fluid collection, or infarct. Extensive foci of FLAIR signal abnormality are again seen throughout the subcortical and periventricular white matter, similar to the prior study. The ventricles are not enlarged. There is no mass lesion. There is no midline shift. Vascular: See below Skull and upper cervical spine: Normal marrow signal. Sinuses/Orbits: The paranasal sinuses are  clear. The globes and orbits are unremarkable. Other: There is degenerative change of the temporomandibular joints, right worse than left. MRA HEAD FINDINGS Anterior circulation: There is a 5-6 mm inferomedially directed aneurysm arising from the cavernous segment of the right ICA (22-82, see key images). The bilateral cavernous ICAs are patent, with no significant stenosis or occlusion. The bilateral ACAs are patent. The bilateral MCAs are patent. No other aneurysm is identified. Posterior circulation: The right V4 segment is again not identified on the time-of-flight images the left V4 segment is widely patent. The basilar artery is patent. The bilateral PCAs are patent. No aneurysm is identified. MRA NECK FINDINGS Aortic arch: The aortic arch is unremarkable. Right carotid system: Patent, with no  hemodynamically significant stenosis, occlusion, dissection, or aneurysm. Left carotid system: Patent, with no hemodynamically significant stenosis, occlusion, dissection, or aneurysm. Vertebral arteries: There is diminutive flow in the cervical portion of the right vertebral artery. The left vertebral artery is patent. Other: None. IMPRESSION: 1. No acute intracranial pathology. 2. Unchanged extensive foci of FLAIR signal abnormality throughout the supratentorial white matter, nonspecific but may reflect sequela of chronic white matter microangiopathy, advanced for age. 3. Diminutive flow related enhancement throughout the cervical portion of the right vertebral artery and non enhancement of the V4 segment (the V4 segment was also nonenhancing on the prior study). This may reflect anatomic variant, chronic dissection, or multifocal atherosclerotic disease. CTA neck would be helpful to evaluate for atherosclerotic plaque. 4. Otherwise patent vasculature of the head and neck with no other significant stenosis or occlusion. 5. 5-6 mm aneurysm of the right cavernous ICA; neurosurgical consultation is recommended for  management. Electronically Signed   By: Valetta Mole M.D.   On: 01/23/2021 15:19    Procedures Procedures   Medications Ordered in ED Medications  meclizine (ANTIVERT) tablet 25 mg (25 mg Oral Patient Refused/Not Given 01/23/21 1235)    ED Course  I have reviewed the triage vital signs and the nursing notes.  Pertinent labs & imaging results that were available during my care of the patient were reviewed by me and considered in my medical decision making (see chart for details).   CRITICAL CARE Performed by: Milton Ferguson Total critical care time: 4mnutes Critical care time was exclusive of separately billable procedures and treating other patients. Critical care was necessary to treat or prevent imminent or life-threatening deterioration. Critical care was time spent personally by me on the following activities: development of treatment plan with patient and/or surrogate as well as nursing, discussions with consultants, evaluation of patient's response to treatment, examination of patient, obtaining history from patient or surrogate, ordering and performing treatments and interventions, ordering and review of laboratory studies, ordering and review of radiographic studies, pulse oximetry and re-evaluation of patient's condition. Patient has diminutive flow throughout the cervical portion of the right vertebral artery.  This does not look new.  She also has a small 5 mm aneurysm of the right cavernous ICA.  I spoke to Dr. KKatherine Roanneurology and he stated to place the patient on 1 baby aspirin a day and then contact neuro interventional list.  I talked to the neuro interventionalists and they will contact the patient for outpatient follow-up care MDM Rules/Calculators/A&P                           Patient with vertigo she is getting Antivert.  Also patient with a small cerebral aneurysm and blockage of her vertebral artery.  She will follow-up with the neuro interventionalists for  that Final Clinical Impression(s) / ED Diagnoses Final diagnoses:  Dizziness    Rx / DC Orders ED Discharge Orders          Ordered    meclizine (ANTIVERT) 25 MG tablet  3 times daily PRN        01/23/21 1645             ZMilton Ferguson MD 01/27/21 1614

## 2021-01-23 NOTE — ED Notes (Signed)
An After Visit Summary was printed and given to the patient. Discharge instructions given and no further questions at this time.  Pt leaving with sister.

## 2021-01-24 ENCOUNTER — Encounter: Payer: Self-pay | Admitting: Internal Medicine

## 2021-01-24 ENCOUNTER — Ambulatory Visit (HOSPITAL_COMMUNITY)
Admission: RE | Admit: 2021-01-24 | Discharge: 2021-01-24 | Disposition: A | Discharge status: Home / Self Care | Payer: Medicare HMO | Source: Ambulatory Visit | Attending: Neuroradiology | Admitting: Neuroradiology

## 2021-01-24 ENCOUNTER — Ambulatory Visit (INDEPENDENT_AMBULATORY_CARE_PROVIDER_SITE_OTHER): Payer: Medicare HMO | Admitting: Internal Medicine

## 2021-01-24 VITALS — BP 110/80 | HR 59 | Temp 98.1°F | Wt 162.3 lb

## 2021-01-24 DIAGNOSIS — J309 Allergic rhinitis, unspecified: Secondary | ICD-10-CM

## 2021-01-24 DIAGNOSIS — R42 Dizziness and giddiness: Secondary | ICD-10-CM

## 2021-01-24 DIAGNOSIS — I6501 Occlusion and stenosis of right vertebral artery: Secondary | ICD-10-CM | POA: Diagnosis not present

## 2021-01-24 DIAGNOSIS — I720 Aneurysm of carotid artery: Secondary | ICD-10-CM | POA: Diagnosis not present

## 2021-01-24 DIAGNOSIS — I671 Cerebral aneurysm, nonruptured: Secondary | ICD-10-CM

## 2021-01-24 NOTE — Consult Note (Signed)
Chief Complaint: Patient was seen in consultation today for brain aneurysm and vertebral artery stenosis.  Referring Physician(s): Milton Ferguson  Supervising Physician: Pedro Earls  Patient Status: [facility] - Out-pt  History of Present Illness: Kimberly Conway is a 65 y.o. female with a past medical history significant for depression, hypertension and vertigo who presented to the emergency department yesterday due to a 5 day history of worsening dizziness, imbalance and fogginess.  She underwent an MRI/MRA of the head due to concerns for posterior circulation stroke.  The study was negative for stroke, however, MR angiogram revealed a 6 mm cavernous right ICA aneurysm and hypoplasia versus stenosis of the right vertebral artery.  She does not have a family history of brain aneurysm.  She does refer former history of smoking (40-50 pack/year) having quit approximately 7 years ago.   Past Medical History:  Diagnosis Date   Depression    GAD (generalized anxiety disorder)    GERD (gastroesophageal reflux disease)    HTN (hypertension)    Multiple thyroid nodules    Other atopic dermatitis 08/17/2020   Seasonal allergies    Urticaria    Vertigo     Past Surgical History:  Procedure Laterality Date   APPENDECTOMY      Allergies: Patient has no known allergies.  Medications: Prior to Admission medications   Medication Sig Start Date End Date Taking? Authorizing Provider  b complex vitamins tablet Take 1 tablet by mouth daily.    [provider]  Calcium 500-125 MG-UNIT TABS Take by mouth.    [provider]  Calcium Citrate (CITRACAL PO) Take 1,200 mg by mouth 3 (three) times daily.     [provider]  desvenlafaxine (PRISTIQ) 50 MG 24 hr tablet Take 1 tablet (50 mg total) by mouth daily. 12/27/20   Mozingo, Berdie Ogren, NP  fluticasone [facility]) 50 MCG/ACT nasal spray USE 2 SPRAYS IN [facility] NOSTRIL EVERY DAY 12/11/20   Isaac Bliss,  Rayford Halsted, MD  glucosamine-chondroitin 500-400 MG tablet Take 1 tablet by mouth daily.    [provider]  levocetirizine (XYZAL) 5 MG tablet TAKE 1 TABLET (5 MG TOTAL) BY MOUTH EVERY EVENING. 10/04/20   Isaac Bliss, Rayford Halsted, MD  LORazepam (ATIVAN) 0.5 MG tablet Take 1 tablet (0.5 mg total) by mouth daily as needed. 02/05/20   Mozingo, Berdie Ogren, NP  meclizine (ANTIVERT) 25 MG tablet Take 1 tablet (25 mg total) by mouth 3 (three) times daily as needed for dizziness. 01/23/21   Milton Ferguson, MD  metoprolol succinate (TOPROL-XL) 25 MG 24 hr tablet Take 25 mg by mouth. Take half tablet daily 01/10/18   [provider]  Multiple Vitamin (MULTIVITAMIN) tablet Take 1 tablet by mouth daily.    [provider]  omega-3 fish oil (MAXEPA) 1000 MG CAPS capsule Take 1 capsule by mouth daily.     [provider]  omeprazole (PRILOSEC) 20 MG capsule Take 1 capsule (20 mg total) by mouth 2 (two) times daily before a meal. 10/08/20   Isaac Bliss, Rayford Halsted, MD  OVER THE COUNTER MEDICATION Glucosamine 1500 mg  Chon 1200 mg    [provider]  Probiotic Product (PROBIOTIC-10 PO) Take by mouth.    [provider]     Family History  Problem Relation Age of Onset   Diabetes Mother    Breast cancer Mother    CAD Father    Hypertension Sister    Breast cancer Sister  Migraines Sister    Migraines Sister    Diabetes Brother    Colon cancer Neg Hx    Esophageal cancer Neg Hx    Pancreatic cancer Neg Hx    Stomach cancer Neg Hx     Social History   Socioeconomic History   Marital status: Single    Spouse name: Not on file   Number of children: Not on file   Years of education: Not on file   Highest education level: Not on file  Occupational History   Not on file  Tobacco Use   Smoking status: Former    Types: Cigarettes    Quit date: 06/21/2014    Years since quitting: 6.6   Smokeless tobacco: Never  Vaping Use   Vaping Use: Never  used  Substance and Sexual Activity   Alcohol use: Not Currently   Drug use: Never   Sexual activity: Not on file  Other Topics Concern   Not on file  Social History Narrative   Right handed   Social Determinants of Health   Financial Resource Strain: Not on file  Food Insecurity: Not on file  Transportation Needs: Not on file  Physical Activity: Not on file  Stress: Not on file  Social Connections: Not on file     Review of Systems: A 12 point ROS discussed and pertinent positives are indicated in the HPI above.  All other systems are negative.  Review of Systems  Constitutional:  Positive for fatigue.  HENT:         Crackling sound in ears.  Neurological:  Positive for dizziness and light-headedness.   Vital Signs: There were no vitals taken for this visit.  Physical Exam Constitutional:      Appearance: Normal appearance.  Neurological:     General: No focal deficit present.     Mental Status: She is alert and oriented to person, place, and time.     Cranial Nerves: No cranial nerve deficit.     Sensory: No sensory deficit.     Motor: No weakness.         Imaging: MR ANGIO HEAD WO CONTRAST  Result Date: 01/23/2021 EXAM: MRI HEAD WITHOUT CONTRAST MRA HEAD WITHOUT CONTRAST MRA OF THE NECK WITHOUT CONTRAST TECHNIQUE: Multiplanar, multi-echo pulse sequences of the brain and surrounding structures were acquired without intravenous contrast. Angiographic images of the Circle of Willis were acquired using MRA technique without intravenous contrast. Angiographic images of the neck were acquired using MRA technique without intravenous contrast. Carotid stenosis measurements (when applicable) are obtained utilizing NASCET criteria, using the distal internal carotid diameter as the denominator. COMPARISON:  MR Head 10/12/2020 FINDINGS: MR HEAD FINDINGS Brain: There is no evidence of acute intracranial hemorrhage, extra-axial fluid collection, or infarct. Extensive foci of  FLAIR signal abnormality are again seen throughout the subcortical and periventricular white matter, similar to the prior study. The ventricles are not enlarged. There is no mass lesion. There is no midline shift. Vascular: See below Skull and upper cervical spine: Normal marrow signal. Sinuses/Orbits: The paranasal sinuses are clear. The globes and orbits are unremarkable. Other: There is degenerative change of the temporomandibular joints, right worse than left. MRA HEAD FINDINGS Anterior circulation: There is a 5-6 mm inferomedially directed aneurysm arising from the cavernous segment of the right ICA (22-82, see key images). The bilateral cavernous ICAs are patent, with no significant stenosis or occlusion. The bilateral ACAs are patent. The bilateral MCAs are patent. No other aneurysm is identified. Posterior  circulation: The right V4 segment is again not identified on the time-of-flight images the left V4 segment is widely patent. The basilar artery is patent. The bilateral PCAs are patent. No aneurysm is identified. MRA NECK FINDINGS Aortic arch: The aortic arch is unremarkable. Right carotid system: Patent, with no hemodynamically significant stenosis, occlusion, dissection, or aneurysm. Left carotid system: Patent, with no hemodynamically significant stenosis, occlusion, dissection, or aneurysm. Vertebral arteries: There is diminutive flow in the cervical portion of the right vertebral artery. The left vertebral artery is patent. Other: None. IMPRESSION: 1. No acute intracranial pathology. 2. Unchanged extensive foci of FLAIR signal abnormality throughout the supratentorial white matter, nonspecific but may reflect sequela of chronic white matter microangiopathy, advanced for age. 3. Diminutive flow related enhancement throughout the cervical portion of the right vertebral artery and non enhancement of the V4 segment (the V4 segment was also nonenhancing on the prior study). This may reflect anatomic  variant, chronic dissection, or multifocal atherosclerotic disease. CTA neck would be helpful to evaluate for atherosclerotic plaque. 4. Otherwise patent vasculature of the head and neck with no other significant stenosis or occlusion. 5. 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 Electronically Signed   By: Valetta Mole M.D.   On: 01/23/2021 15:19   MR ANGIO NECK WO CONTRAST  Result Date: 01/23/2021 EXAM: MRI HEAD WITHOUT CONTRAST MRA HEAD WITHOUT CONTRAST MRA OF THE NECK WITHOUT CONTRAST TECHNIQUE: Multiplanar, multi-echo pulse sequences of the brain and surrounding structures were acquired without intravenous contrast. Angiographic images of the Circle of Willis were acquired using MRA technique without intravenous contrast. Angiographic images of the neck were acquired using MRA technique without intravenous contrast. Carotid stenosis measurements (when applicable) are obtained utilizing NASCET criteria, using the distal internal carotid diameter as the denominator. COMPARISON:  MR Head 10/12/2020 FINDINGS: MR HEAD FINDINGS Brain: There is no evidence of acute intracranial hemorrhage, extra-axial fluid collection, or infarct. Extensive foci of FLAIR signal abnormality are again seen throughout the subcortical and periventricular white matter, similar to the prior study. The ventricles are not enlarged. There is no mass lesion. There is no midline shift. Vascular: See below Skull and upper cervical spine: Normal marrow signal. Sinuses/Orbits: The paranasal sinuses are clear. The globes and orbits are unremarkable. Other: There is degenerative change of the temporomandibular joints, right worse than left. MRA HEAD FINDINGS Anterior circulation: There is a 5-6 mm inferomedially directed aneurysm arising from the cavernous segment of the right ICA (22-82, see key images). The bilateral cavernous ICAs are patent, with no significant stenosis or occlusion.  The bilateral ACAs are patent. The bilateral MCAs are patent. No other aneurysm is identified. Posterior circulation: The right V4 segment is again not identified on the time-of-flight images the left V4 segment is widely patent. The basilar artery is patent. The bilateral PCAs are patent. No aneurysm is identified. MRA NECK FINDINGS Aortic arch: The aortic arch is unremarkable. Right carotid system: Patent, with no hemodynamically significant stenosis, occlusion, dissection, or aneurysm. Left carotid system: Patent, with no hemodynamically significant stenosis, occlusion, dissection, or aneurysm. Vertebral arteries: There is diminutive flow in the cervical portion of the right vertebral artery. The left vertebral artery is patent. Other: None. IMPRESSION: 1. No acute intracranial pathology. 2. Unchanged extensive foci of FLAIR signal abnormality throughout the supratentorial white matter, nonspecific but may reflect sequela of chronic white matter microangiopathy, advanced for age. 3. Diminutive flow related enhancement throughout the cervical portion of the right vertebral  artery and non enhancement of the V4 segment (the V4 segment was also nonenhancing on the prior study). This may reflect anatomic variant, chronic dissection, or multifocal atherosclerotic disease. CTA neck would be helpful to evaluate for atherosclerotic plaque. 4. Otherwise patent vasculature of the head and neck with no other significant stenosis or occlusion. 5. 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 Electronically Signed   By: Valetta Mole M.D.   On: 01/23/2021 15:19   MR BRAIN WO CONTRAST  Result Date: 01/23/2021 EXAM: MRI HEAD WITHOUT CONTRAST MRA HEAD WITHOUT CONTRAST MRA OF THE NECK WITHOUT CONTRAST TECHNIQUE: Multiplanar, multi-echo pulse sequences of the brain and surrounding structures were acquired without intravenous contrast. Angiographic images of the Circle of  Willis were acquired using MRA technique without intravenous contrast. Angiographic images of the neck were acquired using MRA technique without intravenous contrast. Carotid stenosis measurements (when applicable) are obtained utilizing NASCET criteria, using the distal internal carotid diameter as the denominator. COMPARISON:  MR Head 10/12/2020 FINDINGS: MR HEAD FINDINGS Brain: There is no evidence of acute intracranial hemorrhage, extra-axial fluid collection, or infarct. Extensive foci of FLAIR signal abnormality are again seen throughout the subcortical and periventricular white matter, similar to the prior study. The ventricles are not enlarged. There is no mass lesion. There is no midline shift. Vascular: See below Skull and upper cervical spine: Normal marrow signal. Sinuses/Orbits: The paranasal sinuses are clear. The globes and orbits are unremarkable. Other: There is degenerative change of the temporomandibular joints, right worse than left. MRA HEAD FINDINGS Anterior circulation: There is a 5-6 mm inferomedially directed aneurysm arising from the cavernous segment of the right ICA (22-82, see key images). The bilateral cavernous ICAs are patent, with no significant stenosis or occlusion. The bilateral ACAs are patent. The bilateral MCAs are patent. No other aneurysm is identified. Posterior circulation: The right V4 segment is again not identified on the time-of-flight images the left V4 segment is widely patent. The basilar artery is patent. The bilateral PCAs are patent. No aneurysm is identified. MRA NECK FINDINGS Aortic arch: The aortic arch is unremarkable. Right carotid system: Patent, with no hemodynamically significant stenosis, occlusion, dissection, or aneurysm. Left carotid system: Patent, with no hemodynamically significant stenosis, occlusion, dissection, or aneurysm. Vertebral arteries: There is diminutive flow in the cervical portion of the right vertebral artery. The left vertebral  artery is patent. Other: None. IMPRESSION: 1. No acute intracranial pathology. 2. Unchanged extensive foci of FLAIR signal abnormality throughout the supratentorial white matter, nonspecific but may reflect sequela of chronic white matter microangiopathy, advanced for age. 3. Diminutive flow related enhancement throughout the cervical portion of the right vertebral artery and non enhancement of the V4 segment (the V4 segment was also nonenhancing on the prior study). This may reflect anatomic variant, chronic dissection, or multifocal atherosclerotic disease. CTA neck would be helpful to evaluate for atherosclerotic plaque. 4. Otherwise patent vasculature of the head and neck with no other significant stenosis or occlusion. 5. 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 Electronically Signed   By: Valetta Mole M.D.   On: 01/23/2021 15:19    Labs:  CBC: Recent Labs    01/23/21 1225  WBC 4.9  HGB 14.6  HCT 43.2  PLT 299    COAGS: No results for input(s): INR, APTT in the last 8760 hours.  BMP: Recent Labs    01/23/21 1225  NA 138  K 4.5  CL 106  CO2 24  GLUCOSE 98  BUN 12  CALCIUM 9.4  CREATININE 0.71  GFRNONAA >60    LIVER FUNCTION TESTS: Recent Labs    05/01/20 0000 01/23/21 1225  BILITOT  --  1.0  AST  --  32  ALT  --  28  ALKPHOS  --  113  PROT 7.5 7.7  ALBUMIN  --  4.2    TUMOR MARKERS: No results for input(s): AFPTM, CEA, CA199, CHROMGRNA in the last 8760 hours.  Assessment and Plan:  I reviewed the MRI and MRA findings with Mrs. Shonka and her sister.  I recommended a diagnostic cerebral angiogram to better evaluate the right ICA aneurysm as well as the right vertebral artery to determine whether its small caliber may be a congenital versus acquired finding.  I explained that while her right vertebral artery is small, there seems to be adequate flow via left vertebral artery.  I also explained that the  right ICA aneurysm is an incidental finding and unlikely to be the cause of any of her symptoms.  I recommended she follow up with ENT and neurology with regards to vertigo and ear crackling/hearing loss.  Our schedulers will reach out to Mrs. Borrelli to book a diagnostic cerebral angiogram under moderate sedation.  We will discuss management based upon angiogram findings.   Thank you for this interesting consult.  I greatly enjoyed meeting Lucresha A Bache and look forward to participating in her care.  A copy of this report was sent to the requesting provider on this date.  Electronically Signed: Pedro Earls, MD 01/24/2021, 3:16 PM   I spent a total of  40 Minutes   in face to face in clinical consultation, greater than 50% of which was counseling/coordinating care for brain aneurysm.

## 2021-01-24 NOTE — Progress Notes (Signed)
Established Patient Office Visit     This visit occurred during the SARS-CoV-2 public health emergency.  Safety protocols were in place, including screening questions prior to the visit, additional usage of staff PPE, and extensive cleaning of exam room while observing appropriate contact time as indicated for disinfecting solutions.    CC/Reason for Visit: ED follow-up  HPI: Kimberly Conway is a 65 y.o. female who is coming in today for the above mentioned reasons.  This visit was initially scheduled as a physical but we have decided to do a hospital follow-up instead.  I saw her yesterday with acute imbalance and some exacerbation of her chronic dizziness.  I was concerned for posterior circulation CVA and so she was sent to the emergency department.  I will insert the results of her MRI/MRA of head and neck below:  IMPRESSION: 1. No acute intracranial pathology. 2. Unchanged extensive foci of FLAIR signal abnormality throughout the supratentorial white matter, nonspecific but may reflect sequela of chronic white matter microangiopathy, advanced for age. 3. Diminutive flow related enhancement throughout the cervical portion of the right vertebral artery and non enhancement of the V4 segment (the V4 segment was also nonenhancing on the prior study). This may reflect anatomic variant, chronic dissection, or multifocal atherosclerotic disease. CTA neck would be helpful to evaluate for atherosclerotic plaque. 4. Otherwise patent vasculature of the head and neck with no other significant stenosis or occlusion. 5. 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  After discussion with neurologist, decision was made to start her on baby aspirin and to schedule a follow-up with neuro interventionalist.  That appointment is scheduled for today.   Past Medical/Surgical History: Past Medical History:  Diagnosis Date   Depression     GAD (generalized anxiety disorder)    GERD (gastroesophageal reflux disease)    HTN (hypertension)    Multiple thyroid nodules    Other atopic dermatitis 08/17/2020   Seasonal allergies    Urticaria    Vertigo     Past Surgical History:  Procedure Laterality Date   APPENDECTOMY      Social History:  reports that she quit smoking about 6 years ago. Her smoking use included cigarettes. She has never used smokeless tobacco. She reports that she does not currently use alcohol. She reports that she does not use drugs.  Allergies: No Known Allergies  Family History:  Family History  Problem Relation Age of Onset   Diabetes Mother    Breast cancer Mother    CAD Father    Hypertension Sister    Breast cancer Sister    Migraines Sister    Migraines Sister    Diabetes Brother    Colon cancer Neg Hx    Esophageal cancer Neg Hx    Pancreatic cancer Neg Hx    Stomach cancer Neg Hx      Current Outpatient Medications:    b complex vitamins tablet, Take 1 tablet by mouth daily., Disp: , Rfl:    Calcium 500-125 MG-UNIT TABS, Take by mouth., Disp: , Rfl:    Calcium Citrate (CITRACAL PO), Take 1,200 mg by mouth 3 (three) times daily. , Disp: , Rfl:    desvenlafaxine (PRISTIQ) 50 MG 24 hr tablet, Take 1 tablet (50 mg total) by mouth daily., Disp: 90 tablet, Rfl: 2   fluticasone (FLONASE) 50 MCG/ACT nasal spray, USE 2 SPRAYS IN EACH NOSTRIL EVERY DAY, Disp: 48 g, Rfl: 0  glucosamine-chondroitin 500-400 MG tablet, Take 1 tablet by mouth daily., Disp: , Rfl:    levocetirizine (XYZAL) 5 MG tablet, TAKE 1 TABLET (5 MG TOTAL) BY MOUTH EVERY EVENING., Disp: 90 tablet, Rfl: 1   LORazepam (ATIVAN) 0.5 MG tablet, Take 1 tablet (0.5 mg total) by mouth daily as needed., Disp: 30 tablet, Rfl: 2   meclizine (ANTIVERT) 25 MG tablet, Take 1 tablet (25 mg total) by mouth 3 (three) times daily as needed for dizziness., Disp: 30 tablet, Rfl: 0   metoprolol succinate (TOPROL-XL) 25 MG 24 hr tablet, Take  25 mg by mouth. Take half tablet daily, Disp: , Rfl:    Multiple Vitamin (MULTIVITAMIN) tablet, Take 1 tablet by mouth daily., Disp: , Rfl:    omega-3 fish oil (MAXEPA) 1000 MG CAPS capsule, Take 1 capsule by mouth daily. , Disp: , Rfl:    omeprazole (PRILOSEC) 20 MG capsule, Take 1 capsule (20 mg total) by mouth 2 (two) times daily before a meal., Disp: 180 capsule, Rfl: 3   OVER THE COUNTER MEDICATION, Glucosamine 1500 mg  Chon 1200 mg, Disp: , Rfl:    Probiotic Product (PROBIOTIC-10 PO), Take by mouth., Disp: , Rfl:   Review of Systems:  Constitutional: Denies fever, chills, diaphoresis, appetite change and fatigue.  HEENT: Denies photophobia, eye pain, redness, hearing loss, ear pain, congestion, sore throat, rhinorrhea, sneezing, mouth sores, trouble swallowing, neck pain, neck stiffness and tinnitus.   Respiratory: Denies SOB, DOE, cough, chest tightness,  and wheezing.   Cardiovascular: Denies chest pain, palpitations and leg swelling.  Gastrointestinal: Denies nausea, vomiting, abdominal pain, diarrhea, constipation, blood in stool and abdominal distention.  Genitourinary: Denies dysuria, urgency, frequency, hematuria, flank pain and difficulty urinating.  Endocrine: Denies: hot or cold intolerance, sweats, changes in hair or nails, polyuria, polydipsia. Musculoskeletal: Denies myalgias, back pain, joint swelling, arthralgias and gait problem.  Skin: Denies pallor, rash and wound.  Neurological: Denies seizures, syncope, weakness, light-headedness, numbness and headaches.  Hematological: Denies adenopathy. Easy bruising, personal or family bleeding history  Psychiatric/Behavioral: Denies suicidal ideation, mood changes, confusion, nervousness, sleep disturbance and agitation    Physical Exam: Vitals:   01/24/21 1031  BP: 110/80  Pulse: (!) 59  Temp: 98.1 F (36.7 C)  TempSrc: Oral  SpO2: 99%  Weight: 162 lb 4.8 oz (73.6 kg)    Body mass index is 29.69  kg/m.   Constitutional: NAD, calm, comfortable Eyes: PERRL, lids and conjunctivae normal ENMT: Mucous membranes are moist.  Respiratory: clear to auscultation bilaterally, no wheezing, no crackles. Normal respiratory effort. No accessory muscle use.  Cardiovascular: Regular rate and rhythm, no murmurs / rubs / gallops. No extremity edema.   Impression and Plan:  Aneurysm of carotid artery ([place]) -She has follow-up with interventional radiology today. [facility] charts have been reviewed in detail. -She should follow-up with neurology soon.  Time spent: 22 minutes reviewing chart, interviewing and examining patient and formulating plan of care.    Lelon Frohlich, MD [facility]

## 2021-01-31 ENCOUNTER — Other Ambulatory Visit (HOSPITAL_COMMUNITY): Payer: Self-pay | Admitting: Neuroradiology

## 2021-01-31 DIAGNOSIS — I671 Cerebral aneurysm, nonruptured: Secondary | ICD-10-CM

## 2021-02-13 ENCOUNTER — Other Ambulatory Visit (HOSPITAL_COMMUNITY): Payer: Self-pay | Admitting: Physician Assistant

## 2021-02-13 ENCOUNTER — Other Ambulatory Visit: Payer: Self-pay | Admitting: Radiology

## 2021-02-13 NOTE — H&P (Signed)
Chief Complaint: Patient was seen in consultation today for brain aneurysm and vertebral artery stenosis  Referring Physician(s): Dr. Roderic Palau  Supervising Physician: Pedro Earls  Patient Status: [facility] - Out-pt  History of Present Illness: Kimberly Conway is a 65 y.o. female with a medical history significant for depression, HTN, and vertigo who presented to the ED 01/23/21 with worsening dizziness, imbalance and fogginess. MR angiogram revealed a 6 mm cavernous right ICA aneurysm and hypoplasia versus stenosis of the right vertebral artery. The patient met with Dr. Karenann Cai to discuss possible diagnostic cerebral angiogram as an outpatient. The patient was in agreement to proceed and she presents today for this procedure.   Kimberly Conway presents today in her usual state of health. She has no complaints today.  She is anxious, but understanding of the goals of the procedure.  She is agreeable to proceed. She has been NPO.  Past Medical History:  Diagnosis Date   Depression    GAD (generalized anxiety disorder)    GERD (gastroesophageal reflux disease)    HTN (hypertension)    Multiple thyroid nodules    Other atopic dermatitis 08/17/2020   Seasonal allergies    Urticaria    Vertigo     Past Surgical History:  Procedure Laterality Date   APPENDECTOMY      Allergies: Patient has no known allergies.  Medications: Prior to Admission medications   Medication Sig Start Date End Date Taking? Authorizing Provider  aspirin EC 81 MG tablet Take 81 mg by mouth daily. Swallow whole.   Yes [provider]  b complex vitamins tablet Take 1 tablet by mouth daily.   Yes [provider]  Calcium Citrate (CITRACAL PO) Take 1,200 mg by mouth in the morning and at bedtime.   Yes [provider]  desvenlafaxine (PRISTIQ) 50 MG 24 hr tablet Take 1 tablet (50 mg total) by mouth daily. 12/27/20  Yes Mozingo, Berdie Ogren, NP  fluticasone [place])  50 MCG/ACT nasal spray USE 2 SPRAYS IN [facility] NOSTRIL EVERY DAY 12/11/20  Yes Isaac Bliss, Rayford Halsted, MD  glucosamine-chondroitin 500-400 MG tablet Take 1 tablet by mouth daily.   Yes [provider]  levocetirizine (XYZAL) 5 MG tablet TAKE 1 TABLET (5 MG TOTAL) BY MOUTH EVERY EVENING. 10/04/20  Yes Isaac Bliss, Rayford Halsted, MD  LORazepam (ATIVAN) 0.5 MG tablet Take 1 tablet (0.5 mg total) by mouth daily as needed. 02/05/20  Yes Mozingo, Berdie Ogren, NP  meclizine (ANTIVERT) 25 MG tablet Take 1 tablet (25 mg total) by mouth 3 (three) times daily as needed for dizziness. 01/23/21  Yes Milton Ferguson, MD  metoprolol succinate (TOPROL-XL) 25 MG 24 hr tablet Take 12.5 mg by mouth at bedtime. Take half tablet daily 01/10/18  Yes [provider]  Multiple Vitamin (MULTIVITAMIN) tablet Take 1 tablet by mouth daily.   Yes [provider]  Omega-3 Fatty Acids (OMEGA-3 FISH OIL PO) Take 2 capsules by mouth daily.   Yes [provider]  omeprazole (PRILOSEC) 20 MG capsule Take 1 capsule (20 mg total) by mouth 2 (two) times daily before a meal. 10/08/20  Yes Isaac Bliss, Rayford Halsted, MD  Probiotic Product (PROBIOTIC-10 PO) Take 1 capsule by mouth daily.   Yes [provider]  Turmeric 500 MG CAPS Take 500 mg by mouth daily.   Yes [provider]     Family History  Problem Relation Age of Onset   Diabetes Mother    Breast  cancer Mother    CAD Father    Hypertension Sister    Breast cancer Sister    Migraines Sister    Migraines Sister    Diabetes Brother    Colon cancer Neg Hx    Esophageal cancer Neg Hx    Pancreatic cancer Neg Hx    Stomach cancer Neg Hx     Social History   Socioeconomic History   Marital status: Single    Spouse name: Not on file   Number of children: Not on file   Years of education: Not on file   Highest education level: Not on file  Occupational History   Not on file  Tobacco Use   Smoking status: Former     Types: Cigarettes    Quit date: 06/21/2014    Years since quitting: 6.6   Smokeless tobacco: Never  Vaping Use   Vaping Use: Never used  Substance and Sexual Activity   Alcohol use: Not Currently   Drug use: Never   Sexual activity: Not on file  Other Topics Concern   Not on file  Social History Narrative   Right handed   Social Determinants of Health   Financial Resource Strain: Not on file  Food Insecurity: Not on file  Transportation Needs: Not on file  Physical Activity: Not on file  Stress: Not on file  Social Connections: Not on file    Review of Systems: A 12 point ROS discussed and pertinent positives are indicated in the HPI above.  All other systems are negative.  Review of Systems  Constitutional:  Negative for fatigue and fever.  Respiratory:  Negative for cough and shortness of breath.   Cardiovascular:  Negative for chest pain.  Gastrointestinal:  Negative for abdominal pain.  Genitourinary:  Negative for dysuria.  Musculoskeletal:  Negative for back pain.  Psychiatric/Behavioral:  Negative for behavioral problems and confusion.    Vital Signs: There were no vitals taken for this visit.  Physical Exam Vitals and nursing note reviewed.  Constitutional:      General: She is not in acute distress.    Appearance: Normal appearance. She is not ill-appearing.  HENT:     Mouth/Throat:     Mouth: Mucous membranes are moist.     Pharynx: Oropharynx is clear.  Cardiovascular:     Rate and Rhythm: Normal rate and regular rhythm.  Pulmonary:     Effort: Pulmonary effort is normal.     Breath sounds: Normal breath sounds.  Abdominal:     General: Abdomen is flat.     Palpations: Abdomen is soft.  Musculoskeletal:     Cervical back: Normal range of motion and neck supple.  Skin:    General: Skin is warm and dry.  Neurological:     General: No focal deficit present.     Mental Status: She is alert and oriented to person, place, and time. Mental status is at  baseline.  Psychiatric:        Mood and Affect: Mood normal.        Behavior: Behavior normal.        Thought Content: Thought content normal.        Judgment: Judgment normal.    Imaging: MR ANGIO HEAD WO CONTRAST  Result Date: 01/23/2021 EXAM: MRI HEAD WITHOUT CONTRAST MRA HEAD WITHOUT CONTRAST MRA OF THE NECK WITHOUT CONTRAST TECHNIQUE: Multiplanar, multi-echo pulse sequences of the brain and surrounding structures were acquired without intravenous contrast. Angiographic images of the Circle  of Willis were acquired using MRA technique without intravenous contrast. Angiographic images of the neck were acquired using MRA technique without intravenous contrast. Carotid stenosis measurements (when applicable) are obtained utilizing NASCET criteria, using the distal internal carotid diameter as the denominator. COMPARISON:  MR Head 10/12/2020 FINDINGS: MR HEAD FINDINGS Brain: There is no evidence of acute intracranial hemorrhage, extra-axial fluid collection, or infarct. Extensive foci of FLAIR signal abnormality are again seen throughout the subcortical and periventricular white matter, similar to the prior study. The ventricles are not enlarged. There is no mass lesion. There is no midline shift. Vascular: See below Skull and upper cervical spine: Normal marrow signal. Sinuses/Orbits: The paranasal sinuses are clear. The globes and orbits are unremarkable. Other: There is degenerative change of the temporomandibular joints, right worse than left. MRA HEAD FINDINGS Anterior circulation: There is a 5-6 mm inferomedially directed aneurysm arising from the cavernous segment of the right ICA (22-82, see key images). The bilateral cavernous ICAs are patent, with no significant stenosis or occlusion. The bilateral ACAs are patent. The bilateral MCAs are patent. No other aneurysm is identified. Posterior circulation: The right V4 segment is again not identified on the time-of-flight images the left V4 segment is  widely patent. The basilar artery is patent. The bilateral PCAs are patent. No aneurysm is identified. MRA NECK FINDINGS Aortic arch: The aortic arch is unremarkable. Right carotid system: Patent, with no hemodynamically significant stenosis, occlusion, dissection, or aneurysm. Left carotid system: Patent, with no hemodynamically significant stenosis, occlusion, dissection, or aneurysm. Vertebral arteries: There is diminutive flow in the cervical portion of the right vertebral artery. The left vertebral artery is patent. Other: None. IMPRESSION: 1. No acute intracranial pathology. 2. Unchanged extensive foci of FLAIR signal abnormality throughout the supratentorial white matter, nonspecific but may reflect sequela of chronic white matter microangiopathy, advanced for age. 3. Diminutive flow related enhancement throughout the cervical portion of the right vertebral artery and non enhancement of the V4 segment (the V4 segment was also nonenhancing on the prior study). This may reflect anatomic variant, chronic dissection, or multifocal atherosclerotic disease. CTA neck would be helpful to evaluate for atherosclerotic plaque. 4. Otherwise patent vasculature of the head and neck with no other significant stenosis or occlusion. 5. 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 Electronically Signed   By: Valetta Mole M.D.   On: 01/23/2021 15:19   MR ANGIO NECK WO CONTRAST  Result Date: 01/23/2021 EXAM: MRI HEAD WITHOUT CONTRAST MRA HEAD WITHOUT CONTRAST MRA OF THE NECK WITHOUT CONTRAST TECHNIQUE: Multiplanar, multi-echo pulse sequences of the brain and surrounding structures were acquired without intravenous contrast. Angiographic images of the Circle of Willis were acquired using MRA technique without intravenous contrast. Angiographic images of the neck were acquired using MRA technique without intravenous contrast. Carotid stenosis measurements (when  applicable) are obtained utilizing NASCET criteria, using the distal internal carotid diameter as the denominator. COMPARISON:  MR Head 10/12/2020 FINDINGS: MR HEAD FINDINGS Brain: There is no evidence of acute intracranial hemorrhage, extra-axial fluid collection, or infarct. Extensive foci of FLAIR signal abnormality are again seen throughout the subcortical and periventricular white matter, similar to the prior study. The ventricles are not enlarged. There is no mass lesion. There is no midline shift. Vascular: See below Skull and upper cervical spine: Normal marrow signal. Sinuses/Orbits: The paranasal sinuses are clear. The globes and orbits are unremarkable. Other: There is degenerative change of the temporomandibular joints, right worse than left. MRA  HEAD FINDINGS Anterior circulation: There is a 5-6 mm inferomedially directed aneurysm arising from the cavernous segment of the right ICA (22-82, see key images). The bilateral cavernous ICAs are patent, with no significant stenosis or occlusion. The bilateral ACAs are patent. The bilateral MCAs are patent. No other aneurysm is identified. Posterior circulation: The right V4 segment is again not identified on the time-of-flight images the left V4 segment is widely patent. The basilar artery is patent. The bilateral PCAs are patent. No aneurysm is identified. MRA NECK FINDINGS Aortic arch: The aortic arch is unremarkable. Right carotid system: Patent, with no hemodynamically significant stenosis, occlusion, dissection, or aneurysm. Left carotid system: Patent, with no hemodynamically significant stenosis, occlusion, dissection, or aneurysm. Vertebral arteries: There is diminutive flow in the cervical portion of the right vertebral artery. The left vertebral artery is patent. Other: None. IMPRESSION: 1. No acute intracranial pathology. 2. Unchanged extensive foci of FLAIR signal abnormality throughout the supratentorial white matter, nonspecific but may reflect  sequela of chronic white matter microangiopathy, advanced for age. 3. Diminutive flow related enhancement throughout the cervical portion of the right vertebral artery and non enhancement of the V4 segment (the V4 segment was also nonenhancing on the prior study). This may reflect anatomic variant, chronic dissection, or multifocal atherosclerotic disease. CTA neck would be helpful to evaluate for atherosclerotic plaque. 4. Otherwise patent vasculature of the head and neck with no other significant stenosis or occlusion. 5. 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 Electronically Signed   By: Valetta Mole M.D.   On: 01/23/2021 15:19   MR BRAIN WO CONTRAST  Result Date: 01/23/2021 EXAM: MRI HEAD WITHOUT CONTRAST MRA HEAD WITHOUT CONTRAST MRA OF THE NECK WITHOUT CONTRAST TECHNIQUE: Multiplanar, multi-echo pulse sequences of the brain and surrounding structures were acquired without intravenous contrast. Angiographic images of the Circle of Willis were acquired using MRA technique without intravenous contrast. Angiographic images of the neck were acquired using MRA technique without intravenous contrast. Carotid stenosis measurements (when applicable) are obtained utilizing NASCET criteria, using the distal internal carotid diameter as the denominator. COMPARISON:  MR Head 10/12/2020 FINDINGS: MR HEAD FINDINGS Brain: There is no evidence of acute intracranial hemorrhage, extra-axial fluid collection, or infarct. Extensive foci of FLAIR signal abnormality are again seen throughout the subcortical and periventricular white matter, similar to the prior study. The ventricles are not enlarged. There is no mass lesion. There is no midline shift. Vascular: See below Skull and upper cervical spine: Normal marrow signal. Sinuses/Orbits: The paranasal sinuses are clear. The globes and orbits are unremarkable. Other: There is degenerative change of the  temporomandibular joints, right worse than left. MRA HEAD FINDINGS Anterior circulation: There is a 5-6 mm inferomedially directed aneurysm arising from the cavernous segment of the right ICA (22-82, see key images). The bilateral cavernous ICAs are patent, with no significant stenosis or occlusion. The bilateral ACAs are patent. The bilateral MCAs are patent. No other aneurysm is identified. Posterior circulation: The right V4 segment is again not identified on the time-of-flight images the left V4 segment is widely patent. The basilar artery is patent. The bilateral PCAs are patent. No aneurysm is identified. MRA NECK FINDINGS Aortic arch: The aortic arch is unremarkable. Right carotid system: Patent, with no hemodynamically significant stenosis, occlusion, dissection, or aneurysm. Left carotid system: Patent, with no hemodynamically significant stenosis, occlusion, dissection, or aneurysm. Vertebral arteries: There is diminutive flow in the cervical portion of the right vertebral artery. The  left vertebral artery is patent. Other: None. IMPRESSION: 1. No acute intracranial pathology. 2. Unchanged extensive foci of FLAIR signal abnormality throughout the supratentorial white matter, nonspecific but may reflect sequela of chronic white matter microangiopathy, advanced for age. 3. Diminutive flow related enhancement throughout the cervical portion of the right vertebral artery and non enhancement of the V4 segment (the V4 segment was also nonenhancing on the prior study). This may reflect anatomic variant, chronic dissection, or multifocal atherosclerotic disease. CTA neck would be helpful to evaluate for atherosclerotic plaque. 4. Otherwise patent vasculature of the head and neck with no other significant stenosis or occlusion. 5. 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 Electronically Signed   By: Valetta Mole M.D.   On: 01/23/2021 15:19     Labs:  CBC: Recent Labs    01/23/21 1225  WBC 4.9  HGB 14.6  HCT 43.2  PLT 299    COAGS: No results for input(s): INR, APTT in the last 8760 hours.  BMP: Recent Labs    01/23/21 1225  NA 138  K 4.5  CL 106  CO2 24  GLUCOSE 98  BUN 12  CALCIUM 9.4  CREATININE 0.71  GFRNONAA >60    LIVER FUNCTION TESTS: Recent Labs    05/01/20 0000 01/23/21 1225  BILITOT  --  1.0  AST  --  32  ALT  --  28  ALKPHOS  --  113  PROT 7.5 7.7  ALBUMIN  --  4.2    TUMOR MARKERS: No results for input(s): AFPTM, CEA, CA199, CHROMGRNA in the last 8760 hours.  Assessment and Plan: Brain aneurysm and vertebral artery stenosis: Kimberly Conway, 65 year old female, presents today to the [facility] department for an image-guided diagnostic cerebral angiogram.  Risks and benefits of this procedure were discussed with the patient including, but not limited to bleeding, infection, vascular injury or contrast induced renal failure.  This interventional procedure involves the use of X-rays and because of the nature of the planned procedure, it is possible that we will have prolonged use of X-ray fluoroscopy.  Potential radiation risks to you include (but are not limited to) the following: - A slightly elevated risk for cancer  several years later in life. This risk is typically less than 0.5% percent. This risk is low in comparison to the normal incidence of human cancer, which is 33% for women and 50% for men according to the [place]. - Radiation induced injury can include skin redness, resembling a rash, tissue breakdown / ulcers and hair loss (which can be temporary or permanent).   The likelihood of either of these occurring depends on the difficulty of the procedure and whether you are sensitive to radiation due to previous procedures, disease, or genetic conditions.   IF your procedure requires a prolonged use of radiation, you will be notified  and given written instructions for further action.  It is your responsibility to monitor the irradiated area for the 2 weeks following the procedure and to notify your physician if you are concerned that you have suffered a radiation induced injury.    All of the patient's questions were answered, patient is agreeable to proceed.  Consent signed and in chart.   Thank you for this interesting consult.  I greatly enjoyed meeting Kimberly Conway and look forward to participating in their care.  A copy of this report was sent to the requesting provider on this  date.  Electronically Signed: Soyla Dryer, AGACNP-BC [phone] 02/13/2021, 3:48 PM   I spent a total of  30 Minutes   in face to face in clinical consultation, greater than 50% of which was counseling/coordinating care for brain aneurysm.

## 2021-02-14 ENCOUNTER — Other Ambulatory Visit (HOSPITAL_COMMUNITY): Payer: Self-pay | Admitting: Neuroradiology

## 2021-02-14 ENCOUNTER — Encounter (HOSPITAL_COMMUNITY): Payer: Self-pay

## 2021-02-14 ENCOUNTER — Other Ambulatory Visit: Payer: Self-pay

## 2021-02-14 ENCOUNTER — Ambulatory Visit (HOSPITAL_COMMUNITY)
Admission: RE | Admit: 2021-02-14 | Discharge: 2021-02-14 | Disposition: A | Discharge status: Home / Self Care | Payer: Medicare HMO | Source: Ambulatory Visit | Attending: Neuroradiology | Admitting: Neuroradiology

## 2021-02-14 DIAGNOSIS — I671 Cerebral aneurysm, nonruptured: Secondary | ICD-10-CM | POA: Insufficient documentation

## 2021-02-14 DIAGNOSIS — Z87891 Personal history of nicotine dependence: Secondary | ICD-10-CM | POA: Diagnosis not present

## 2021-02-14 DIAGNOSIS — I10 Essential (primary) hypertension: Secondary | ICD-10-CM | POA: Insufficient documentation

## 2021-02-14 DIAGNOSIS — F32A Depression, unspecified: Secondary | ICD-10-CM | POA: Insufficient documentation

## 2021-02-14 DIAGNOSIS — Z79899 Other long term (current) drug therapy: Secondary | ICD-10-CM | POA: Diagnosis not present

## 2021-02-14 DIAGNOSIS — Z7982 Long term (current) use of aspirin: Secondary | ICD-10-CM | POA: Diagnosis not present

## 2021-02-14 DIAGNOSIS — I6501 Occlusion and stenosis of right vertebral artery: Secondary | ICD-10-CM | POA: Diagnosis not present

## 2021-02-14 HISTORY — PX: IR ANGIO INTRA EXTRACRAN SEL COM CAROTID INNOMINATE UNI R MOD SED: IMG5359

## 2021-02-14 HISTORY — PX: IR ANGIO VERTEBRAL SEL VERTEBRAL UNI L MOD SED: IMG5367

## 2021-02-14 HISTORY — PX: IR US GUIDE VASC ACCESS RIGHT: IMG2390

## 2021-02-14 HISTORY — PX: IR ANGIO INTRA EXTRACRAN SEL INTERNAL CAROTID UNI L MOD SED: IMG5361

## 2021-02-14 HISTORY — PX: IR ANGIO VERTEBRAL SEL SUBCLAVIAN INNOMINATE UNI R MOD SED: IMG5365

## 2021-02-14 LAB — CBC
HCT: 43.8 % (ref 36.0–46.0)
Hemoglobin: 14.5 g/dL (ref 12.0–15.0)
MCH: 31.3 pg (ref 26.0–34.0)
MCHC: 33.1 g/dL (ref 30.0–36.0)
MCV: 94.6 fL (ref 80.0–100.0)
Platelets: 297 10*3/uL (ref 150–400)
RBC: 4.63 MIL/uL (ref 3.87–5.11)
RDW: 12.6 % (ref 11.5–15.5)
WBC: 5.6 10*3/uL (ref 4.0–10.5)
nRBC: 0 % (ref 0.0–0.2)

## 2021-02-14 LAB — BASIC METABOLIC PANEL
Anion gap: 8 (ref 5–15)
BUN: 11 mg/dL (ref 8–23)
CO2: 26 mmol/L (ref 22–32)
Calcium: 9 mg/dL (ref 8.9–10.3)
Chloride: 103 mmol/L (ref 98–111)
Creatinine, Ser: 0.89 mg/dL (ref 0.44–1.00)
GFR, Estimated: >60 mL/min (ref >60)
Glucose, Bld: 91 mg/dL (ref 70–99)
Potassium: 4.2 mmol/L (ref 3.5–5.1)
Sodium: 137 mmol/L (ref 135–145)

## 2021-02-14 LAB — PROTIME-INR
INR: 0.9 (ref 0.8–1.2)
Prothrombin Time: 12.6 seconds (ref 11.4–15.2)

## 2021-02-14 IMAGING — XA IR CARO CERE HEAD/NECK UNILAT RIGHT (MS)
12 of 16 series · 12 of 24 positions shown · IV contrast (IODINE)
Comparison: MR angiogram of the head and neck [DATE]

INDICATION: BUSCH is a 64 year old female with a medical history
significant for depression, HTN, and vertigo who presented to the ED
[DATE] with worsening dizziness, imbalance and fogginess. MR
angiogram revealed a 6 mm cavernous right ICA aneurysm and
hypoplasia versus stenosis of the right vertebral artery. She comes
to our service today for a diagnostic cerebral angiogram to better
evaluate MRI findings.

EXAM:
ULTRASOUND-GUIDED VASCULAR [REDACTED] CEREBRAL ANGIOGRAM
TECHNIQUE: Informed written consent was obtained from the patient after a
thorough discussion of the procedural risks, benefits and
alternatives. All questions were addressed.

[Series 1: fl neuro · 1 of 29 frames shown]
[frame 15/29]
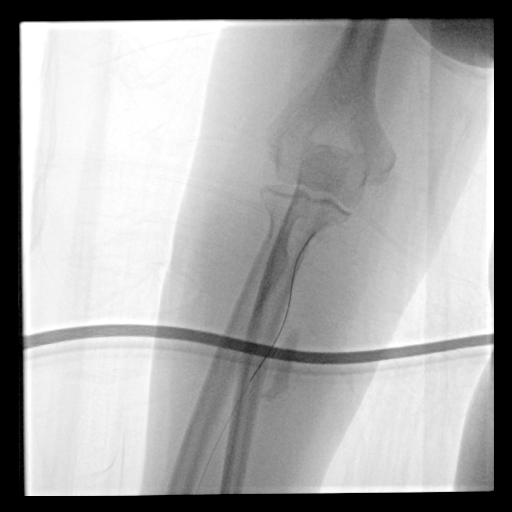

[Series 3: fl neuro n · 1 of 49 frames shown]
[frame 1/49]
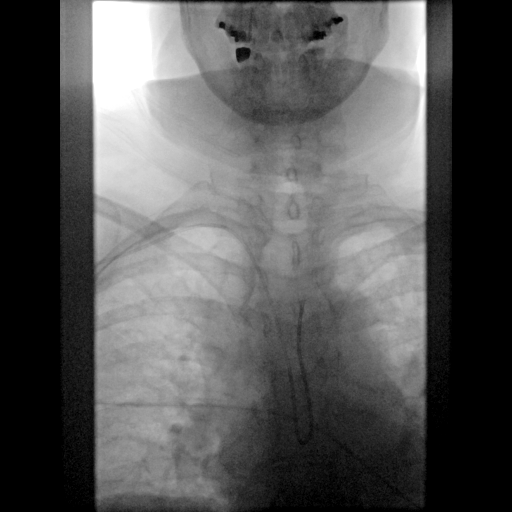

[Series 4: carotid care 2 · 2 acquisitions, 1 frame shown (1 of 8)]
[im 1/2]
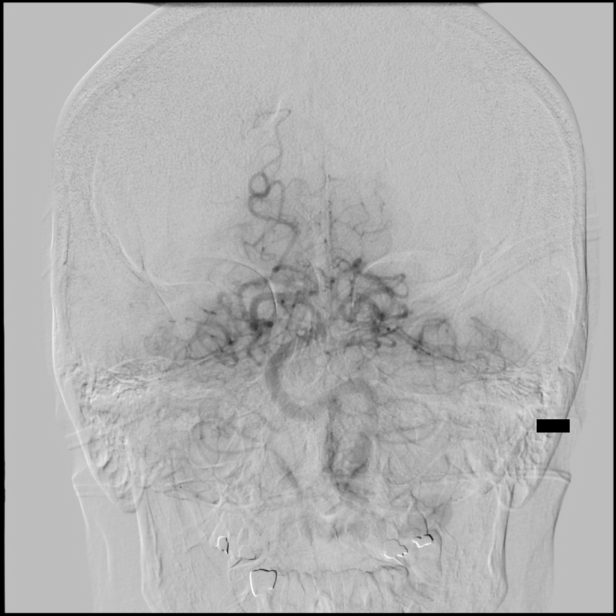

[Series 5: carotid care 2 · 2 acquisitions, 1 frame shown (2 of 8)]
[im 1/2]
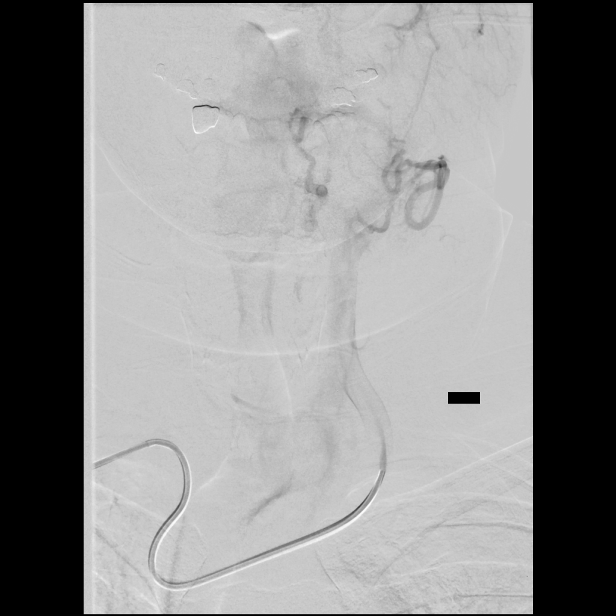

[Series 7: carotid care 2 · 2 acquisitions, 1 frame shown (3 of 8)]
[im 1/2  full-range]
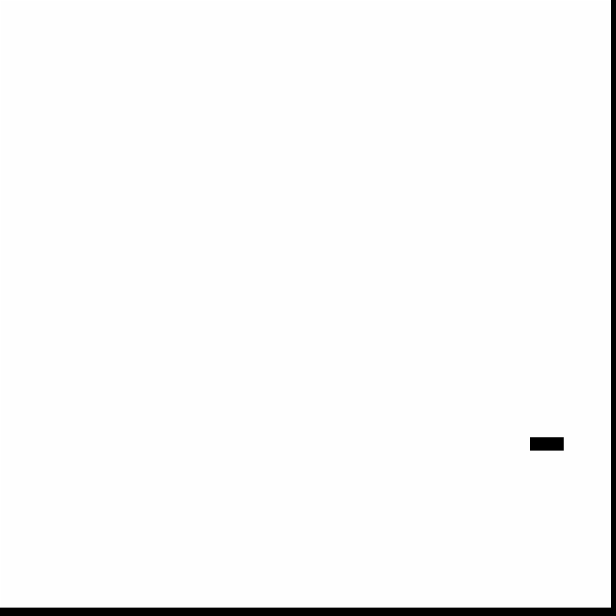

[Series 8: carotid care 2 · 2 acquisitions, 1 frame shown (4 of 8)]
[im 1/2]
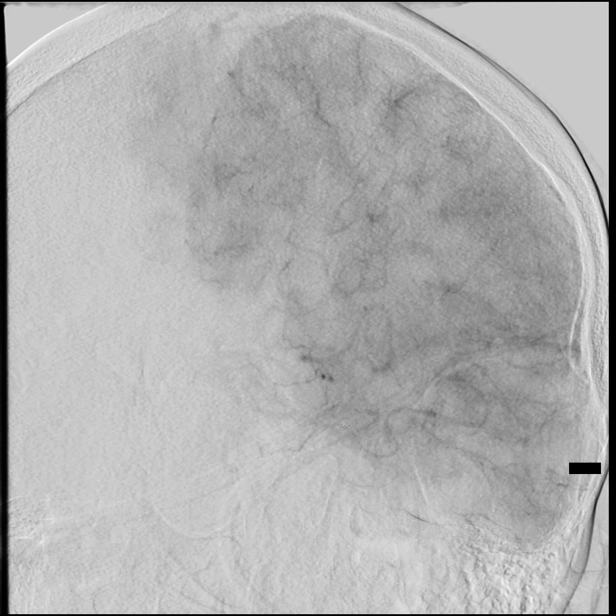

[Series 10: carotid care 2 · 2 acquisitions, 1 frame shown (5 of 8)]
[im 1/2]
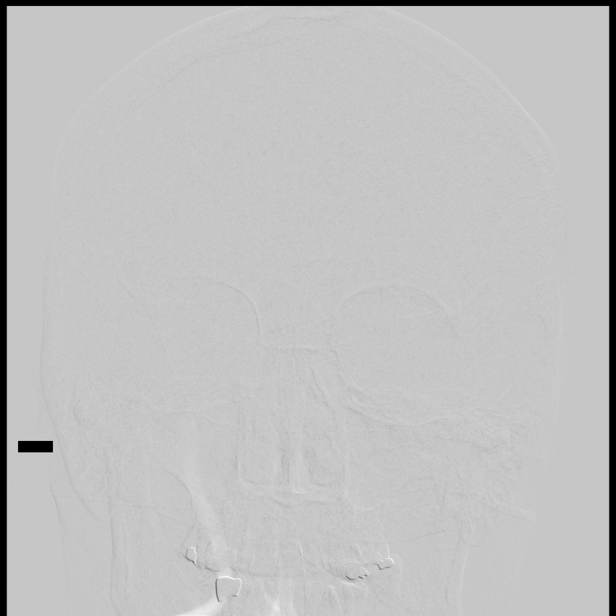

[Series 11: carotid care 2 · 2 acquisitions, 1 frame shown (6 of 8)]
[im 1/2]
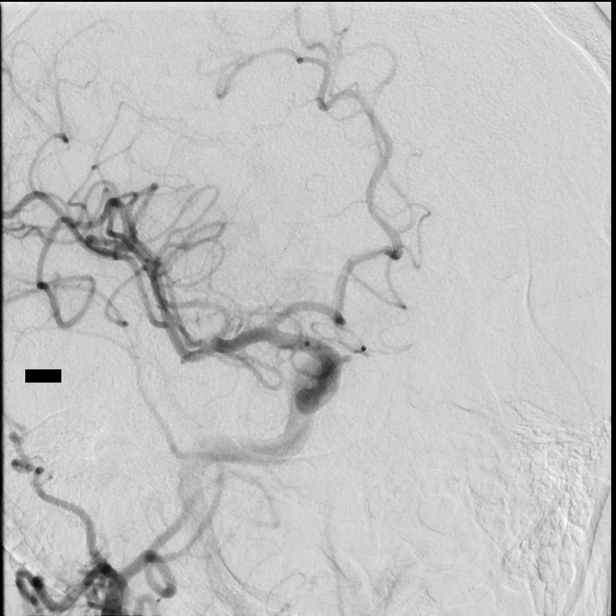

[Series 12: carotid care 2 · 2 acquisitions, 1 frame shown (7 of 8)]
[im 1/2]
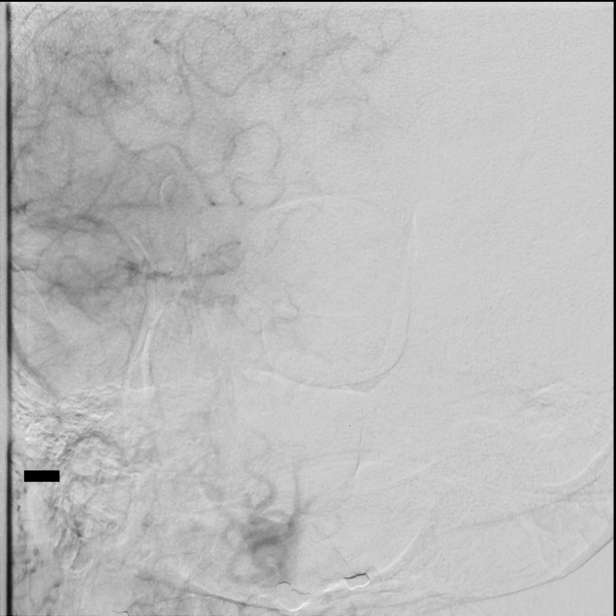

[Series 13: carotid care 2 · 2 acquisitions, 1 frame shown (8 of 8)]
[im 1/2]
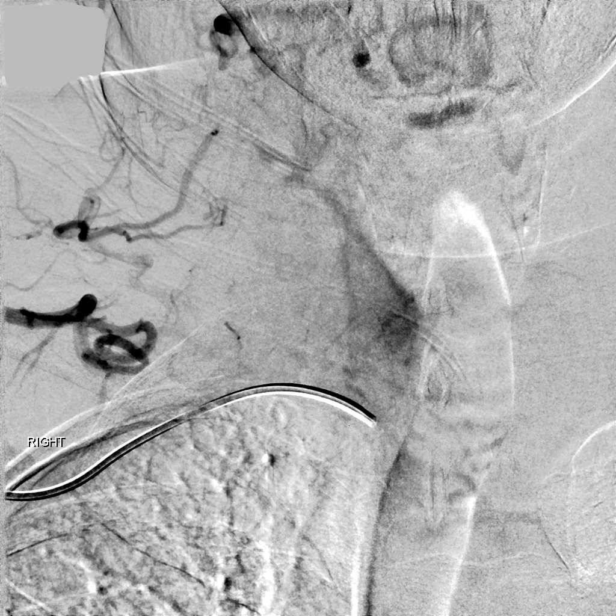

[Series 300: dr. (person_name) · 1 of 21 slices shown (1 of 2)]
[im 11/21]
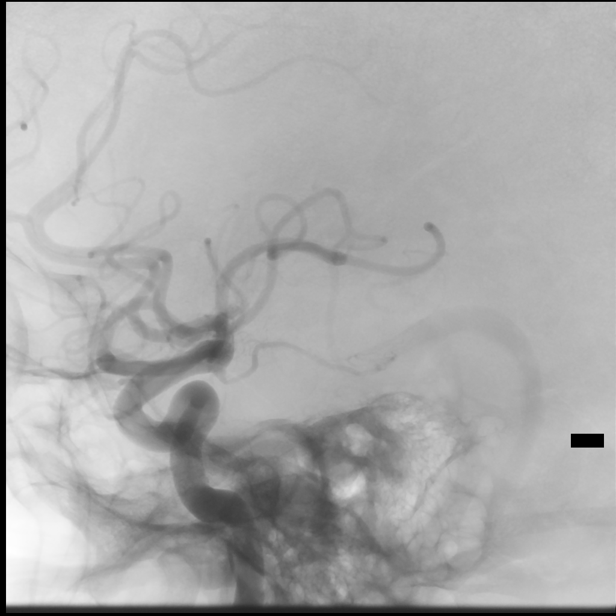

[Series 300: dr. (person_name) · 1 of 2 slices shown (2 of 2)]
[im 1/2]
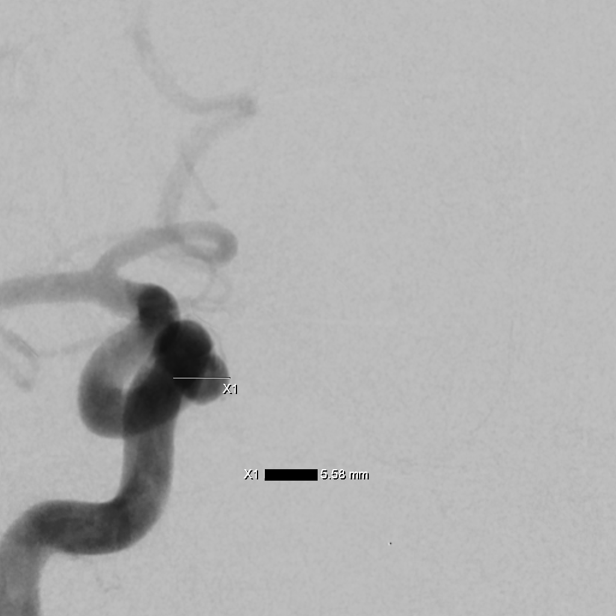

[12 of 24 positions shown; findings below may reference images not displayed]

MEDICATIONS:
5,000 BUSCH heparin, 5 mg Verapamil and 200 mcg nitroglycerin

ANESTHESIA/SEDATION:
Versed 0.5 mg IV; Fentanyl 25 mcg IV

Moderate Sedation Time:  48

The patient was continuously monitored during the procedure by the
interventional radiology nurse under my direct supervision.

CONTRAST:  80 mL of Omnipaque 240 milligram/mL

FLUOROSCOPY TIME:  Fluoroscopy Time: 50 minutes (317 mGy).

COMPLICATIONS:
None immediate.
Maximal Sterile Barrier Technique was utilized including caps, mask,
sterile gowns, sterile gloves, sterile drape, hand hygiene and skin
antiseptic. A timeout was performed prior to the initiation of the
procedure.

Using the modified Seldinger technique and a micropuncture kit,
access was gained to the right radial artery at the wrist and a 5
French sheath was placed. Real-time ultrasound guidance was utilized
for vascular access including the acquisition of a permanent
ultrasound image documenting patency of the accessed vessel. Slow
intra arterial infusion of 5,000 BUSCH heparin, 5 mg Verapamil and 200
mcg nitroglycerin diluted in patient's own blood was performed. No
significant fluctuation in patient's blood pressure seen. Then, a
right radial artery angiogram was obtained via sheath side port.
Normal brachial artery branching pattern seen. No significant
anatomical variation. The right radial artery caliber is adequate
for vascular access.

Next, a 5 BUSCH 2 catheter was navigated over a 0.035"
Terumo Glidewire into the right subclavian artery under fluoroscopic
guidance.

The catheter was then navigated into the aortic arch where the
catheter tip was reformed. Then, the catheter was advanced into the
left subclavian artery and then into the left vertebral artery.
Frontal and lateral angiograms of the head were obtained.

The catheter was then placed in the left common carotid artery.
Frontal and lateral angiograms of the head were obtained followed by
magnified left anterior oblique, magnified right anterior oblique
and magnified lateral views.

The catheter was subsequently placed into the right common carotid
artery, frontal and lateral views of the neck were obtained. Then,
frontal, lateral, magnified right anterior oblique, magnified
lateral and magnified left anterior oblique views of the head
obtained.

The catheter was subsequently retracted into the right subclavian
artery. Frontal and lateral angiograms of the neck were obtained.

The catheter was subsequently withdrawn over the wire.

An inflatable band was placed and inflated over the right wrist
access site. The vascular sheath was withdrawn and the band was
slowly deflated until brisk flow was noted through the arteriotomy
site. At this point, the band was reinflated with additional 2 cc of
air to obtain patent hemostasis.
FINDINGS: Right radial artery ultrasound and right radial artery angiogram:
The caliber of the distal right radial artery is appropriate for
angiogram access. The right radial artery and the right ulnar artery
have normal course and caliber. No significant anatomical variants
noted.

Left vertebral artery angiograms: The dominant left vertebral
artery, basilar artery, and bilateral posterior cerebral arteries
are unremarkable. Luminal caliber is smooth and tapering. No
aneurysms or abnormally high-flow, early draining veins are seen. No
regions of abnormal hypervascularity are noted. The visualized dural
sinuses are patent.

Left CCA angiograms: Cervical angiograms show normal course and
caliber of the visualized left common carotid and internal carotid
arteries. There are no significant stenoses.

Left ICA angiograms: There is brisk vascular contrast filling of the
left ACA and MCA vascular trees. Brisk contrast opacification of the
right ACA vascular tree is also seen via a prominent anterior
communicating artery. Luminal caliber is smooth and tapering. No
aneurysms or abnormally high-flow, early draining veins are seen. No
regions of abnormal hypervascularity are noted. The visualized dural
sinuses are patent.

Right CCA angiograms: Cervical angiograms show normal course and
caliber of the visualized right common carotid and internal carotid
arteries. There are no significant stenoses.

Right CCA-cranial views angiograms: Immediately projecting saccular
aneurysm of the petro-cavernous segment of the right ICA measuring
approximately 5.6 x 5.5 x 5.2 mm. There is brisk vascular contrast
filling of the right ACA and MCA vascular trees. Luminal caliber is
smooth and tapering. No aneurysms or abnormally high-flow, early
draining veins are seen. No regions of abnormal hypervascularity are
noted. The visualized dural sinuses are patent. The visualized
branches of the right external carotid artery are unremarkable.

Right subclavian angiograms: The right subclavian artery and
visualized branches of the thyrocervical trunk are unremarkable.
Hypoplastic right vertebral artery is noted.

PROCEDURE:
No intervention performed.
IMPRESSION: 1. A 5.6 mm aneurysm of the petrous cavernous segment of the right
ICA.
2. Hypoplastic right vertebral artery.

PLAN:
Patient will return for consultation to discuss aneurysm management.

## 2021-02-14 MED ORDER — IOHEXOL 240 MG/ML SOLN
50.0000 mL | Freq: Once | INTRAMUSCULAR | Status: AC | PRN
  Administered 2021-02-14: 30 mL via INTRA_ARTERIAL

## 2021-02-14 MED ORDER — FENTANYL CITRATE (PF) 100 MCG/2ML IJ SOLN
INTRAMUSCULAR | Status: DC | PRN
  Administered 2021-02-14: 25 ug via INTRAVENOUS

## 2021-02-14 MED ORDER — IOHEXOL 240 MG/ML SOLN
50.0000 mL | Freq: Once | INTRAMUSCULAR | Status: AC | PRN
  Administered 2021-02-14: 20 mL via INTRA_ARTERIAL

## 2021-02-14 MED ORDER — NITROGLYCERIN 1 MG/10 ML FOR IR/CATH LAB
INTRA_ARTERIAL | Status: DC | PRN
  Administered 2021-02-14: 200 ug

## 2021-02-14 MED ORDER — HEPARIN SODIUM (PORCINE) 1000 UNIT/ML IJ SOLN
INTRAMUSCULAR | Status: AC
  Filled 2021-02-14: qty 1

## 2021-02-14 MED ORDER — MIDAZOLAM HCL 2 MG/2ML IJ SOLN
INTRAMUSCULAR | Status: AC
  Filled 2021-02-14: qty 2

## 2021-02-14 MED ORDER — VERAPAMIL HCL 2.5 MG/ML IV SOLN
INTRAVENOUS | Status: DC | PRN
  Administered 2021-02-14: 5 mg via INTRA_ARTERIAL

## 2021-02-14 MED ORDER — NITROGLYCERIN 1 MG/10 ML FOR IR/CATH LAB
INTRA_ARTERIAL | Status: AC
  Filled 2021-02-14: qty 10

## 2021-02-14 MED ORDER — VERAPAMIL HCL 2.5 MG/ML IV SOLN
INTRAVENOUS | Status: AC
  Filled 2021-02-14: qty 2

## 2021-02-14 MED ORDER — HEPARIN SODIUM (PORCINE) 1000 UNIT/ML IJ SOLN
INTRAMUSCULAR | Status: DC | PRN
  Administered 2021-02-14: 5000 [IU] via INTRAVENOUS

## 2021-02-14 MED ORDER — LIDOCAINE HCL 1 % IJ SOLN
INTRAMUSCULAR | Status: AC
  Filled 2021-02-14: qty 20

## 2021-02-14 MED ORDER — FENTANYL CITRATE (PF) 100 MCG/2ML IJ SOLN
INTRAMUSCULAR | Status: AC
  Filled 2021-02-14: qty 2

## 2021-02-14 MED ORDER — SODIUM CHLORIDE 0.9 % IV SOLN: INTRAVENOUS | Status: DC

## 2021-02-14 MED ORDER — MIDAZOLAM HCL 2 MG/2ML IJ SOLN
INTRAMUSCULAR | Status: DC | PRN
  Administered 2021-02-14: .5 mg via INTRAVENOUS

## 2021-02-14 NOTE — Sedation Documentation (Signed)
BP parameters every 2 min while giving radial cocktail. 110/72

## 2021-02-14 NOTE — Procedures (Signed)
INTERVENTIONAL NEURORADIOLOGY BRIEF POSTPROCEDURE NOTE   Diagnostic cerebral angiogram    Attending: Dr. Pedro Earls   Assistant: None.    Diagnosis: Right ICA aneurysm    Access site: Right radial artery    Access closure: TR band    Anesthesia: Moderate sedation    Medication used: 0.5 mg Versed IV; 25 mcg Fentanyl IV.   Complications: None    Estimated blood loss: Negligible    Specimen: None    Findings: A 5 mm petrous cavernous ICA aneurysm.  No other aneurysm identified.    The patient tolerated the procedure well without incident or complication and is in stable condition.

## 2021-02-15 ENCOUNTER — Emergency Department (HOSPITAL_BASED_OUTPATIENT_CLINIC_OR_DEPARTMENT_OTHER): Payer: Medicare HMO

## 2021-02-15 ENCOUNTER — Other Ambulatory Visit: Payer: Self-pay

## 2021-02-15 ENCOUNTER — Emergency Department (HOSPITAL_COMMUNITY): Payer: Medicare HMO

## 2021-02-15 ENCOUNTER — Emergency Department (HOSPITAL_COMMUNITY)
Admission: EM | Admit: 2021-02-15 | Discharge: 2021-02-15 | Disposition: A | Discharge status: Home / Self Care | Payer: Medicare HMO | Attending: Emergency Medicine | Admitting: Emergency Medicine

## 2021-02-15 DIAGNOSIS — M25531 Pain in right wrist: Secondary | ICD-10-CM | POA: Diagnosis not present

## 2021-02-15 DIAGNOSIS — M7989 Other specified soft tissue disorders: Secondary | ICD-10-CM | POA: Diagnosis not present

## 2021-02-15 DIAGNOSIS — R930 Abnormal findings on diagnostic imaging of skull and head, not elsewhere classified: Secondary | ICD-10-CM | POA: Diagnosis not present

## 2021-02-15 DIAGNOSIS — I10 Essential (primary) hypertension: Secondary | ICD-10-CM | POA: Diagnosis not present

## 2021-02-15 DIAGNOSIS — R410 Disorientation, unspecified: Secondary | ICD-10-CM | POA: Insufficient documentation

## 2021-02-15 DIAGNOSIS — M79601 Pain in right arm: Secondary | ICD-10-CM | POA: Diagnosis not present

## 2021-02-15 DIAGNOSIS — Z87891 Personal history of nicotine dependence: Secondary | ICD-10-CM | POA: Insufficient documentation

## 2021-02-15 IMAGING — CT CT HEAD W/O CM
4 series · 15 of 47 positions shown, 17 images · non-contrast
Comparison: Brain MRI [DATE]

CLINICAL DATA: [facility]ium

EXAM:
CT HEAD WITHOUT CONTRAST
TECHNIQUE: Contiguous axial images were obtained from the base of the skull
through the vertex without intravenous contrast.

[Series 3: head wo · axial · 0.41mm/px · z∈[-218,-98]mm · 7 of 32 slices shown, 9 images]
[im 4/32  brain]
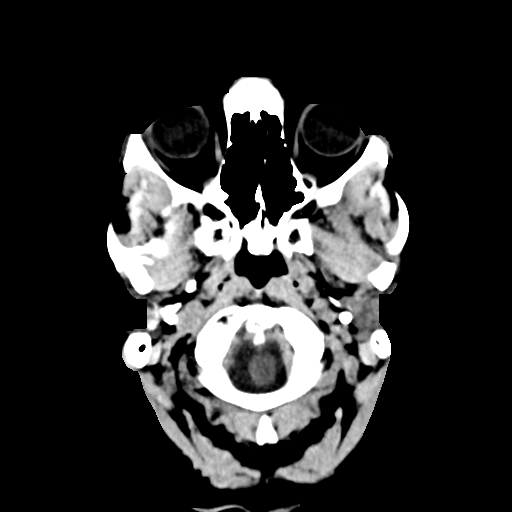
[im 4/32  bone]
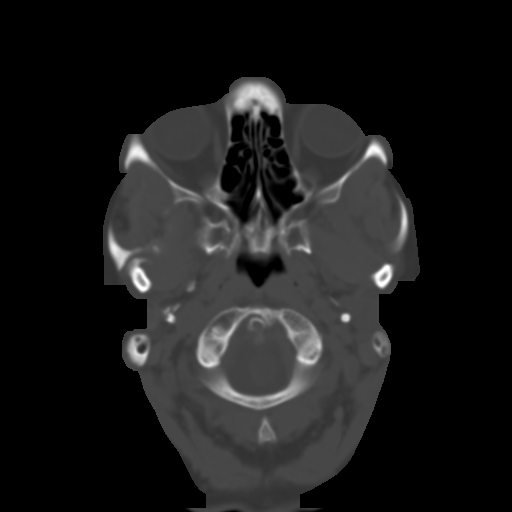
[im 8/32  brain]
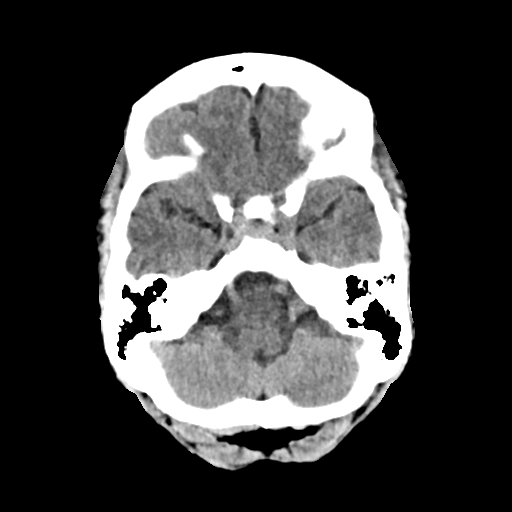
[im 12/32  brain]
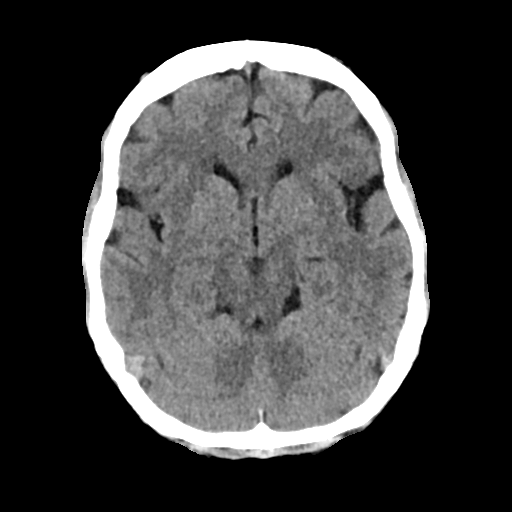
[im 16/32  brain]
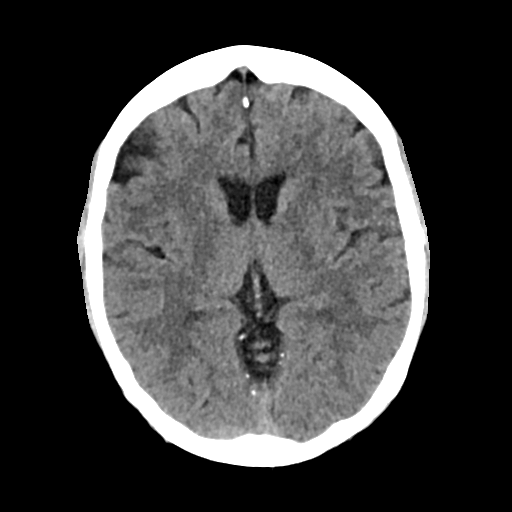
[im 20/32  brain]
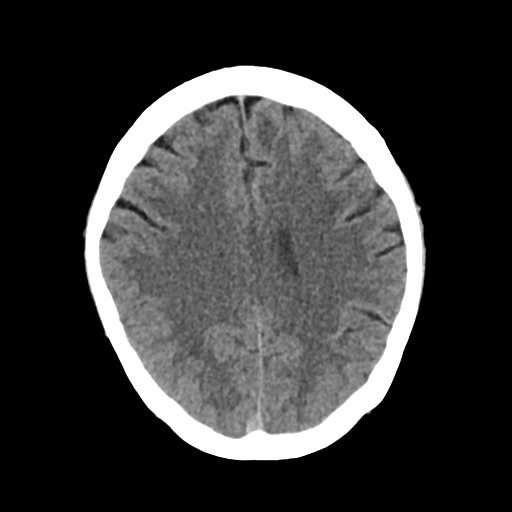
[im 20/32  bone]
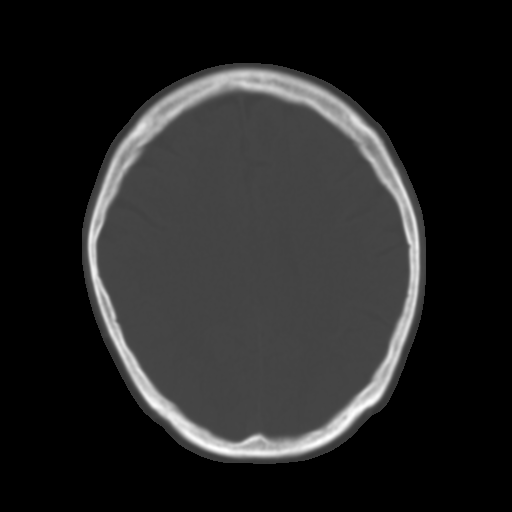
[im 24/32  brain]
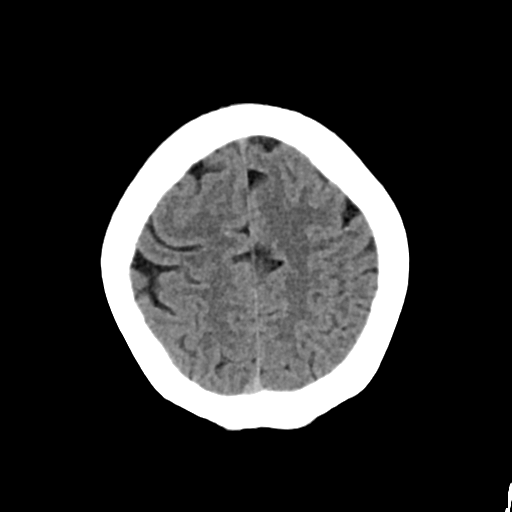
[im 28/32  brain]
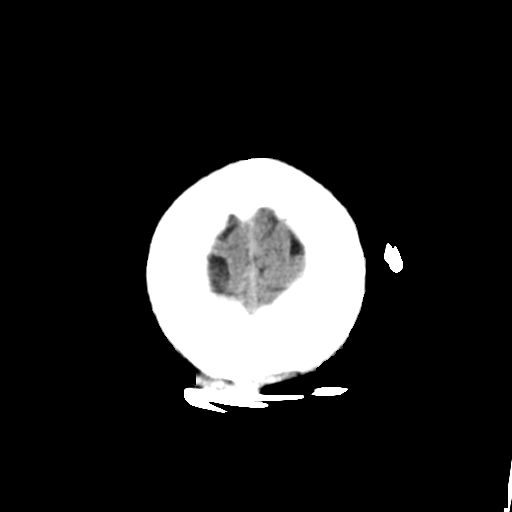

[Series 4: head bone · axial · 0.41mm/px · z∈[-218,-202]mm · 2 of 80 slices shown]
[im 8/80  bone]
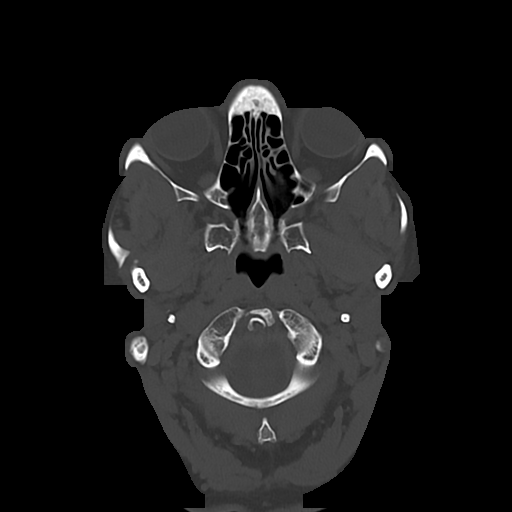
[im 16/80  bone]
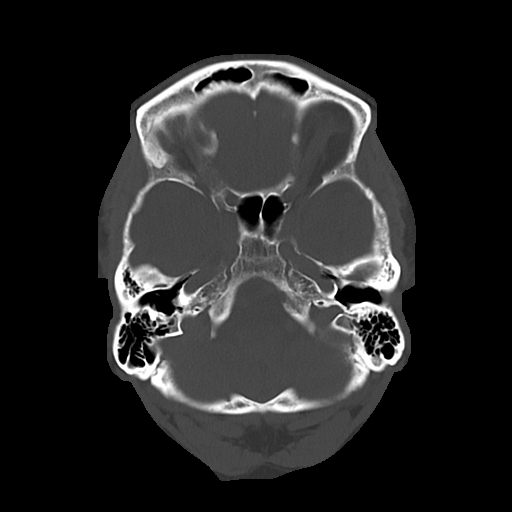

[Series 5: cor soft · coronal · 0.30mm/px · 3 of 68 slices shown]
[im 23/68  brain]
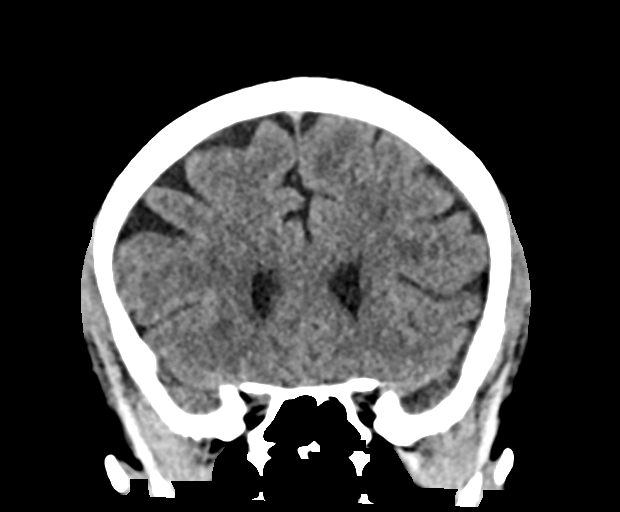
[im 30/68  brain]
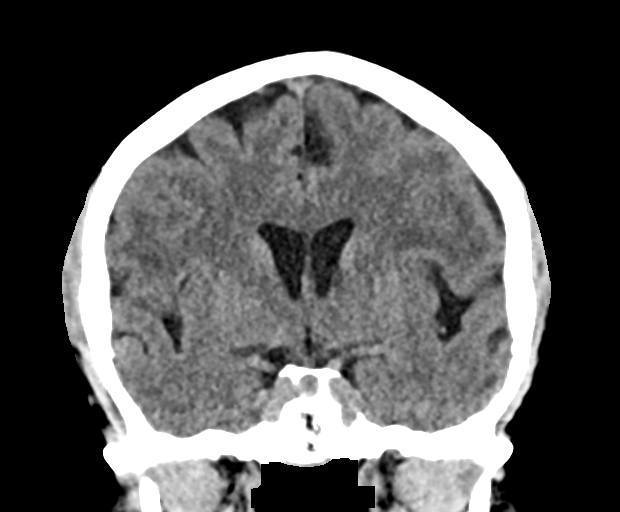
[im 38/68  brain]
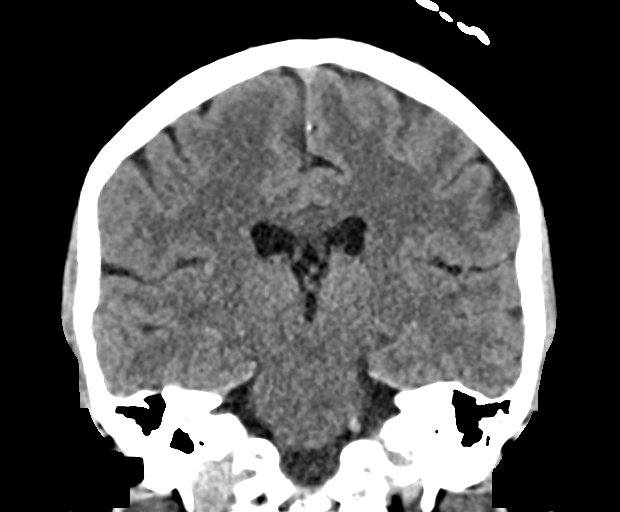

[Series 6: sag soft · sagittal · 0.29mm/px · 3 of 62 slices shown]
[im 21/62  brain]
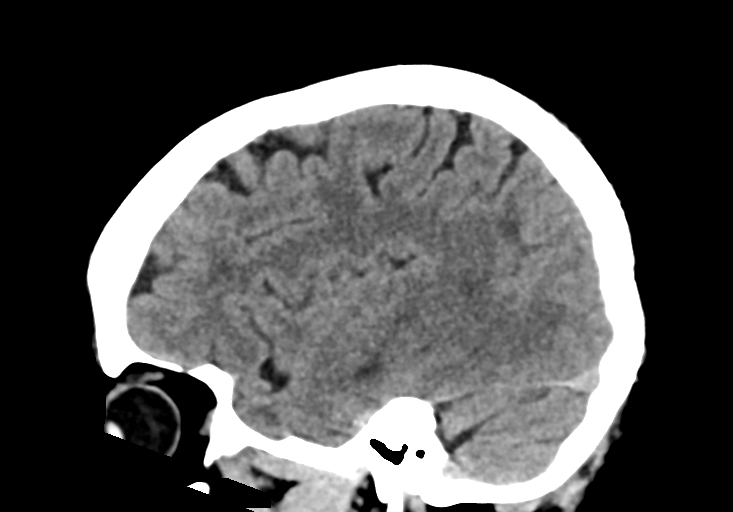
[im 31/62  brain]
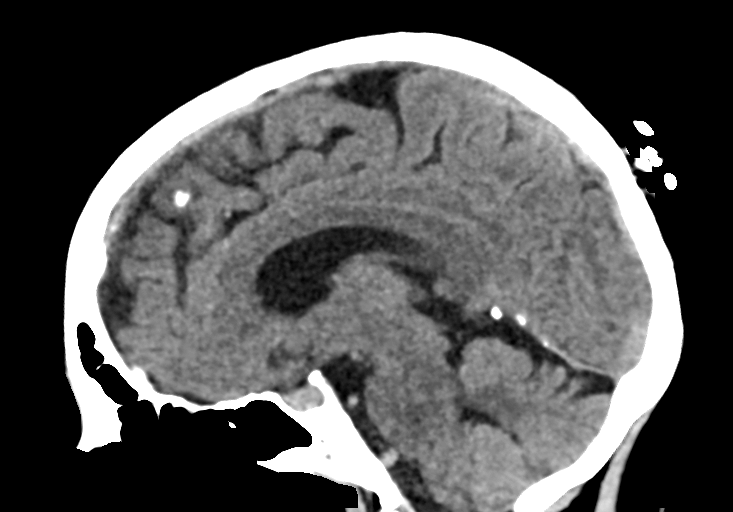
[im 41/62  brain]
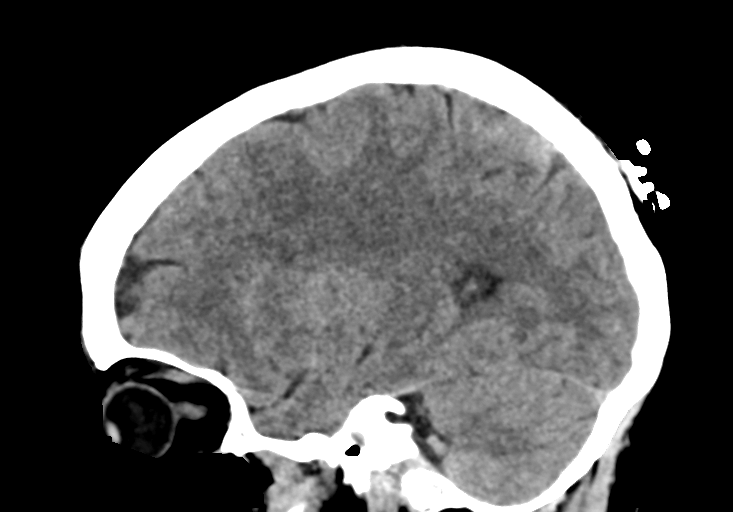

[15 of 47 positions shown; findings below may reference images not displayed]

FINDINGS: Brain: No evidence of acute infarction, hemorrhage, hydrocephalus,
extra-axial collection or mass lesion/mass effect. Multifocal
regions of white matter hypointensity are noted, the most prominent
within the might matter of the anterior limb of the left basal
ganglia adjacent to the head of the caudate. Correlation with prior
MRI imaging demonstrates a similar pattern of FLAIR signal
abnormality in these regions.

Vascular: No hyperdense vessel or unexpected calcification.

Skull: Normal. Negative for fracture or focal lesion.

Sinuses/Orbits: No acute finding.

Other: None.
IMPRESSION: 1. No acute intracranial abnormality.
2. Stable pattern of white matter foci of hypoattenuation which
remain nonspecific but suggestive of age advanced chronic right
matter angiopathy.

## 2021-02-15 NOTE — Progress Notes (Signed)
VASCULAR LAB    Right upper extremity arterial duplex has been performed.  See CV proc for preliminary results.  Messaged results to Karolee Stamps, RN, and Silverio Decamp, PA-C via secure chat.  Jahmar Mckelvy, RVT 02/15/2021, 4:03 PM

## 2021-02-15 NOTE — ED Triage Notes (Addendum)
Patient arrives with complaints of shooting/prickly pain in right arm. Per her sister, patient had a cerebral angiogram procedure yesterday for an aneurysm (insertion site at right wrist) and started having pain at site this morning. Patient still has dressing to site (clean, dry, intact). Took 2-'500mg'$  Tylenol pills at 0200 with minimal relief.  Rates pain a 2/10.   Some swelling in left hand, but patient does have sensation and movement.

## 2021-02-15 NOTE — Discharge Instructions (Signed)
You were seen in the emergency room today with wrist pain.  The wrist ultrasound does not show injury to the blood vessel in your wrist.  Please look for signs such as color change in the hand, swelling at the wrist, tingling/numbness in the fingers.  If these develop, please return to the emergency department for reevaluation.  Please keep your follow-up appointment with the neuroradiologist.

## 2021-02-15 NOTE — ED Provider Notes (Signed)
Emergency Department Provider Note   I have reviewed the triage vital signs and the nursing notes.   HISTORY  Chief Complaint Arm Pain (Right)   HPI Kimberly Conway is a 65 y.o. female presents emergency department for evaluation of shooting pain in the right arm after intravascular procedure at that site yesterday.  Patient had cerebral angiogram yesterday without complication.  She began having some prickly/shooting pain at the site.  No swelling or drainage at the injection site.  No fevers or chills.  She is been taking Tylenol with only minimal relief.  Notes some mild swelling in the arm but nothing severe.  No chest pain or shortness of breath symptoms.  No radiation or modifying factors.   Past Medical History:  Diagnosis Date   Depression    GAD (generalized anxiety disorder)    GERD (gastroesophageal reflux disease)    HTN (hypertension)    Multiple thyroid nodules    Other atopic dermatitis 08/17/2020   Seasonal allergies    Urticaria    Vertigo     Patient Active Problem List   Diagnosis Date Noted   Allergic contact dermatitis 08/17/2020   Other atopic dermatitis 08/17/2020   Other allergic rhinitis 08/17/2020   Depression    HTN (hypertension)    Multiple thyroid nodules    Anxiety and depression 01/02/2019   Gastroesophageal reflux disease without esophagitis 01/02/2019   Seasonal allergies 01/02/2019    Past Surgical History:  Procedure Laterality Date   APPENDECTOMY     IR ANGIO INTRA EXTRACRAN SEL COM CAROTID INNOMINATE UNI R MOD SED  02/14/2021   IR ANGIO INTRA EXTRACRAN SEL INTERNAL CAROTID UNI L MOD SED  02/14/2021   IR ANGIO VERTEBRAL SEL SUBCLAVIAN INNOMINATE UNI R MOD SED  02/14/2021   IR ANGIO VERTEBRAL SEL VERTEBRAL UNI L MOD SED  02/14/2021   IR US GUIDE VASC ACCESS RIGHT  02/14/2021    Allergies Patient has no known allergies.  Family History  Problem Relation Age of Onset   Diabetes Mother    Breast cancer Mother    CAD Father     Hypertension Sister    Breast cancer Sister    Migraines Sister    Migraines Sister    Diabetes Brother    Colon cancer Neg Hx    Esophageal cancer Neg Hx    Pancreatic cancer Neg Hx    Stomach cancer Neg Hx     Social History Social History   Tobacco Use   Smoking status: Former    Types: Cigarettes    Quit date: 06/21/2014    Years since quitting: 6.6   Smokeless tobacco: Never  Vaping Use   Vaping Use: Never used  Substance Use Topics   Alcohol use: Not Currently   Drug use: Never    Review of Systems  Constitutional: No fever/chills Eyes: No visual changes. ENT: No sore throat. Cardiovascular: Denies chest pain. Respiratory: Denies shortness of breath. Gastrointestinal: No abdominal pain.  No nausea, no vomiting.  No diarrhea.  No constipation. Genitourinary: Negative for dysuria. Musculoskeletal: Negative for back pain. Positive right arm pain.  Skin: Negative for rash. Neurological: Negative for headaches, focal weakness or numbness.  10-point ROS otherwise negative.  ____________________________________________   PHYSICAL EXAM:  VITAL SIGNS: ED Triage Vitals  Enc Vitals Group     BP 02/15/21 1239 127/83     Pulse Rate 02/15/21 1239 (!) 57     Resp 02/15/21 1239 16     Temp  02/15/21 1239 98.4 F (36.9 C)     Temp Source 02/15/21 1239 Oral     SpO2 02/15/21 1239 99 %     Weight 02/15/21 1357 150 lb (68 kg)     Height 02/15/21 1357 '5\' 2"'$  (1.575 m)    Constitutional: Alert and oriented. Well appearing and in no acute distress. Eyes: Conjunctivae are normal.  Head: Atraumatic. Nose: No congestion/rhinnorhea. Mouth/Throat: Mucous membranes are moist.   Neck: No stridor.  Cardiovascular: Normal rate, regular rhythm. Good peripheral circulation. Grossly normal heart sounds.  Normal pulses in the right wrist.  Arterial puncture site is well-appearing with no erythema, swelling, warmth.  Respiratory: Normal respiratory effort.  No retractions. Lungs  CTAB. Gastrointestinal: Soft and nontender. No distention.  Musculoskeletal: No lower extremity tenderness nor edema. No gross deformities of extremities. No appreciable arm swelling.  Neurologic:  Normal speech and language. No gross focal neurologic deficits are appreciated.  Skin:  Skin is warm, dry and intact. No rash noted.  ____________________________________________  RADIOLOGY  CT HEAD WO CONTRAST (5MM)  Result Date: 02/15/2021 CLINICAL DATA:  [facility] EXAM: CT HEAD WITHOUT CONTRAST TECHNIQUE: Contiguous axial images were obtained from the base of the skull through the vertex without intravenous contrast. COMPARISON:  Brain MRI 01/23/2021 FINDINGS: Brain: No evidence of acute infarction, hemorrhage, hydrocephalus, extra-axial collection or mass lesion/mass effect. Multifocal regions of white matter hypointensity are noted, the most prominent within the might matter of the anterior limb of the left basal ganglia adjacent to the head of the caudate. Correlation with prior MRI imaging demonstrates a similar pattern of FLAIR signal abnormality in these regions. Vascular: No hyperdense vessel or unexpected calcification. Skull: Normal. Negative for fracture or focal lesion. Sinuses/Orbits: No acute finding. Other: None. IMPRESSION: 1. No acute intracranial abnormality. 2. 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 Electronically Signed   By: Jacqulynn Cadet M.D.   On: 02/15/2021 15:26    ____________________________________________   PROCEDURES  Procedure(s) performed:   Procedures  None  ____________________________________________   INITIAL IMPRESSION / ASSESSMENT AND PLAN / ED COURSE  Pertinent labs & imaging results that were available during my care of the patient were reviewed by me and considered in my medical decision making (see chart for details).   Patient presents  emergency department with right arm pain after procedure yesterday.  During the MSE process arterial ultrasound as well as CT head was ordered during the MSE process.  CT shows no acute findings.  Patient's mental status question by family.  Some mild confusion noted but suspect this is likely due to anesthesia from yesterday.  But she has no fever, UTI symptoms.  She is ANO x4 with me here and following commands.  Neurologic exam is normal.  Ultrasound of the right wrist shows no pseudoaneurysm or other vascular complication.  Plan for continued supportive care at home and patient to keep her outpatient follow-up plan. Discussed ED return precautions.    ____________________________________________  FINAL CLINICAL IMPRESSION(S) / ED DIAGNOSES  Final diagnoses:  Right arm pain      Note:  This document was prepared using Dragon voice recognition software and may include unintentional dictation errors.  Nanda Quinton, MD, [facility]    Mosella Kasa, [facility], MD 02/19/21 [phone]

## 2021-02-15 NOTE — ED Provider Notes (Signed)
Emergency Medicine Provider Triage Evaluation Note  Kimberly Conway , a 65 y.o. female  was evaluated in triage.  Pt complains of right upper extremity pain at access site for yesterday cerebral angiogram, history of tingling in the hand this morning now resolved.  Patient with history of cerebral  aneurysm, 5.6 mm with petrous cavernous segment of the right ICA is also hypoplastic right vertebral artery.  Some confusion today according to her sister who is at the bedside.  Review of Systems  Positive: Right upper extremity pain at site of access point for angiogram yesterday, confusion Negative: Unilateral weakness, facial droop, difficulty speaking difficulty walking  Physical Exam  BP 127/83   Pulse (!) 57   Temp 98.4 F (36.9 C) (Oral)   Resp 16   Ht '5\' 2"'$  (1.575 m)   Wt 68 kg   SpO2 99%   BMI 27.44 kg/m  Gen:   Awake, no distress   Resp:  Normal effort  MSK:   Moves extremities without difficulty  Other:  Sterile dressing over the access site for right radial artery, diminished pulses in the right relative to the left radial artery.  Normal cap refill, sensation, and range of motion of all 5 digits of the right hand.  Neurologic exam without focal deficit  Medical Decision Making  Medically screening exam initiated at 2:08 PM.  Appropriate orders placed.  Demyah A Winning was informed that the remainder of the evaluation will be completed by another provider, this initial triage assessment does not replace that evaluation, and the importance of remaining in the ED until their evaluation is complete.  This chart was dictated using voice recognition software, Dragon. Despite the best efforts of this provider to proofread and correct errors, errors may still occur which can change documentation meaning.    Emeline Darling, PA-C 02/15/21 1417    Margette Fast, MD 02/23/21 1752

## 2021-02-18 ENCOUNTER — Other Ambulatory Visit (HOSPITAL_COMMUNITY): Payer: Self-pay | Admitting: Neuroradiology

## 2021-02-18 DIAGNOSIS — I671 Cerebral aneurysm, nonruptured: Secondary | ICD-10-CM

## 2021-02-21 ENCOUNTER — Other Ambulatory Visit: Payer: Self-pay

## 2021-02-21 ENCOUNTER — Ambulatory Visit (HOSPITAL_COMMUNITY)
Admission: RE | Admit: 2021-02-21 | Discharge: 2021-02-21 | Disposition: A | Discharge status: Home / Self Care | Payer: Medicare HMO | Source: Ambulatory Visit | Attending: Neuroradiology | Admitting: Neuroradiology

## 2021-02-21 DIAGNOSIS — I671 Cerebral aneurysm, nonruptured: Secondary | ICD-10-CM | POA: Diagnosis not present

## 2021-02-21 NOTE — Progress Notes (Signed)
Referring Physician(s): de Macedo Rodrigues,Vernica Wachtel  Chief Complaint: The patient is seen in follow up today s/p diagnostic cerebral angiogram  History of present illness:  Kimberly Conway is a 65 y.o. female with a past medical history significant for depression and vertigo who presented to the emergency department due to a 5 day history of worsening dizziness, imbalance and fogginess.  She underwent an MRI/MRA of the head due to concerns for posterior circulation stroke.  The study was negative for stroke, however, MR angiogram revealed a 6 mm cavernous right ICA aneurysm and hypoplasia versus stenosis of the right vertebral artery.  She does not have a family history of brain aneurysm.  She does refer former history of smoking (40-50 pack/year) having quit approximately 7 years ago. 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 She comes today to discuss angiogram findings and aneurysm management.  Past Medical History:  Diagnosis Date   Depression    GAD (generalized anxiety disorder)    GERD (gastroesophageal reflux disease)    HTN (hypertension)    Multiple thyroid nodules    Other atopic dermatitis 08/17/2020   Seasonal allergies    Urticaria    Vertigo     Past Surgical History:  Procedure Laterality Date   APPENDECTOMY     IR ANGIO INTRA EXTRACRAN SEL COM CAROTID INNOMINATE UNI R MOD SED  02/14/2021   IR ANGIO INTRA EXTRACRAN SEL INTERNAL CAROTID UNI L MOD SED  02/14/2021   IR ANGIO VERTEBRAL SEL SUBCLAVIAN INNOMINATE UNI R MOD SED  02/14/2021   IR ANGIO VERTEBRAL SEL VERTEBRAL UNI L MOD SED  02/14/2021   IR US GUIDE VASC ACCESS RIGHT  02/14/2021    Allergies: Patient has no known allergies.  Medications: Prior to Admission medications   Medication Sig Start Date End Date Taking? Authorizing Provider  aspirin EC 81 MG tablet Take 81 mg by mouth daily. Swallow  whole.    [provider]  b complex vitamins tablet Take 1 tablet by mouth daily.    [provider]  Calcium Citrate (CITRACAL PO) Take 1,200 mg by mouth in the morning and at bedtime.    [provider]  desvenlafaxine (PRISTIQ) 50 MG 24 hr tablet Take 1 tablet (50 mg total) by mouth daily. 12/27/20   Mozingo, Berdie Ogren, NP  fluticasone [facility]) 50 MCG/ACT nasal spray USE 2 SPRAYS IN [facility] NOSTRIL EVERY DAY 12/11/20   Isaac Bliss, Rayford Halsted, MD  glucosamine-chondroitin 500-400 MG tablet Take 1 tablet by mouth daily.    [provider]  levocetirizine (XYZAL) 5 MG tablet TAKE 1 TABLET (5 MG TOTAL) BY MOUTH EVERY EVENING. 10/04/20   Isaac Bliss, Rayford Halsted, MD  LORazepam (ATIVAN) 0.5 MG tablet Take 1 tablet (0.5 mg total) by mouth daily as needed. 02/05/20   Mozingo, Berdie Ogren, NP  meclizine (ANTIVERT) 25 MG tablet Take 1 tablet (25 mg total) by mouth 3 (three) times daily as needed for dizziness. 01/23/21   Milton Ferguson, MD  metoprolol succinate (TOPROL-XL) 25 MG 24 hr tablet Take 12.5 mg by mouth at bedtime. Take half tablet daily 01/10/18   [provider]  Multiple Vitamin (MULTIVITAMIN) tablet Take 1 tablet by mouth daily.    [provider]  Omega-3 Fatty Acids (OMEGA-3 FISH OIL PO) Take 2 capsules by mouth daily.    [provider]  omeprazole (PRILOSEC) 20 MG capsule Take  1 capsule (20 mg total) by mouth 2 (two) times daily before a meal. 10/08/20   Isaac Bliss, Rayford Halsted, MD  Probiotic Product (PROBIOTIC-10 PO) Take 1 capsule by mouth daily.    [provider]  Turmeric 500 MG CAPS Take 500 mg by mouth daily.    [provider]     Family History  Problem Relation Age of Onset   Diabetes Mother    Breast cancer Mother    CAD Father    Hypertension Sister    Breast cancer Sister    Migraines Sister    Migraines Sister    Diabetes Brother    Colon cancer Neg Hx    Esophageal cancer Neg  Hx    Pancreatic cancer Neg Hx    Stomach cancer Neg Hx     Social History   Socioeconomic History   Marital status: Single    Spouse name: Not on file   Number of children: Not on file   Years of education: Not on file   Highest education level: Not on file  Occupational History   Not on file  Tobacco Use   Smoking status: Former    Types: Cigarettes    Quit date: 06/21/2014    Years since quitting: 6.6   Smokeless tobacco: Never  Vaping Use   Vaping Use: Never used  Substance and Sexual Activity   Alcohol use: Not Currently   Drug use: Never   Sexual activity: Not on file  Other Topics Concern   Not on file  Social History Narrative   Right handed   Social Determinants of Health   Financial Resource Strain: Not on file  Food Insecurity: Not on file  Transportation Needs: Not on file  Physical Activity: Not on file  Stress: Not on file  Social Connections: Not on file     Vital Signs: There were no vitals taken for this visit.  Physical Exam  Imaging: No results found.  Labs:  CBC: Recent Labs    01/23/21 1225 02/14/21 0740  WBC 4.9 5.6  HGB 14.6 14.5  HCT 43.2 43.8  PLT 299 297    COAGS: Recent Labs    02/14/21 0740  INR 0.9    BMP: Recent Labs    01/23/21 1225 02/14/21 0740  NA 138 137  K 4.5 4.2  CL 106 103  CO2 24 26  GLUCOSE 98 91  BUN 12 11  CALCIUM 9.4 9.0  CREATININE 0.71 0.89  GFRNONAA >60 >60    LIVER FUNCTION TESTS: Recent Labs    05/01/20 0000 01/23/21 1225  BILITOT  --  1.0  AST  --  32  ALT  --  28  ALKPHOS  --  113  PROT 7.5 7.7  ALBUMIN  --  4.2    Assessment:  I head a lengthy discussion with Miss Depriest about angiogram findings and options for management of her right ICA aneurysm.  I explained the options of conservative management with periodic imaging to assess for aneurysm stability versus endovascular treatment with placement of a flow diverter.  We discussed risks and benefits of each option.  Miss Swoboda would prefer to have her aneurysm treated. However, she would like to discuss options with her sister before making a final decision.  She will reach out to [place] once she has made a decision.   Signed: Pedro Earls, MD 02/21/2021, 12:24 PM    I spent a total of    40 Minutes  in face to face in clinical consultation, greater than 50% of which was counseling/coordinating care for right ICA aneurysm.

## 2021-03-04 ENCOUNTER — Telehealth (HOSPITAL_COMMUNITY): Payer: Self-pay | Admitting: Radiology

## 2021-03-04 ENCOUNTER — Other Ambulatory Visit (HOSPITAL_COMMUNITY): Payer: Self-pay | Admitting: Neuroradiology

## 2021-03-04 DIAGNOSIS — I671 Cerebral aneurysm, nonruptured: Secondary | ICD-10-CM

## 2021-03-04 NOTE — Telephone Encounter (Signed)
Called pt, left VM for her to call to schedule treatment of her brain aneurysm with Dr. Debbrah Alar. Pt called back and wants to know how long she has to be out of work before she picks a date to schedule. I will speak with Dr. Debbrah Alar and call her back. She agrees with this plan of care. JM

## 2021-03-05 ENCOUNTER — Other Ambulatory Visit: Payer: Self-pay | Admitting: Physician Assistant

## 2021-03-05 MED ORDER — TICAGRELOR 90 MG PO TABS: 90.0000 mg | ORAL_TABLET | Freq: Two times a day (BID) | ORAL | 3 refills | Status: DC

## 2021-03-19 ENCOUNTER — Ambulatory Visit (HOSPITAL_COMMUNITY): Payer: Medicare HMO

## 2021-03-26 ENCOUNTER — Telehealth: Payer: Self-pay | Admitting: *Deleted

## 2021-03-26 NOTE — Chronic Care Management (AMB) (Signed)
  Chronic Care Management   Note  03/26/2021 Name: JKAYLA SPIEWAK MRN: [id number] DOB: 10-05-1955  Jyl Heinz Stull is a 65 y.o. year old female who is a primary care patient of Isaac Bliss, Rayford Halsted, MD. I reached out to [employer] by phone today in response to a referral sent by Ms. Ocilla PCP.  Ms. Hendler was given information about Chronic Care Management services today including:  CCM service includes personalized support from designated clinical staff supervised by her physician, including individualized plan of care and coordination with other care providers 24/7 contact phone numbers for assistance for urgent and routine care needs. Service will only be billed when office clinical staff spend 20 minutes or more in a month to coordinate care. Only one practitioner may furnish and bill the service in a calendar month. The patient may stop CCM services at any time (effective at the end of the month) by phone call to the office staff. The patient is responsible for co-pay (up to 20% after annual deductible is met) if co-pay is required by the individual health plan.   Patient agreed to services and verbal consent obtained.   Follow up plan: Telephone appointment with care management team member scheduled for:05/06/21  [place] [facility]  Direct Dial: [phone]

## 2021-04-09 ENCOUNTER — Telehealth (HOSPITAL_COMMUNITY): Payer: Self-pay | Admitting: Radiology

## 2021-04-09 NOTE — Telephone Encounter (Signed)
Pt called with questions concerning Brilinta vs/ Plavix and would like a call back from Dr. Debbrah Alar to discuss. I will have Dr. Debbrah Alar give the patient a call as soon as she can. Pt agrees with this plan of care. JM

## 2021-04-10 DIAGNOSIS — R42 Dizziness and giddiness: Secondary | ICD-10-CM | POA: Diagnosis not present

## 2021-04-10 DIAGNOSIS — J302 Other seasonal allergic rhinitis: Secondary | ICD-10-CM | POA: Diagnosis not present

## 2021-04-10 DIAGNOSIS — H903 Sensorineural hearing loss, bilateral: Secondary | ICD-10-CM | POA: Diagnosis not present

## 2021-04-18 DIAGNOSIS — Z124 Encounter for screening for malignant neoplasm of cervix: Secondary | ICD-10-CM | POA: Diagnosis not present

## 2021-04-18 DIAGNOSIS — B354 Tinea corporis: Secondary | ICD-10-CM | POA: Diagnosis not present

## 2021-04-18 DIAGNOSIS — Z1231 Encounter for screening mammogram for malignant neoplasm of breast: Secondary | ICD-10-CM | POA: Diagnosis not present

## 2021-04-18 DIAGNOSIS — Z6831 Body mass index (BMI) 31.0-31.9, adult: Secondary | ICD-10-CM | POA: Diagnosis not present

## 2021-04-18 LAB — HM MAMMOGRAPHY

## 2021-04-21 ENCOUNTER — Other Ambulatory Visit: Payer: Self-pay | Admitting: Neuroradiology

## 2021-04-21 LAB — HM PAP SMEAR: HM Pap smear: NORMAL

## 2021-04-22 ENCOUNTER — Encounter (HOSPITAL_COMMUNITY): Payer: Self-pay | Admitting: *Deleted

## 2021-04-23 ENCOUNTER — Other Ambulatory Visit: Payer: Self-pay

## 2021-04-23 ENCOUNTER — Other Ambulatory Visit: Payer: Self-pay | Admitting: Radiology

## 2021-04-23 ENCOUNTER — Encounter (HOSPITAL_COMMUNITY): Payer: Self-pay | Admitting: *Deleted

## 2021-04-23 LAB — SARS CORONAVIRUS 2 (TAT 6-24 HRS): SARS Coronavirus 2: NEGATIVE

## 2021-04-23 NOTE — Progress Notes (Signed)
DUE TO COVID-19 ONLY ONE VISITOR IS ALLOWED TO COME WITH YOU AND STAY IN THE WAITING ROOM ONLY DURING PRE OP AND PROCEDURE DAY OF SURGERY.   Two VISITORS MAY VISIT WITH YOU AFTER SURGERY IN YOUR PRIVATE ROOM DURING VISITING HOURS ONLY!  PCP - Dr Lelon Frohlich Cardiologist - n/a Gertie Fey - Dr [place] Neurologist - Dr Tomi Likens  Chest x-ray - n/a EKG - DOS 04/24/21 Stress Test - n/a ECHO - n/a Cardiac Cath - n/a  ICD Pacemaker/Loop - n/a  Sleep Study -  n/a CPAP - none  Aspirin/Blood Thinner Instructions:  Per MD's orders, patient is to continue to take Brilinta and ASA prior to surgery.  Patient verbalized that she is taking these meds.    Anesthesia review: Yes  STOP now taking any Aspirin (unless otherwise instructed by your surgeon), Aleve, Naproxen, Ibuprofen, Motrin, Advil, Goody's, BC's, all herbal medications, fish oil, and all vitamins.   Coronavirus Screening Covid test on Monday, 04/21/21 was negativcis scheduled on DOS   Do you have any of the following symptoms:  Cough yes/no: No Fever (>100.76F)  yes/no: No Runny nose yes/no: No Sore throat yes/no: No Difficulty breathing/shortness of breath  yes/no: No  Have you traveled in the last 14 days and where? yes/no: No  Patient verbalized understanding of instructions that were given via phone.

## 2021-04-24 ENCOUNTER — Inpatient Hospital Stay (HOSPITAL_COMMUNITY)
Admission: RE | Admit: 2021-04-24 | Discharge: 2021-04-24 | Disposition: A | Discharge status: Home / Self Care | Payer: Medicare HMO | Source: Ambulatory Visit | Attending: Neuroradiology | Admitting: Neuroradiology

## 2021-04-24 ENCOUNTER — Ambulatory Visit (HOSPITAL_COMMUNITY): Payer: Medicare HMO | Admitting: Physician Assistant

## 2021-04-24 ENCOUNTER — Inpatient Hospital Stay (HOSPITAL_COMMUNITY)
Admission: AD | Admit: 2021-04-24 | Discharge: 2021-04-25 | DRG: 027 | Disposition: A | Discharge status: Home / Self Care | Payer: Medicare HMO | Attending: Neuroradiology | Admitting: Neuroradiology

## 2021-04-24 ENCOUNTER — Encounter (HOSPITAL_COMMUNITY): Payer: Self-pay

## 2021-04-24 ENCOUNTER — Encounter (HOSPITAL_COMMUNITY)
Admission: AD | Disposition: A | Discharge status: Home / Self Care | Payer: Self-pay | Source: Home / Self Care | Attending: Neuroradiology

## 2021-04-24 DIAGNOSIS — F411 Generalized anxiety disorder: Secondary | ICD-10-CM | POA: Diagnosis not present

## 2021-04-24 DIAGNOSIS — Z79899 Other long term (current) drug therapy: Secondary | ICD-10-CM | POA: Diagnosis not present

## 2021-04-24 DIAGNOSIS — K219 Gastro-esophageal reflux disease without esophagitis: Secondary | ICD-10-CM | POA: Diagnosis not present

## 2021-04-24 DIAGNOSIS — I10 Essential (primary) hypertension: Secondary | ICD-10-CM | POA: Diagnosis present

## 2021-04-24 DIAGNOSIS — I671 Cerebral aneurysm, nonruptured: Secondary | ICD-10-CM | POA: Diagnosis not present

## 2021-04-24 DIAGNOSIS — E042 Nontoxic multinodular goiter: Secondary | ICD-10-CM | POA: Diagnosis not present

## 2021-04-24 DIAGNOSIS — Z7982 Long term (current) use of aspirin: Secondary | ICD-10-CM | POA: Diagnosis not present

## 2021-04-24 DIAGNOSIS — I720 Aneurysm of carotid artery: Secondary | ICD-10-CM | POA: Diagnosis not present

## 2021-04-24 DIAGNOSIS — F32A Depression, unspecified: Secondary | ICD-10-CM | POA: Diagnosis not present

## 2021-04-24 DIAGNOSIS — F418 Other specified anxiety disorders: Secondary | ICD-10-CM | POA: Diagnosis not present

## 2021-04-24 DIAGNOSIS — Z87891 Personal history of nicotine dependence: Secondary | ICD-10-CM

## 2021-04-24 DIAGNOSIS — Z8249 Family history of ischemic heart disease and other diseases of the circulatory system: Secondary | ICD-10-CM | POA: Diagnosis not present

## 2021-04-24 DIAGNOSIS — I6789 Other cerebrovascular disease: Secondary | ICD-10-CM | POA: Diagnosis not present

## 2021-04-24 HISTORY — PX: IR US GUIDE VASC ACCESS RIGHT: IMG2390

## 2021-04-24 HISTORY — PX: IR TRANSCATH/EMBOLIZ: IMG695

## 2021-04-24 HISTORY — PX: RADIOLOGY WITH ANESTHESIA: SHX6223

## 2021-04-24 HISTORY — DX: Family history of other specified conditions: Z84.89

## 2021-04-24 HISTORY — PX: IR PTA INTRACRANIAL: IMG2344

## 2021-04-24 HISTORY — PX: IR ANGIO EXTERNAL CAROTID SEL EXT CAROTID UNI R MOD SED: IMG5371

## 2021-04-24 HISTORY — DX: Angina pectoris, unspecified: I20.9

## 2021-04-24 HISTORY — PX: IR ANGIO INTRA EXTRACRAN SEL INTERNAL CAROTID UNI R MOD SED: IMG5362

## 2021-04-24 HISTORY — PX: IR 3D INDEPENDENT WKST: IMG2385

## 2021-04-24 HISTORY — PX: IR CT HEAD LTD: IMG2386

## 2021-04-24 LAB — POCT ACTIVATED CLOTTING TIME
Activated Clotting Time: 121 seconds
Activated Clotting Time: 190 seconds
Activated Clotting Time: 242 seconds
Activated Clotting Time: 242 seconds

## 2021-04-24 LAB — BASIC METABOLIC PANEL
Anion gap: 7 (ref 5–15)
BUN: 9 mg/dL (ref 8–23)
CO2: 26 mmol/L (ref 22–32)
Calcium: 9.1 mg/dL (ref 8.9–10.3)
Chloride: 106 mmol/L (ref 98–111)
Creatinine, Ser: 0.88 mg/dL (ref 0.44–1.00)
GFR, Estimated: >60 mL/min (ref >60)
Glucose, Bld: 112 mg/dL — ABNORMAL HIGH (ref 70–99)
Potassium: 3.2 mmol/L — ABNORMAL LOW (ref 3.5–5.1)
Sodium: 139 mmol/L (ref 135–145)

## 2021-04-24 LAB — CBC WITH DIFFERENTIAL/PLATELET
Abs Immature Granulocytes: 0.01 10*3/uL (ref 0.00–0.07)
Basophils Absolute: 0 10*3/uL (ref 0.0–0.1)
Basophils Relative: 1 %
Eosinophils Absolute: 0.2 10*3/uL (ref 0.0–0.5)
Eosinophils Relative: 3 %
HCT: 42.5 % (ref 36.0–46.0)
Hemoglobin: 14.2 g/dL (ref 12.0–15.0)
Immature Granulocytes: 0 %
Lymphocytes Relative: 27 %
Lymphs Abs: 1.4 10*3/uL (ref 0.7–4.0)
MCH: 31.3 pg (ref 26.0–34.0)
MCHC: 33.4 g/dL (ref 30.0–36.0)
MCV: 93.6 fL (ref 80.0–100.0)
Monocytes Absolute: 0.4 10*3/uL (ref 0.1–1.0)
Monocytes Relative: 8 %
Neutro Abs: 3.2 10*3/uL (ref 1.7–7.7)
Neutrophils Relative %: 61 %
Platelets: 296 10*3/uL (ref 150–400)
RBC: 4.54 MIL/uL (ref 3.87–5.11)
RDW: 12.7 % (ref 11.5–15.5)
WBC: 5.2 10*3/uL (ref 4.0–10.5)
nRBC: 0 % (ref 0.0–0.2)

## 2021-04-24 LAB — PROTIME-INR
INR: 1 (ref 0.8–1.2)
Prothrombin Time: 13.1 seconds (ref 11.4–15.2)

## 2021-04-24 LAB — MRSA NEXT GEN BY PCR, NASAL: MRSA by PCR Next Gen: NOT DETECTED

## 2021-04-24 SURGERY — IR WITH ANESTHESIA
Anesthesia: General

## 2021-04-24 MED ORDER — PHENYLEPHRINE 40 MCG/ML (10ML) SYRINGE FOR IV PUSH (FOR BLOOD PRESSURE SUPPORT)
PREFILLED_SYRINGE | INTRAVENOUS | Status: DC | PRN
  Administered 2021-04-24 (×2): 40 ug via INTRAVENOUS
  Administered 2021-04-24 (×6): 120 ug via INTRAVENOUS

## 2021-04-24 MED ORDER — PANTOPRAZOLE SODIUM 40 MG PO TBEC
40.0000 mg | DELAYED_RELEASE_TABLET | Freq: Every day | ORAL | Status: DC
  Filled 2021-04-24: qty 1

## 2021-04-24 MED ORDER — PROPOFOL 10 MG/ML IV BOLUS
INTRAVENOUS | Status: DC | PRN
  Administered 2021-04-24: 130 mg via INTRAVENOUS

## 2021-04-24 MED ORDER — LIDOCAINE 2% (20 MG/ML) 5 ML SYRINGE
INTRAMUSCULAR | Status: DC | PRN
  Administered 2021-04-24: 60 mg via INTRAVENOUS

## 2021-04-24 MED ORDER — TICAGRELOR 90 MG PO TABS
90.0000 mg | ORAL_TABLET | ORAL | Status: AC
  Administered 2021-04-24: 08:00:00 90 mg via ORAL
  Filled 2021-04-24: qty 1

## 2021-04-24 MED ORDER — ASPIRIN 81 MG PO CHEW
81.0000 mg | CHEWABLE_TABLET | Freq: Every day | ORAL | Status: DC
  Administered 2021-04-25: 81 mg via ORAL
  Filled 2021-04-24: qty 1

## 2021-04-24 MED ORDER — HEPARIN SODIUM (PORCINE) 1000 UNIT/ML IJ SOLN
INTRAMUSCULAR | Status: AC
  Filled 2021-04-24: qty 1

## 2021-04-24 MED ORDER — CEFAZOLIN SODIUM-DEXTROSE 2-4 GM/100ML-% IV SOLN
2.0000 g | INTRAVENOUS | Status: AC
  Administered 2021-04-24: 09:00:00 2 g via INTRAVENOUS
  Filled 2021-04-24: qty 100

## 2021-04-24 MED ORDER — ASPIRIN 81 MG PO CHEW: 81.0000 mg | CHEWABLE_TABLET | Freq: Every day | ORAL | Status: DC

## 2021-04-24 MED ORDER — LIDOCAINE HCL (PF) 1 % IJ SOLN
INTRAMUSCULAR | Status: AC | PRN
  Administered 2021-04-24: 5 mL

## 2021-04-24 MED ORDER — PHENYLEPHRINE HCL-NACL 20-0.9 MG/250ML-% IV SOLN
INTRAVENOUS | Status: DC | PRN
  Administered 2021-04-24: 40 ug/min via INTRAVENOUS

## 2021-04-24 MED ORDER — VERAPAMIL HCL 2.5 MG/ML IV SOLN
INTRAVENOUS | Status: AC
  Filled 2021-04-24: qty 2

## 2021-04-24 MED ORDER — ROCURONIUM BROMIDE 10 MG/ML (PF) SYRINGE
PREFILLED_SYRINGE | INTRAVENOUS | Status: DC | PRN
  Administered 2021-04-24: 10 mg via INTRAVENOUS
  Administered 2021-04-24: 70 mg via INTRAVENOUS
  Administered 2021-04-24: 10 mg via INTRAVENOUS

## 2021-04-24 MED ORDER — NITROGLYCERIN 1 MG/10 ML FOR IR/CATH LAB
INTRA_ARTERIAL | Status: AC | PRN
  Administered 2021-04-24: 200 ug via INTRA_ARTERIAL

## 2021-04-24 MED ORDER — LIDOCAINE 5 % EX PTCH: 1.0000 | MEDICATED_PATCH | CUTANEOUS | Status: DC

## 2021-04-24 MED ORDER — FENTANYL CITRATE (PF) 100 MCG/2ML IJ SOLN
INTRAMUSCULAR | Status: DC | PRN
  Administered 2021-04-24: 100 ug via INTRAVENOUS

## 2021-04-24 MED ORDER — IOHEXOL 300 MG/ML  SOLN
150.0000 mL | Freq: Once | INTRAMUSCULAR | Status: AC | PRN
  Administered 2021-04-24: 11:00:00 145 mL via INTRA_ARTERIAL

## 2021-04-24 MED ORDER — LACTATED RINGERS IV SOLN: INTRAVENOUS | Status: DC

## 2021-04-24 MED ORDER — ACETAMINOPHEN 10 MG/ML IV SOLN: 1000.0000 mg | Freq: Once | INTRAVENOUS | Status: DC | PRN

## 2021-04-24 MED ORDER — ACETAMINOPHEN 160 MG/5ML PO SOLN: 650.0000 mg | ORAL | Status: DC | PRN

## 2021-04-24 MED ORDER — TICAGRELOR 90 MG PO TABS: 90.0000 mg | ORAL_TABLET | Freq: Two times a day (BID) | ORAL | Status: DC

## 2021-04-24 MED ORDER — ONDANSETRON HCL 4 MG/2ML IJ SOLN
INTRAMUSCULAR | Status: DC | PRN
  Administered 2021-04-24: 4 mg via INTRAVENOUS

## 2021-04-24 MED ORDER — EPHEDRINE SULFATE 50 MG/ML IJ SOLN
INTRAMUSCULAR | Status: DC | PRN
  Administered 2021-04-24 (×4): 10 mg via INTRAVENOUS

## 2021-04-24 MED ORDER — ONDANSETRON HCL 4 MG/2ML IJ SOLN: 4.0000 mg | Freq: Four times a day (QID) | INTRAMUSCULAR | Status: DC | PRN

## 2021-04-24 MED ORDER — CHLORHEXIDINE GLUCONATE CLOTH 2 % EX PADS
6.0000 | MEDICATED_PAD | Freq: Every day | CUTANEOUS | Status: DC
  Administered 2021-04-25: 6 via TOPICAL

## 2021-04-24 MED ORDER — ORAL CARE MOUTH RINSE: 15.0000 mL | Freq: Once | OROMUCOSAL | Status: AC

## 2021-04-24 MED ORDER — LIDOCAINE HCL 1 % IJ SOLN
INTRAMUSCULAR | Status: AC
  Filled 2021-04-24: qty 20

## 2021-04-24 MED ORDER — DEXAMETHASONE SODIUM PHOSPHATE 10 MG/ML IJ SOLN
INTRAMUSCULAR | Status: DC | PRN
  Administered 2021-04-24: 5 mg via INTRAVENOUS

## 2021-04-24 MED ORDER — ACETAMINOPHEN 325 MG PO TABS
650.0000 mg | ORAL_TABLET | ORAL | Status: DC | PRN
  Administered 2021-04-24 – 2021-04-25 (×5): 650 mg via ORAL
  Filled 2021-04-24 (×5): qty 2

## 2021-04-24 MED ORDER — ASPIRIN EC 325 MG PO TBEC
325.0000 mg | DELAYED_RELEASE_TABLET | ORAL | Status: AC
  Administered 2021-04-24: 08:00:00 325 mg via ORAL
  Filled 2021-04-24: qty 1

## 2021-04-24 MED ORDER — CHLORHEXIDINE GLUCONATE 0.12 % MT SOLN
15.0000 mL | Freq: Once | OROMUCOSAL | Status: AC
  Administered 2021-04-24: 07:00:00 15 mL via OROMUCOSAL
  Filled 2021-04-24: qty 15

## 2021-04-24 MED ORDER — HEPARIN SODIUM (PORCINE) 1000 UNIT/ML IJ SOLN
INTRAMUSCULAR | Status: DC | PRN
  Administered 2021-04-24: 2000 [IU] via INTRAVENOUS

## 2021-04-24 MED ORDER — GLYCOPYRROLATE PF 0.2 MG/ML IJ SOSY
PREFILLED_SYRINGE | INTRAMUSCULAR | Status: DC | PRN
  Administered 2021-04-24: .2 mg via INTRAVENOUS

## 2021-04-24 MED ORDER — CLEVIDIPINE BUTYRATE 0.5 MG/ML IV EMUL
0.0000 mg/h | INTRAVENOUS | Status: DC
Start: 2021-04-24 — End: 2021-04-25

## 2021-04-24 MED ORDER — HEPARIN (PORCINE) 25000 UT/250ML-% IV SOLN
INTRAVENOUS | Status: AC
  Filled 2021-04-24: qty 250

## 2021-04-24 MED ORDER — NIMODIPINE 30 MG PO CAPS
0.0000 mg | ORAL_CAPSULE | ORAL | Status: AC
  Administered 2021-04-24: 08:00:00 30 mg via ORAL
  Filled 2021-04-24: qty 1

## 2021-04-24 MED ORDER — TICAGRELOR 90 MG PO TABS
90.0000 mg | ORAL_TABLET | Freq: Two times a day (BID) | ORAL | Status: DC
  Administered 2021-04-24 – 2021-04-25 (×2): 90 mg via ORAL
  Filled 2021-04-24 (×2): qty 1

## 2021-04-24 MED ORDER — LORAZEPAM 0.5 MG PO TABS
0.5000 mg | ORAL_TABLET | Freq: Every day | ORAL | Status: DC | PRN
  Administered 2021-04-24: 20:00:00 0.5 mg via ORAL
  Filled 2021-04-24: qty 1

## 2021-04-24 MED ORDER — NITROGLYCERIN 1 MG/10 ML FOR IR/CATH LAB
INTRA_ARTERIAL | Status: AC
  Filled 2021-04-24: qty 10

## 2021-04-24 MED ORDER — DESVENLAFAXINE SUCCINATE ER 50 MG PO TB24
50.0000 mg | ORAL_TABLET | Freq: Every day | ORAL | Status: DC
  Filled 2021-04-24: qty 1

## 2021-04-24 MED ORDER — ACETAMINOPHEN 650 MG RE SUPP: 650.0000 mg | RECTAL | Status: DC | PRN

## 2021-04-24 MED ORDER — HEPARIN SODIUM (PORCINE) 1000 UNIT/ML IJ SOLN
INTRAMUSCULAR | Status: AC | PRN
  Administered 2021-04-24: 5000 [IU] via INTRAVENOUS

## 2021-04-24 MED ORDER — METOPROLOL SUCCINATE ER 25 MG PO TB24
12.5000 mg | ORAL_TABLET | Freq: Every day | ORAL | Status: DC
  Administered 2021-04-24: 22:00:00 12.5 mg via ORAL
  Filled 2021-04-24: qty 1

## 2021-04-24 MED ORDER — VERAPAMIL HCL 2.5 MG/ML IV SOLN
INTRAVENOUS | Status: AC | PRN
  Administered 2021-04-24: 5 mg via INTRA_ARTERIAL

## 2021-04-24 MED ORDER — SODIUM CHLORIDE 0.9 % IV SOLN: INTRAVENOUS | Status: DC

## 2021-04-24 MED ORDER — ACETAMINOPHEN 500 MG PO TABS: 1000.0000 mg | ORAL_TABLET | Freq: Once | ORAL | Status: DC | PRN

## 2021-04-24 MED ORDER — ALBUMIN HUMAN 5 % IV SOLN: INTRAVENOUS | Status: DC | PRN

## 2021-04-24 MED ORDER — FENTANYL CITRATE (PF) 100 MCG/2ML IJ SOLN: 25.0000 ug | INTRAMUSCULAR | Status: DC | PRN

## 2021-04-24 MED ORDER — SUGAMMADEX SODIUM 200 MG/2ML IV SOLN
INTRAVENOUS | Status: DC | PRN
  Administered 2021-04-24: 200 mg via INTRAVENOUS

## 2021-04-24 MED ORDER — ACETAMINOPHEN 160 MG/5ML PO SOLN: 1000.0000 mg | Freq: Once | ORAL | Status: DC | PRN

## 2021-04-24 NOTE — Anesthesia Procedure Notes (Signed)
Arterial Line Insertion Start/End11/17/2022 8:25 AM, 04/24/2021 8:30 AM Performed by: CRNA  Patient location: Pre-op. Preanesthetic checklist: patient identified, IV checked, site marked, risks and benefits discussed, surgical consent, monitors and equipment checked, pre-op evaluation, timeout performed and anesthesia consent Lidocaine 1% used for infiltration and patient sedated Left, radial was placed Catheter size: 20 G Hand hygiene performed  and maximum sterile barriers used   Attempts: 1 Procedure performed without using ultrasound guided technique. Following insertion, Biopatch and dressing applied. Post procedure assessment: normal

## 2021-04-24 NOTE — Procedures (Signed)
INTERVENTIONAL NEURORADIOLOGY BRIEF POSTPROCEDURE NOTE  DIAGNOSTIC CEREBRAL ANGIOGRAM AND ENDOVASCULAR ANEURYSM EMBOLIZATION  Attending: Dr. Pedro Earls  Assistant: None.  Diagnosis: Right ICA aneurysm  Access site: RRA  Access closure: TR band  Anesthesia: GETA  Medication used: 7000 units heparin; 300 mcg nitro; 5mg  verapamil.  Complications: None.  Estimated blood loss: Negligible.  Specimen: None.  Findings: 6-7 mm right petrocarvernous ICA aneurysm. Endovascular treatment performed with a 5 x 25 mm PED flow diverter. No thromboembolic or hemorrhagic complication.  The patient tolerated the procedure well without incident or complication and is in stable condition.

## 2021-04-24 NOTE — Sedation Documentation (Signed)
Patient transported to PACU. Report given to receiving RN. Right wrist assessed. Clean, dry and intact. No hematoma noted. Soft to palpation. Pulses intact via doppler.

## 2021-04-24 NOTE — H&P (Signed)
Chief Complaint: Patient was seen in consultation today for Cerebral arteriogram with possible right internal carotid artery aneurysm embolization at the request of Dr Tomi Likens  Supervising Physician: Katherina Right Lyla Glassing  Patient Status: [facility] - Out-pt  History of Present Illness: Kimberly Conway is a 65 y.o. female   Past medical history significant for depression and vertigo who presented to the emergency department due to a 5 day history of worsening dizziness, imbalance and fogginess.  She underwent an MRI/MRA of the head due to concerns for posterior circulation stroke.  The study was negative for stroke, however, MR angiogram revealed a 6 mm cavernous right ICA aneurysm and hypoplasia versus stenosis of the right vertebral artery.  She does not have a family history of brain aneurysm.  She does refer former history of smoking (40-50 pack/year) having quit approximately 7 years ago. 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   Consultation with Dr Tennis Must Sindy Messing 02/21/21: I head a lengthy discussion with Kimberly Conway about angiogram findings and options for management of her right ICA aneurysm.  I explained the options of conservative management with periodic imaging to assess for aneurysm stability versus endovascular treatment with placement of a flow diverter.  We discussed risks and benefits of each option. Kimberly Conway would prefer to have her aneurysm treated.  Pt denies N/V Denies numbness or tingling Denies headache or dizziness Denies vision or speech changes  She is scheduled today for Cerebral arteriogram with possible embolization of R ICA aneurysm Taking Brilinta 90 mg BID and ASA  81 mg daily  Past Medical History:  Diagnosis Date   Anginal pain (HCC)    Depression    Family history of adverse reaction to anesthesia    sister gets nauseous  after anesthesia   GAD (generalized anxiety disorder)    GERD (gastroesophageal reflux disease)    Hepatitis B 1965   HTN (hypertension)    Multiple thyroid nodules    Other atopic dermatitis 08/17/2020   Seasonal allergies    Urticaria    Vertigo     Past Surgical History:  Procedure Laterality Date   APPENDECTOMY     COLONOSCOPY  08/28/2019   DILATION AND CURETTAGE OF UTERUS     IR ANGIO INTRA EXTRACRAN SEL COM CAROTID INNOMINATE UNI R MOD SED  02/14/2021   IR ANGIO INTRA EXTRACRAN SEL INTERNAL CAROTID UNI L MOD SED  02/14/2021   IR ANGIO VERTEBRAL SEL SUBCLAVIAN INNOMINATE UNI R MOD SED  02/14/2021   IR ANGIO VERTEBRAL SEL VERTEBRAL UNI L MOD SED  02/14/2021   IR US GUIDE VASC ACCESS RIGHT  02/14/2021   UPPER GI ENDOSCOPY  08/28/2019    Allergies: Patient has no known allergies.  Medications: Prior to Admission medications   Medication Sig Start Date End Date Taking? Authorizing Provider  acetaminophen (TYLENOL) 500 MG tablet Take 500-1,000 mg by mouth every 6 (six) hours as needed (pain).   Yes [provider]  aspirin EC 81 MG tablet Take 81 mg by mouth in the morning. Swallow whole.   Yes [provider]  b complex vitamins tablet Take 1 tablet by mouth in the morning.   Yes [provider]  Calcium Citrate (CITRACAL PO) Take 2 tablets by mouth daily after lunch.   Yes [provider]  desvenlafaxine (PRISTIQ) 50 MG 24 hr tablet Take 1 tablet (50 mg total)  by mouth daily. 12/27/20  Yes Mozingo, Berdie Ogren, NP  fluticasone [facility]) 50 MCG/ACT nasal spray USE 2 SPRAYS IN EACH NOSTRIL EVERY DAY 12/11/20  Yes Isaac Bliss, Rayford Halsted, MD  Glucosamine-Chondroitin (COSAMIN DS PO) Take 1 tablet by mouth in the morning.   Yes [provider]  hydrocortisone cream 1 % Apply 1 application topically 2 (two) times daily as needed (rash/skin irritation).   Yes [provider]  levocetirizine (XYZAL) 5 MG tablet TAKE 1 TABLET  (5 MG TOTAL) BY MOUTH EVERY EVENING. 10/04/20  Yes Isaac Bliss, Rayford Halsted, MD  LORazepam (ATIVAN) 0.5 MG tablet Take 1 tablet (0.5 mg total) by mouth daily as needed. 02/05/20  Yes Mozingo, Berdie Ogren, NP  metoprolol succinate (TOPROL-XL) 25 MG 24 hr tablet Take 12.5 mg by mouth at bedtime. 01/10/18  Yes [provider]  Multiple Vitamin (MULTIVITAMIN) tablet Take 1 tablet by mouth daily after lunch.   Yes [provider]  Omega-3 Fatty Acids (OMEGA-3 FISH OIL PO) Take 2 capsules by mouth daily after lunch.   Yes [provider]  omeprazole (PRILOSEC) 20 MG capsule Take 1 capsule (20 mg total) by mouth 2 (two) times daily before a meal. 10/08/20  Yes Isaac Bliss, Rayford Halsted, MD  Polyethyl Glycol-Propyl Glycol (LUBRICANT EYE DROPS) 0.4-0.3 % SOLN Place 1-2 drops into both eyes 3 (three) times daily as needed (dry/irritated eyes).   Yes [provider]  Probiotic Product (PROBIOTIC PO) Take 1 capsule by mouth in the morning.   Yes [provider]  sodium chloride (OCEAN) 0.65 % SOLN nasal spray Place 1 spray into both nostrils in the morning.   Yes [provider]  ticagrelor (BRILINTA) 90 MG TABS tablet Take 1 tablet (90 mg total) by mouth 2 (two) times daily. 03/05/21  Yes Candiss Norse A, PA-C  Turmeric 500 MG CAPS Take 500 mg by mouth daily after lunch.   Yes [provider]  meclizine (ANTIVERT) 25 MG tablet Take 1 tablet (25 mg total) by mouth 3 (three) times daily as needed for dizziness. 01/23/21   Milton Ferguson, MD     Family History  Problem Relation Age of Onset   Diabetes Mother    Breast cancer Mother    CAD Father    Hypertension Sister    Breast cancer Sister    Migraines Sister    Migraines Sister    Diabetes Brother    Colon cancer Neg Hx    Esophageal cancer Neg Hx    Pancreatic cancer Neg Hx    Stomach cancer Neg Hx     Social History   Socioeconomic History   Marital status: Single    Spouse  name: Not on file   Number of children: Not on file   Years of education: Not on file   Highest education level: Not on file  Occupational History   Not on file  Tobacco Use   Smoking status: Former    Packs/day: 0.50    Types: Cigarettes    Quit date: 06/21/2014    Years since quitting: 6.8   Smokeless tobacco: Never  Vaping Use   Vaping Use: Never used  Substance and Sexual Activity   Alcohol use: Not Currently    Comment: Quit in 2016   Drug use: Never   Sexual activity: Not Currently    Birth control/protection: Post-menopausal  Other Topics Concern   Not on file  Social History Narrative   Right handed   Social Determinants  of Health   Financial Resource Strain: Not on file  Food Insecurity: Not on file  Transportation Needs: Not on file  Physical Activity: Not on file  Stress: Not on file  Social Connections: Not on file     Review of Systems: A 12 point ROS discussed and pertinent positives are indicated in the HPI above.  All other systems are negative.  Review of Systems  Constitutional:  Negative for fever.  HENT:  Negative for tinnitus.   Respiratory:  Negative for cough and shortness of breath.   Cardiovascular:  Negative for chest pain.  Gastrointestinal:  Negative for abdominal pain.  Musculoskeletal:  Negative for back pain and gait problem.  Neurological:  Positive for dizziness. Negative for tremors, seizures, syncope, facial asymmetry, speech difficulty, weakness, light-headedness, numbness and headaches.  Psychiatric/Behavioral:  Negative for behavioral problems and confusion.    Vital Signs: BP 124/70   Pulse 61   Temp 97.9 F (36.6 C)   Resp 18   Ht 5\' 2"  (1.575 m)   Wt 145 lb (65.8 kg)   SpO2 99%   BMI 26.52 kg/m   Physical Exam Vitals reviewed.  HENT:     Mouth/Throat:     Mouth: Mucous membranes are moist.  Eyes:     Extraocular Movements: Extraocular movements intact.  Cardiovascular:     Rate and Rhythm: Normal rate and  regular rhythm.     Heart sounds: Normal heart sounds.  Pulmonary:     Effort: Pulmonary effort is normal.     Breath sounds: Normal breath sounds.  Abdominal:     Palpations: Abdomen is soft.  Musculoskeletal:        General: Normal range of motion.     Right lower leg: No edema.     Left lower leg: No edema.  Skin:    General: Skin is warm.  Neurological:     Mental Status: She is alert and oriented to person, place, and time.  Psychiatric:        Mood and Affect: Mood normal.        Behavior: Behavior normal.        Thought Content: Thought content normal.        Judgment: Judgment normal.    Imaging: No results found.  Labs:  CBC: Recent Labs    01/23/21 1225 02/14/21 0740 04/24/21 0621  WBC 4.9 5.6 5.2  HGB 14.6 14.5 14.2  HCT 43.2 43.8 42.5  PLT 299 297 296    COAGS: Recent Labs    02/14/21 0740 04/24/21 0621  INR 0.9 1.0    BMP: Recent Labs    01/23/21 1225 02/14/21 0740 04/24/21 0621  NA 138 137 139  K 4.5 4.2 3.2*  CL 106 103 106  CO2 24 26 26   GLUCOSE 98 91 112*  BUN 12 11 9   CALCIUM 9.4 9.0 9.1  CREATININE 0.71 0.89 0.88  GFRNONAA >60 >60 >60    LIVER FUNCTION TESTS: Recent Labs    05/01/20 0000 01/23/21 1225  BILITOT  --  1.0  AST  --  32  ALT  --  28  ALKPHOS  --  113  PROT 7.5 7.7  ALBUMIN  --  4.2    TUMOR MARKERS: No results for input(s): AFPTM, CEA, CA199, CHROMGRNA in the last 8760 hours.  Assessment and Plan:  Right internal carotid artery aneurysm embolization scheduled today  Risks and benefits of cerebral angiogram with intervention were discussed with the patient including, but not  limited to bleeding, infection, vascular injury, contrast induced renal failure, stroke or even death.  This interventional procedure involves the use of X-rays and because of the nature of the planned procedure, it is possible that we will have prolonged use of X-ray fluoroscopy.  Potential radiation risks to you include (but are  not limited to) the following: - A slightly elevated risk for cancer  several years later in life. This risk is typically less than 0.5% percent. This risk is low in comparison to the normal incidence of human cancer, which is 33% for women and 50% for men according to the [employer]. - Radiation induced injury can include skin redness, resembling a rash, tissue breakdown / ulcers and hair loss (which can be temporary or permanent).   The likelihood of either of these occurring depends on the difficulty of the procedure and whether you are sensitive to radiation due to previous procedures, disease, or genetic conditions.   IF your procedure requires a prolonged use of radiation, you will be notified and given written instructions for further action.  It is your responsibility to monitor the irradiated area for the 2 weeks following the procedure and to notify your physician if you are concerned that you have suffered a radiation induced injury.    All of the patient's questions were answered, patient is agreeable to proceed.  Consent signed and in chart.   Thank you for this interesting consult.  I greatly enjoyed meeting Kimberly Conway and look forward to participating in their care.  A copy of this report was sent to the requesting provider on this date.  Electronically Signed: Lavonia Drafts, PA-C 04/24/2021, 8:22 AM   I spent a total of    25 Minutes in face to face in clinical consultation, greater than 50% of which was counseling/coordinating care for RICA aneurysm embolization

## 2021-04-24 NOTE — Progress Notes (Signed)
  Patient seen on post-procedure rounds.  Doing well overall but did c/o a headache.  About to take Tylenol. Feels she will not  need anything stronger.  TR band remains in place. 3 cc removed at 1 pm, no bleeding.  Neuro exam normal.  Plan to D/C tomorrow.  Janea Schwenn S Tieara Flitton PA-C 04/24/2021 1:51 PM

## 2021-04-24 NOTE — Sedation Documentation (Signed)
ACT 190

## 2021-04-24 NOTE — Sedation Documentation (Signed)
ACT 242

## 2021-04-24 NOTE — Progress Notes (Signed)
Patient had urge to void, bed was wet. Bladder scanned for >935mls. Order obtained to I/o cath and got out 927mls.

## 2021-04-24 NOTE — Progress Notes (Signed)
Pharmacy called about Lidocaine patch ordered for patient. I asked them to send to 4th floor ICU so they could apply to Right forearm as we were in process of transferring patient.

## 2021-04-24 NOTE — Sedation Documentation (Signed)
Baseline ACT 121 obtained pre-procedural

## 2021-04-24 NOTE — Anesthesia Preprocedure Evaluation (Signed)
Anesthesia Evaluation  Patient identified by MRN, date of birth, ID band Patient awake    Reviewed: Allergy & Precautions, NPO status , Patient's Chart, lab work & pertinent test results, reviewed documented beta blocker date and time   Airway Mallampati: II  TM Distance: >3 FB Neck ROM: Full    Dental  (+) Teeth Intact, Dental Advisory Given   Pulmonary former smoker,    breath sounds clear to auscultation       Cardiovascular hypertension, Pt. on home beta blockers  Rhythm:Regular Rate:Normal     Neuro/Psych PSYCHIATRIC DISORDERS Anxiety Depression negative neurological ROS     GI/Hepatic GERD  Medicated and Controlled,(+) Hepatitis -, B  Endo/Other  negative endocrine ROS  Renal/GU negative Renal ROS     Musculoskeletal negative musculoskeletal ROS (+)   Abdominal Normal abdominal exam  (+)   Peds  Hematology negative hematology ROS (+)   Anesthesia Other Findings   Reproductive/Obstetrics                             Anesthesia Physical Anesthesia Plan  ASA: 2  Anesthesia Plan: General   Post-op Pain Management:    Induction: Intravenous  PONV Risk Score and Plan: 3 and Ondansetron, Dexamethasone and Treatment may vary due to age or medical condition  Airway Management Planned: Oral ETT  Additional Equipment: Arterial line  Intra-op Plan:   Post-operative Plan: Extubation in OR  Informed Consent: I have reviewed the patients History and Physical, chart, labs and discussed the procedure including the risks, benefits and alternatives for the proposed anesthesia with the patient or authorized representative who has indicated his/her understanding and acceptance.     Dental advisory given  Plan Discussed with: CRNA  Anesthesia Plan Comments:         Anesthesia Quick Evaluation

## 2021-04-24 NOTE — Anesthesia Postprocedure Evaluation (Signed)
Anesthesia Post Note  Patient: Mabry A Harmsen  Procedure(s) Performed: EMBOLIZATION     Patient location during evaluation: PACU Anesthesia Type: General Level of consciousness: awake and alert Pain management: pain level controlled Vital Signs Assessment: post-procedure vital signs reviewed and stable Respiratory status: spontaneous breathing, nonlabored ventilation, respiratory function stable and patient connected to nasal cannula oxygen Cardiovascular status: blood pressure returned to baseline and stable Postop Assessment: no apparent nausea or vomiting Anesthetic complications: no   No notable events documented.  Last Vitals:  Vitals:   04/24/21 1515 04/24/21 1530  BP:    Pulse: 73 70  Resp:    Temp:    SpO2: 95% 96%    Last Pain:  Vitals:   04/24/21 0701  PainSc: 0-No pain                 Augustino Savastano

## 2021-04-24 NOTE — Transfer of Care (Signed)
Immediate Anesthesia Transfer of Care Note  Patient: Kimberly Conway  Procedure(s) Performed: EMBOLIZATION  Patient Location: PACU  Anesthesia Type:General  Level of Consciousness: awake and alert   Airway & Oxygen Therapy: Patient Spontanous Breathing and Patient connected to face mask oxygen  Post-op Assessment: Report given to RN and Post -op Vital signs reviewed and stable  Post vital signs: Reviewed and stable  Last Vitals:  Vitals Value Taken Time  BP 152/111 04/24/21 1127  Temp    Pulse 61 04/24/21 1131  Resp 15 04/24/21 1131  SpO2 92 % 04/24/21 1131  Vitals shown include unvalidated device data.  Last Pain:  Vitals:   04/24/21 0701  PainSc: 0-No pain         Complications: No notable events documented.

## 2021-04-24 NOTE — Anesthesia Procedure Notes (Addendum)
Procedure Name: Intubation Date/Time: 04/24/2021 9:00 AM Performed by: Lorie Phenix, CRNA Pre-anesthesia Checklist: Patient identified, Emergency Drugs available, Suction available and Patient being monitored Patient Re-evaluated:Patient Re-evaluated prior to induction Oxygen Delivery Method: Circle system utilized Preoxygenation: Pre-oxygenation with 100% oxygen Induction Type: IV induction Ventilation: Mask ventilation without difficulty Laryngoscope Size: Mac and 3 Grade View: Grade I Tube type: Oral Tube size: 7.0 mm Number of attempts: 1 Airway Equipment and Method: Stylet and Oral airway Placement Confirmation: ETT inserted through vocal cords under direct vision, positive ETCO2 and breath sounds checked- equal and bilateral Secured at: 22 cm Tube secured with: Tape Dental Injury: Teeth and Oropharynx as per pre-operative assessment

## 2021-04-25 MED ORDER — POTASSIUM CHLORIDE CRYS ER 20 MEQ PO TBCR
40.0000 meq | EXTENDED_RELEASE_TABLET | Freq: Once | ORAL | Status: AC
  Administered 2021-04-25: 40 meq via ORAL
  Filled 2021-04-25: qty 2

## 2021-04-25 NOTE — Consult Note (Signed)
   [facility] Inpatient Consult   04/25/2021  DALAYA SUPPA June 15, 1955 [id number]  [facility] [ACO] Patient: [employer]  Primary Care Provider:  Isaac Bliss, Rayford Halsted, MD   is an embedded provider with a Chronic Care Management team and program, and is listed for the transition of care follow up and appointments.  Patient was screened for Embedded practice service needs for chronic care management and patient is active with the [facility]  Plan:Notification sent to the [facility] Management and made aware of TOC needs for post hospital needs.  Please contact for further questions,  Natividad Brood, RN BSN [facility] Liaison  [phone] business mobile phone Toll free office [phone]  Fax number: [phone] [email or web address]

## 2021-04-25 NOTE — Discharge Summary (Signed)
Patient ID: Kimberly Conway MRN: [id number] DOB/AGE: 67-Jun-1957 67 y.o.  Admit date: 04/24/2021 Discharge date: 04/25/2021  Supervising Physician: Pedro Earls  Patient Status: [facility] - In-pt  Admission Diagnoses:  Right internal carotid artery aneurysm  Discharge Diagnoses:  Principal Problem:   Brain aneurysm   Discharged Condition: good  Hospital Course: Kimberly Conway is a 65 y.o. female with a past medical history significant for depression and vertigo.  She presented to the emergency department due to a 5 day history of worsening dizziness, imbalance and fogginess.    She underwent an MRI/MRA of the head due to concerns for posterior circulation stroke.    MR angiogram revealed a 6 mm cavernous right ICA aneurysm and hypoplasia versus stenosis of the right vertebral artery.    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    Consultation with Dr Tennis Must Sindy Messing 02/21/21: I head a lengthy discussion with Miss Bethel about angiogram findings and options for management of her right ICA aneurysm.  I explained the options of conservative management with periodic imaging to assess for aneurysm stability versus endovascular treatment with placement of a flow diverter.  We discussed risks and benefits of each option. Miss Crader would prefer to have her aneurysm treated.    Yesterday she underwent cerebral angiography with embolization of the right ICA aneurysm with a 5 x 25 mm PED flow diverter.   There was no thromboembolic or hemorrhagic complication.  She was admitted over night for observation.  She is doing very well this morning. She does c/o a right frontal headache and states Tylenol "takes the edge off". She also states a cool compress on her forehead helps. Dr. Karenann Cai explained that a headache is common.  She is not c/o pain  in her right arm this time after the radial access.  She has ambulated to the bathroom. She is about to eat breakfast.  She is ready for discharge home.  Consults: None  Treatments:  INTERVENTIONAL NEURORADIOLOGY BRIEF POSTPROCEDURE NOTE   DIAGNOSTIC CEREBRAL ANGIOGRAM AND ENDOVASCULAR ANEURYSM EMBOLIZATION   Attending: Dr. Pedro Earls   Assistant: None.   Diagnosis: Right ICA aneurysm   Access site: RRA   Access closure: TR band   Anesthesia: GETA   Medication used: 7000 units heparin; 300 mcg nitro; 5mg  verapamil.   Complications: None.   Estimated blood loss: Negligible.   Specimen: None.   Findings: 6-7 mm right petrocarvernous ICA aneurysm. Endovascular treatment performed with a 5 x 25 mm PED flow diverter. No thromboembolic or hemorrhagic complication.   The patient tolerated the procedure well without incident or complication and is in stable condition.  Discharge Exam: Blood pressure 130/66, pulse (!) 50, temperature 98 F (36.7 C), temperature source Axillary, resp. rate 14, height 5\' 2"  (1.575 m), weight 65.8 kg, SpO2 96 %. Alert, awake, and oriented x3. Speech and comprehension intact. PERRL bilaterally. EOMs intact bilaterally without nystagmus or subjective diplopia. Visual fields intact bilaterally. No facial asymmetry. Tongue midline. Motor power symmetric proportional to effort. No pronator drift. Fine motor and coordination intact and symmetric. 5/5 Strength bilaterally Right radial artery puncture site looks good, no bleeding, no hematoma, no pseudoaneurysm Gait not assessed. Romberg not assessed. Heel to toe not assessed.   Disposition:  D/C to Home No strenuous activity and no lifting, pushing, pulling with right arm x 2 weeks.  Ok to use exercise bike, legs only, no pressure on hands/arms.   Electronically Signed: Murrell Redden, PA-C 04/25/2021, 10:16 AM     I have spent Greater Than 30 Minutes discharging  [place].

## 2021-04-28 ENCOUNTER — Other Ambulatory Visit (HOSPITAL_COMMUNITY): Payer: Self-pay | Admitting: Neuroradiology

## 2021-04-28 MED ORDER — METHYLPREDNISOLONE 4 MG PO TBPK: ORAL_TABLET | ORAL | 0 refills | Status: DC

## 2021-04-29 ENCOUNTER — Encounter (HOSPITAL_COMMUNITY): Payer: Self-pay | Admitting: Neuroradiology

## 2021-04-29 ENCOUNTER — Other Ambulatory Visit: Payer: Self-pay | Admitting: Radiology

## 2021-04-29 MED ORDER — METHYLPREDNISOLONE 4 MG PO TBPK: ORAL_TABLET | ORAL | 0 refills | Status: DC

## 2021-04-30 ENCOUNTER — Other Ambulatory Visit: Payer: Self-pay | Admitting: Physician Assistant

## 2021-04-30 MED ORDER — METHYLPREDNISOLONE 4 MG PO TBPK: ORAL_TABLET | ORAL | 0 refills | Status: DC

## 2021-05-06 ENCOUNTER — Telehealth: Payer: Self-pay | Admitting: *Deleted

## 2021-05-06 ENCOUNTER — Telehealth: Payer: Medicare HMO

## 2021-05-06 NOTE — Telephone Encounter (Signed)
Transition Care Management Unsuccessful Follow-up Telephone Call  Date of discharge and from where:  04/15/21 [facility]  Attempts:  1st Attempt  Reason for unsuccessful TCM follow-up call:  Left voice message  Kelli Churn RN, CCM, [facility] Management Coordinator - Managed [place] High Risk [phone]

## 2021-05-07 ENCOUNTER — Other Ambulatory Visit: Payer: Self-pay | Admitting: Internal Medicine

## 2021-05-07 ENCOUNTER — Other Ambulatory Visit: Payer: Self-pay

## 2021-05-07 ENCOUNTER — Encounter: Payer: Self-pay | Admitting: Adult Health

## 2021-05-07 ENCOUNTER — Telehealth: Payer: Self-pay

## 2021-05-07 ENCOUNTER — Ambulatory Visit: Payer: Medicare HMO | Admitting: Adult Health

## 2021-05-07 DIAGNOSIS — F331 Major depressive disorder, recurrent, moderate: Secondary | ICD-10-CM | POA: Diagnosis not present

## 2021-05-07 DIAGNOSIS — F411 Generalized anxiety disorder: Secondary | ICD-10-CM

## 2021-05-07 DIAGNOSIS — J309 Allergic rhinitis, unspecified: Secondary | ICD-10-CM

## 2021-05-07 NOTE — Progress Notes (Signed)
Kimberly Conway [id number] 1955/07/01 65 y.o.  Subjective:   Patient ID:  Kimberly Conway is a 65 y.o. (DOB 11-13-1955) female.  Chief Complaint: No chief complaint on file.   HPI [name] presents to the office today for follow-up of MDD and GAD.  Describes mood today as "ok". Pleasant. Denies tearfulness. Mood symptoms - denies depression and irritability. Increased anxiety with recent health issues. Recovering from a brain aneurysm - has been out of work for the past 2 weeks. Stating "I'm not doing good emotionally". Does not feel like her mind is the best right now. Mother has moved here to help out while she recovers. Feels like medications are helpful. Stable interest and motivation. Taking medications as prescribed. Energy levels improving. Active, has a regular exercise routine.  Enjoys some usual interests and activities. Single. Lives alone with 2 cats. No children. Has a sister local - sees her on Saturdays. Other family lives in [place], [place].  Appetite adequate. Weight loss - 15 pounds - 145 from 160 pounds, 61". Sleeps well most nights. Increased need to sleep - averages 8 to 10 hours. Focus and concentration stable. Completing tasks. Managing aspects of household. Works full time for a [employer] - 40 hours a week - out of work currently on disability.  Denies SI or HI.  Denies AH or VH. Seeing shadows.  Previous medication trials: Ativan, Pristiq    PHQ2-9    [place] Office Visit from 05/01/2020 in [place] at [facility] from 09/21/2019 in [place] at [facility] from 06/22/2019 in [place] at [facility] Total Score 0 2 0  PHQ-9 Total Score 0 5 4      Flowsheet Row Admission (Discharged) from 04/24/2021 in [facility] NEURO/TRAUMA/SURGICAL ICU IR 90MIN MCIR2 from 02/14/2021 in [place] ED from 01/23/2021 in [facility] DEPT   C-SSRS RISK CATEGORY No Risk No Risk No Risk        Review of Systems:  Review of Systems  Musculoskeletal:  Negative for gait problem.  Neurological:  Negative for tremors.  Psychiatric/Behavioral:         Please refer to HPI   Medications: I have reviewed the patient's current medications.  Current Outpatient Medications  Medication Sig Dispense Refill   acetaminophen (TYLENOL) 500 MG tablet Take 500-1,000 mg by mouth every 6 (six) hours as needed (pain).     aspirin EC 81 MG tablet Take 81 mg by mouth in the morning. Swallow whole.     b complex vitamins tablet Take 1 tablet by mouth in the morning.     Calcium Citrate (CITRACAL PO) Take 2 tablets by mouth daily after lunch.     desvenlafaxine (PRISTIQ) 50 MG 24 hr tablet Take 1 tablet (50 mg total) by mouth daily. 90 tablet 2   fluticasone (FLONASE) 50 MCG/ACT nasal spray USE 2 SPRAYS IN EACH NOSTRIL EVERY DAY 48 g 0   Glucosamine-Chondroitin (COSAMIN DS PO) Take 1 tablet by mouth in the morning.     hydrocortisone cream 1 % Apply 1 application topically 2 (two) times daily as needed (rash/skin irritation).     levocetirizine (XYZAL) 5 MG tablet TAKE 1 TABLET (5 MG TOTAL) BY MOUTH EVERY EVENING. 90 tablet 1   LORazepam (ATIVAN) 0.5 MG tablet Take 1 tablet (0.5 mg total) by mouth daily as needed. 30 tablet 2   meclizine (ANTIVERT) 25 MG tablet Take 1 tablet (25  mg total) by mouth 3 (three) times daily as needed for dizziness. 30 tablet 0   methylPREDNISolone (MEDROL DOSEPAK) 4 MG TBPK tablet Day 1: 8 mg PO before breakfast, 4 mg after lunch and after dinner, and 8 mg at bedtime; Day 2: 4 mg PO before breakfast, after lunch, and after dinner and 8 mg at bedtime; Day 3: 4 mg PO before breakfast, after lunch, after dinner, and at bedtime; Day 4: 4 mg PO before breakfast, after lunch, and at bedtime; Day 5: 4 mg PO before breakfast and at bedtime; Day 6: 4 mg PO before breakfast. 21 tablet 0   metoprolol succinate (TOPROL-XL) 25 MG 24 hr  tablet Take 12.5 mg by mouth at bedtime.     Multiple Vitamin (MULTIVITAMIN) tablet Take 1 tablet by mouth daily after lunch.     Omega-3 Fatty Acids (OMEGA-3 FISH OIL PO) Take 2 capsules by mouth daily after lunch.     omeprazole (PRILOSEC) 20 MG capsule Take 1 capsule (20 mg total) by mouth 2 (two) times daily before a meal. 180 capsule 3   Polyethyl Glycol-Propyl Glycol (LUBRICANT EYE DROPS) 0.4-0.3 % SOLN Place 1-2 drops into both eyes 3 (three) times daily as needed (dry/irritated eyes).     Probiotic Product (PROBIOTIC PO) Take 1 capsule by mouth in the morning.     sodium chloride (OCEAN) 0.65 % SOLN nasal spray Place 1 spray into both nostrils in the morning.     ticagrelor (BRILINTA) 90 MG TABS tablet Take 1 tablet (90 mg total) by mouth 2 (two) times daily. 60 tablet 3   Turmeric 500 MG CAPS Take 500 mg by mouth daily after lunch.     No current facility-administered medications for this visit.    Medication Side Effects: None  Allergies: No Known Allergies  Past Medical History:  Diagnosis Date   Anginal pain ([place])    Depression    Family history of adverse reaction to anesthesia    sister gets nauseous after anesthesia   GAD (generalized anxiety disorder)    GERD (gastroesophageal reflux disease)    Hepatitis B 1965   HTN (hypertension)    Multiple thyroid nodules    Other atopic dermatitis 08/17/2020   Seasonal allergies    Urticaria    Vertigo     Past Medical History, Surgical history, Social history, and Family history were reviewed and updated as appropriate.   Please see review of systems for further details on the patient's review from today.   Objective:   Physical Exam:  There were no vitals taken for this visit.  Physical Exam Constitutional:      General: She is not in acute distress. Musculoskeletal:        General: No deformity.  Neurological:     Mental Status: She is alert and oriented to person, place, and time.     Coordination:  Coordination normal.  Psychiatric:        Attention and Perception: Attention and perception normal. She does not perceive auditory or visual hallucinations.        Mood and Affect: Mood normal. Mood is not anxious or depressed. Affect is not labile, blunt, angry or inappropriate.        Speech: Speech normal.        Behavior: Behavior normal.        Thought Content: Thought content normal. Thought content is not paranoid or delusional. Thought content does not include homicidal or suicidal ideation. Thought content does not include  homicidal or suicidal plan.        Cognition and Memory: Cognition and memory normal.        Judgment: Judgment normal.     Comments: Insight intact    Lab Review:     Component Value Date/Time   NA 139 04/24/2021 0621   K 3.2 (L) 04/24/2021 0621   CL 106 04/24/2021 0621   CO2 26 04/24/2021 0621   GLUCOSE 112 (H) 04/24/2021 0621   BUN 9 04/24/2021 0621   CREATININE 0.88 04/24/2021 0621   CALCIUM 9.1 04/24/2021 0621   PROT 7.7 01/23/2021 1225   PROT 7.5 05/01/2020 0000   ALBUMIN 4.2 01/23/2021 1225   AST 32 01/23/2021 1225   ALT 28 01/23/2021 1225   ALKPHOS 113 01/23/2021 1225   BILITOT 1.0 01/23/2021 1225   GFRNONAA >60 04/24/2021 0621       Component Value Date/Time   WBC 5.2 04/24/2021 0621   RBC 4.54 04/24/2021 0621   HGB 14.2 04/24/2021 0621   HCT 42.5 04/24/2021 0621   PLT 296 04/24/2021 0621   MCV 93.6 04/24/2021 0621   MCH 31.3 04/24/2021 0621   MCHC 33.4 04/24/2021 0621   RDW 12.7 04/24/2021 0621   LYMPHSABS 1.4 04/24/2021 0621   MONOABS 0.4 04/24/2021 0621   EOSABS 0.2 04/24/2021 0621   BASOSABS 0.0 04/24/2021 0621    No results found for: POCLITH, LITHIUM   No results found for: PHENYTOIN, PHENOBARB, VALPROATE, CBMZ   .res Assessment: Plan:    Plan:  PDMP reviewed  1. Pristiq 50mg  daily 2. Ativan 0.5mg  daily as needed  Read and reviewed note with patient for accuracy.   RTC 6/12 months  Patient advised to  contact office with any questions, adverse effects, or acute worsening in signs and symptoms.  Discussed potential benefits, risk, and side effects of benzodiazepines to include potential risk of tolerance and dependence, as well as possible drowsiness.  Advised patient not to drive if experiencing drowsiness and to take lowest possible effective dose to minimize risk of dependence and tolerance. Diagnoses and all orders for this visit:  Generalized anxiety disorder  Major depressive disorder, recurrent episode, moderate ([place])    Please see After Visit Summary for patient specific instructions.  No future appointments.  No orders of the defined types were placed in this encounter.   -------------------------------

## 2021-05-07 NOTE — Telephone Encounter (Signed)
Transition Care Management Unsuccessful Follow-up Telephone Call  Date of discharge and from where:  04/25/21 from [facility]  Attempts:  2nd Attempt  Reason for unsuccessful TCM follow-up call:  Left voice message  Thea Silversmith, RN, MSN, BSN, [facility] Care Management Coordinator [phone]

## 2021-05-08 ENCOUNTER — Telehealth: Payer: Self-pay | Admitting: *Deleted

## 2021-05-08 NOTE — Telephone Encounter (Signed)
Transition Care Management Unsuccessful Follow-up Telephone Call  Date of discharge and from where:  [facility]  04/24/2021  Attempts:  2nd Attempt  Reason for unsuccessful TCM follow-up call:  Left voice message  Jacqlyn Larsen [facility], BSN RN Case Manager [phone]

## 2021-05-13 ENCOUNTER — Other Ambulatory Visit (HOSPITAL_COMMUNITY): Payer: Self-pay | Admitting: Neuroradiology

## 2021-05-13 ENCOUNTER — Telehealth (HOSPITAL_COMMUNITY): Payer: Self-pay

## 2021-05-13 ENCOUNTER — Telehealth: Payer: Self-pay | Admitting: Adult Health

## 2021-05-13 DIAGNOSIS — R42 Dizziness and giddiness: Secondary | ICD-10-CM

## 2021-05-13 DIAGNOSIS — I671 Cerebral aneurysm, nonruptured: Secondary | ICD-10-CM

## 2021-05-13 NOTE — Telephone Encounter (Signed)
Mailed jury duty letter

## 2021-05-13 NOTE — Telephone Encounter (Signed)
Called to schedule consult, no answer, left vm. AW  

## 2021-05-23 ENCOUNTER — Other Ambulatory Visit: Payer: Self-pay

## 2021-05-23 ENCOUNTER — Ambulatory Visit (HOSPITAL_COMMUNITY)
Admission: RE | Admit: 2021-05-23 | Discharge: 2021-05-23 | Disposition: A | Discharge status: Home / Self Care | Payer: Medicare HMO | Source: Ambulatory Visit | Attending: Neuroradiology | Admitting: Neuroradiology

## 2021-05-23 DIAGNOSIS — Z9889 Other specified postprocedural states: Secondary | ICD-10-CM | POA: Diagnosis not present

## 2021-05-23 DIAGNOSIS — R42 Dizziness and giddiness: Secondary | ICD-10-CM

## 2021-05-23 DIAGNOSIS — I671 Cerebral aneurysm, nonruptured: Secondary | ICD-10-CM

## 2021-05-23 NOTE — Progress Notes (Signed)
Referring Physician(s): de Macedo [place]  Chief Complaint: The patient is seen in follow up today s/p right ICA aneurysm.  History of present illness:  Kimberly Conway is a 65 year old female with a past medical history significant for depression and vertigo who presented to the emergency department due to a 5 day history of worsening dizziness, imbalance and fogginess. She underwent an MRI/MRA of the head due to concerns for posterior circulation stroke. The study was negative for stroke, however, MR angiogram revealed a 6 mm cavernous right ICA aneurysm which was confirmed during cerebral angiogram performed on February 14, 2021. She does refer former history of smoking (40-50 pack/year) having quit approximately 7 years ago. Findings of the angiogram were discussed with Kimberly Conway who elected to have her aneurysm treated endovascularly.  She was then started on dual anti-platelet therapy with aspirin and Brilinta and she had her aneurysm successfully treated endovascularly on 04/24/2021 with the use of flow diverter across the neck of the aneurysm.   A few days after intervention, she developed right-sided headaches with 1 episode of right-sided visual field defect and blurry vision which resolved after resting.  The headache persisted for several days but she informs me that it has improved significantly.  She refers mild persistent right temporal headache and dizziness.  She also has some mild discomfort in her right arm with effort.   Past Medical History:  Diagnosis Date   Anginal pain ([place])    Depression    Family history of adverse reaction to anesthesia    sister gets nauseous after anesthesia   GAD (generalized anxiety disorder)    GERD (gastroesophageal reflux disease)    Hepatitis B 1965   HTN (hypertension)    Multiple thyroid nodules    Other atopic dermatitis 08/17/2020   Seasonal allergies    Urticaria    Vertigo     Past Surgical History:  Procedure  Laterality Date   APPENDECTOMY     COLONOSCOPY  08/28/2019   DILATION AND CURETTAGE OF UTERUS     IR 3D INDEPENDENT WKST  04/24/2021   IR ANGIO EXTERNAL CAROTID SEL EXT CAROTID UNI R MOD SED  04/24/2021   IR ANGIO INTRA EXTRACRAN SEL COM CAROTID INNOMINATE UNI R MOD SED  02/14/2021   IR ANGIO INTRA EXTRACRAN SEL INTERNAL CAROTID UNI L MOD SED  02/14/2021   IR ANGIO INTRA EXTRACRAN SEL INTERNAL CAROTID UNI R MOD SED  04/24/2021   IR ANGIO VERTEBRAL SEL SUBCLAVIAN INNOMINATE UNI R MOD SED  02/14/2021   IR ANGIO VERTEBRAL SEL VERTEBRAL UNI L MOD SED  02/14/2021   IR CT HEAD LTD  04/24/2021   IR PTA INTRACRANIAL  04/24/2021   IR TRANSCATH/EMBOLIZ  04/24/2021   IR US GUIDE VASC ACCESS RIGHT  02/14/2021   IR US GUIDE VASC ACCESS RIGHT  04/24/2021   RADIOLOGY WITH ANESTHESIA N/A 04/24/2021   Procedure: EMBOLIZATION;  Surgeon: Pedro Earls, MD;  Location: [place];  Service: Radiology;  Laterality: N/A;   UPPER GI ENDOSCOPY  08/28/2019    Allergies: Patient has no known allergies.  Medications: Prior to Admission medications   Medication Sig Start Date End Date Taking? Authorizing Provider  acetaminophen (TYLENOL) 500 MG tablet Take 500-1,000 mg by mouth every 6 (six) hours as needed (pain).    [provider]  aspirin EC 81 MG tablet Take 81 mg by mouth in the morning. Swallow whole.    [provider]  b complex vitamins tablet Take  1 tablet by mouth in the morning.    [provider]  Calcium Citrate (CITRACAL PO) Take 2 tablets by mouth daily after lunch.    [provider]  desvenlafaxine (PRISTIQ) 50 MG 24 hr tablet Take 1 tablet (50 mg total) by mouth daily. 12/27/20   Mozingo, Berdie Ogren, NP  fluticasone [facility]) 50 MCG/ACT nasal spray USE 2 SPRAYS IN [facility] NOSTRIL EVERY DAY 05/07/21   Isaac Bliss, Rayford Halsted, MD  Glucosamine-Chondroitin (COSAMIN DS PO) Take 1 tablet by mouth in the morning.    [provider]   hydrocortisone cream 1 % Apply 1 application topically 2 (two) times daily as needed (rash/skin irritation).    [provider]  levocetirizine (XYZAL) 5 MG tablet TAKE 1 TABLET (5 MG TOTAL) BY MOUTH EVERY EVENING. 10/04/20   Isaac Bliss, Rayford Halsted, MD  LORazepam (ATIVAN) 0.5 MG tablet Take 1 tablet (0.5 mg total) by mouth daily as needed. 02/05/20   Mozingo, Berdie Ogren, NP  meclizine (ANTIVERT) 25 MG tablet Take 1 tablet (25 mg total) by mouth 3 (three) times daily as needed for dizziness. 01/23/21   Milton Ferguson, MD  methylPREDNISolone (MEDROL DOSEPAK) 4 MG TBPK tablet Day 1: 8 mg PO before breakfast, 4 mg after lunch and after dinner, and 8 mg at bedtime; Day 2: 4 mg PO before breakfast, after lunch, and after dinner and 8 mg at bedtime; Day 3: 4 mg PO before breakfast, after lunch, after dinner, and at bedtime; Day 4: 4 mg PO before breakfast, after lunch, and at bedtime; Day 5: 4 mg PO before breakfast and at bedtime; Day 6: 4 mg PO before breakfast. 04/30/21   Candiss Norse A, PA-C  metoprolol succinate (TOPROL-XL) 25 MG 24 hr tablet Take 12.5 mg by mouth at bedtime. 01/10/18   [provider]  Multiple Vitamin (MULTIVITAMIN) tablet Take 1 tablet by mouth daily after lunch.    [provider]  Omega-3 Fatty Acids (OMEGA-3 FISH OIL PO) Take 2 capsules by mouth daily after lunch.    [provider]  omeprazole (PRILOSEC) 20 MG capsule Take 1 capsule (20 mg total) by mouth 2 (two) times daily before a meal. 10/08/20   Isaac Bliss, Rayford Halsted, MD  Polyethyl Glycol-Propyl Glycol (LUBRICANT EYE DROPS) 0.4-0.3 % SOLN Place 1-2 drops into both eyes 3 (three) times daily as needed (dry/irritated eyes).    [provider]  Probiotic Product (PROBIOTIC PO) Take 1 capsule by mouth in the morning.    [provider]  sodium chloride (OCEAN) 0.65 % SOLN nasal spray Place 1 spray into both nostrils in the morning.    [provider]   ticagrelor (BRILINTA) 90 MG TABS tablet Take 1 tablet (90 mg total) by mouth 2 (two) times daily. 03/05/21   Joaquim Nam, PA-C  Turmeric 500 MG CAPS Take 500 mg by mouth daily after lunch.    [provider]     Family History  Problem Relation Age of Onset   Diabetes Mother    Breast cancer Mother    CAD Father    Hypertension Sister    Breast cancer Sister    Migraines Sister    Migraines Sister    Diabetes Brother    Colon cancer Neg Hx    Esophageal cancer Neg Hx    Pancreatic cancer Neg Hx    Stomach cancer Neg Hx     Social History   Socioeconomic History   Marital status: Single  Spouse name: Not on file   Number of children: Not on file   Years of education: Not on file   Highest education level: Not on file  Occupational History   Not on file  Tobacco Use   Smoking status: Former    Packs/day: 0.50    Types: Cigarettes    Quit date: 06/21/2014    Years since quitting: 6.9   Smokeless tobacco: Never  Vaping Use   Vaping Use: Never used  Substance and Sexual Activity   Alcohol use: Not Currently    Comment: Quit in 2016   Drug use: Never   Sexual activity: Not Currently    Birth control/protection: Post-menopausal  Other Topics Concern   Not on file  Social History Narrative   Right handed   Social Determinants of Health   Financial Resource Strain: Not on file  Food Insecurity: Not on file  Transportation Needs: Not on file  Physical Activity: Not on file  Stress: Not on file  Social Connections: Not on file     Vital Signs: There were no vitals taken for this visit.  Physical Exam  Imaging: No results found.  Labs:  CBC: Recent Labs    01/23/21 1225 02/14/21 0740 04/24/21 0621  WBC 4.9 5.6 5.2  HGB 14.6 14.5 14.2  HCT 43.2 43.8 42.5  PLT 299 297 296    COAGS: Recent Labs    02/14/21 0740 04/24/21 0621  INR 0.9 1.0    BMP: Recent Labs    01/23/21 1225 02/14/21 0740 04/24/21 0621  NA 138 137  139  K 4.5 4.2 3.2*  CL 106 103 106  CO2 24 26 26   GLUCOSE 98 91 112*  BUN 12 11 9   CALCIUM 9.4 9.0 9.1  CREATININE 0.71 0.89 0.88  GFRNONAA >60 >60 >60    LIVER FUNCTION TESTS: Recent Labs    01/23/21 1225  BILITOT 1.0  AST 32  ALT 28  ALKPHOS 113  PROT 7.7  ALBUMIN 4.2    Assessment:  65 year old female status post endovascular embolization of a right ICA aneurysm with use of a flow diverter device.  She developed headache post intervention.  However, this has significantly improved in the last week with mild residual right temporal headache.  She was instructed to take Tylenol for headaches and avoid NSAID, particularly aspirin.  She no longer has any visual field complaint or any other focal neurological deficit.  She was instructed to continue on daily aspirin 81 mg and filling the 90 mg b.i.d..  She was  also instructed to contact us with any new concern.  Otherwise, we will see her at six-month process procedure.   Signed: Pedro Earls, MD 05/23/2021, 12:38 PM    I spent a total of    15 Minutes in face to face in clinical consultation, greater than 50% of which was counseling/coordinating care for right ICA aneurysm s/p endovascular treatment.

## 2021-06-03 ENCOUNTER — Telehealth: Payer: Self-pay | Admitting: Internal Medicine

## 2021-06-03 NOTE — Telephone Encounter (Signed)
Patient calling in with respiratory symptoms: Shortness of breath, chest pain, palpitations or other red words send to Triage  Does the patient have a fever over 100, cough, congestion, sore throat, runny nose, lost of taste/smell (please list symptoms that patient has)?cough  What date did symptoms start?05-30-2021 (If over 5 days ago, pt may be scheduled for in person visit)  Have you tested for Covid in the last 5 days? No   If yes, was it positive []  OR negative [] ? If positive in the last 5 days, please schedule virtual visit now. If negative, schedule for an in person OV with the next available provider if PCP has no openings. Please also let patient know they will be tested again (follow the script below)  "you will have to arrive 73mins prior to your appt time to be Covid tested. Please park in back of office at the cone & call [phone] to let the staff know you have arrived. A staff member will meet you at your car to do a rapid covid test. Once the test has resulted you will be notified by phone of your results to determine if appt will remain an in person visit or be converted to a virtual/phone visit. If you arrive less than 104mins before your appt time, your visit will be automatically converted to virtual & any recommended testing will happen AFTER the visit." Pt has an appt with dr Jerilee Hoh on 06-05-2021  THINGS TO REMEMBER  If no availability for virtual visit in office,  please schedule another [place] office  If no availability at another [place] office, please instruct patient that they can schedule an evisit or virtual visit through their mychart account. Visits up to 8pm  patients can be seen in office 5 days after positive COVID test

## 2021-06-05 ENCOUNTER — Ambulatory Visit (INDEPENDENT_AMBULATORY_CARE_PROVIDER_SITE_OTHER): Payer: Medicare HMO | Admitting: Internal Medicine

## 2021-06-05 ENCOUNTER — Encounter: Payer: Self-pay | Admitting: Internal Medicine

## 2021-06-05 VITALS — BP 132/78 | HR 64 | Temp 98.3°F | Ht 62.0 in | Wt 161.9 lb

## 2021-06-05 DIAGNOSIS — T59811A Toxic effect of smoke, accidental (unintentional), initial encounter: Secondary | ICD-10-CM
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
## 2021-06-11 ENCOUNTER — Other Ambulatory Visit: Payer: Self-pay | Admitting: Internal Medicine

## 2021-07-02 ENCOUNTER — Ambulatory Visit (INDEPENDENT_AMBULATORY_CARE_PROVIDER_SITE_OTHER): Payer: Medicare HMO | Admitting: Internal Medicine

## 2021-07-02 ENCOUNTER — Encounter: Payer: Self-pay | Admitting: Internal Medicine

## 2021-07-02 VITALS — BP 130/80 | HR 61 | Temp 97.9°F | Ht 62.0 in | Wt 161.5 lb

## 2021-07-02 DIAGNOSIS — R42 Dizziness and giddiness: Secondary | ICD-10-CM

## 2021-07-02 DIAGNOSIS — F419 Anxiety disorder, unspecified: Secondary | ICD-10-CM

## 2021-07-02 DIAGNOSIS — I671 Cerebral aneurysm, nonruptured: Secondary | ICD-10-CM | POA: Diagnosis not present

## 2021-07-02 DIAGNOSIS — I10 Essential (primary) hypertension: Secondary | ICD-10-CM

## 2021-07-02 DIAGNOSIS — J302 Other seasonal allergic rhinitis: Secondary | ICD-10-CM

## 2021-07-02 DIAGNOSIS — K219 Gastro-esophageal reflux disease without esophagitis: Secondary | ICD-10-CM | POA: Diagnosis not present

## 2021-07-02 DIAGNOSIS — Z13820 Encounter for screening for osteoporosis: Secondary | ICD-10-CM

## 2021-07-02 DIAGNOSIS — F32A Depression, unspecified: Secondary | ICD-10-CM | POA: Diagnosis not present

## 2021-07-02 DIAGNOSIS — Z0000 Encounter for general adult medical examination without abnormal findings: Secondary | ICD-10-CM | POA: Diagnosis not present

## 2021-07-02 DIAGNOSIS — Z23 Encounter for immunization: Secondary | ICD-10-CM

## 2021-07-02 LAB — CBC WITH DIFFERENTIAL/PLATELET
Basophils Absolute: 0 10*3/uL (ref 0.0–0.1)
Basophils Relative: 0.8 % (ref 0.0–3.0)
Eosinophils Absolute: 0.2 10*3/uL (ref 0.0–0.7)
Eosinophils Relative: 2.9 % (ref 0.0–5.0)
HCT: 40.9 % (ref 36.0–46.0)
Hemoglobin: 13.7 g/dL (ref 12.0–15.0)
Lymphocytes Relative: 25.3 % (ref 12.0–46.0)
Lymphs Abs: 1.5 10*3/uL (ref 0.7–4.0)
MCHC: 33.4 g/dL (ref 30.0–36.0)
MCV: 92.2 fl (ref 78.0–100.0)
Monocytes Absolute: 0.4 10*3/uL (ref 0.1–1.0)
Monocytes Relative: 7.3 % (ref 3.0–12.0)
Neutro Abs: 3.8 10*3/uL (ref 1.4–7.7)
Neutrophils Relative %: 63.7 % (ref 43.0–77.0)
Platelets: 303 10*3/uL (ref 150.0–400.0)
RBC: 4.44 Mil/uL (ref 3.87–5.11)
RDW: 12.9 % (ref 11.5–15.5)
WBC: 5.9 10*3/uL (ref 4.0–10.5)

## 2021-07-02 LAB — COMPREHENSIVE METABOLIC PANEL
ALT: 38 U/L — ABNORMAL HIGH (ref 0–35)
AST: 24 U/L (ref 0–37)
Albumin: 4.4 g/dL (ref 3.5–5.2)
Alkaline Phosphatase: 128 U/L — ABNORMAL HIGH (ref 39–117)
BUN: 11 mg/dL (ref 6–23)
CO2: 29 mEq/L (ref 19–32)
Calcium: 9.7 mg/dL (ref 8.4–10.5)
Chloride: 103 mEq/L (ref 96–112)
Creatinine, Ser: 0.83 mg/dL (ref 0.40–1.20)
GFR: 73.01 mL/min (ref >60.00)
Glucose, Bld: 101 mg/dL — ABNORMAL HIGH (ref 70–99)
Potassium: 4.1 mEq/L (ref 3.5–5.1)
Sodium: 139 mEq/L (ref 135–145)
Total Bilirubin: 0.7 mg/dL (ref 0.2–1.2)
Total Protein: 7.5 g/dL (ref 6.0–8.3)

## 2021-07-02 LAB — LIPID PANEL
Cholesterol: 189 mg/dL (ref 0–200)
HDL: 61.3 mg/dL (ref >39.00)
LDL Cholesterol: 102 mg/dL — ABNORMAL HIGH (ref 0–99)
NonHDL: 128.17
Total CHOL/HDL Ratio: 3
Triglycerides: 133 mg/dL (ref 0.0–149.0)
VLDL: 26.6 mg/dL (ref 0.0–40.0)

## 2021-07-02 LAB — VITAMIN D 25 HYDROXY (VIT D DEFICIENCY, FRACTURES): VITD: 38.8 ng/mL (ref 30.00–100.00)

## 2021-07-02 LAB — TSH: TSH: 3.23 u[IU]/mL (ref 0.35–5.50)

## 2021-07-02 LAB — HEMOGLOBIN A1C: Hgb A1c MFr Bld: 5.1 % (ref 4.6–6.5)

## 2021-07-02 LAB — VITAMIN B12: Vitamin B-12: 747 pg/mL (ref 211–911)

## 2021-07-02 MED ORDER — CETIRIZINE HCL 10 MG PO TABS: 10.0000 mg | ORAL_TABLET | Freq: Every day | ORAL | 11 refills | Status: DC

## 2021-07-02 NOTE — Progress Notes (Signed)
Established Patient Office Visit     This visit occurred during the SARS-CoV-2 public health emergency.  Safety protocols were in place, including screening questions prior to the visit, additional usage of staff PPE, and extensive cleaning of exam room while observing appropriate contact time as indicated for disinfecting solutions.    CC/Reason for Visit: Annual preventive exam and subsequent Medicare wellness visit  HPI: Kimberly Conway is a 66 y.o. female who is coming in today for the above mentioned reasons. Past Medical History is significant for: Depression and anxiety followed by psychiatry, GERD and a right ICA aneurysm status post endovascular treatment last year.  She is on dual antiplatelet therapy with aspirin and Brilinta with plan to continue for at least 6 months which will be April.  She is overdue for conjugated pneumonia vaccine and Tdap.  She had a colonoscopy in 2021, a mammogram and Pap smear in November 2022 with her GYN.  She has no acute concerns or complaints today.   Past Medical/Surgical History: Past Medical History:  Diagnosis Date   Anginal pain ([place])    Depression    Family history of adverse reaction to anesthesia    sister gets nauseous after anesthesia   GAD (generalized anxiety disorder)    GERD (gastroesophageal reflux disease)    Hepatitis B 1965   HTN (hypertension)    Multiple thyroid nodules    Other atopic dermatitis 08/17/2020   Seasonal allergies    Urticaria    Vertigo     Past Surgical History:  Procedure Laterality Date   APPENDECTOMY     COLONOSCOPY  08/28/2019   DILATION AND CURETTAGE OF UTERUS     IR 3D INDEPENDENT WKST  04/24/2021   IR ANGIO EXTERNAL CAROTID SEL EXT CAROTID UNI R MOD SED  04/24/2021   IR ANGIO INTRA EXTRACRAN SEL COM CAROTID INNOMINATE UNI R MOD SED  02/14/2021   IR ANGIO INTRA EXTRACRAN SEL INTERNAL CAROTID UNI L MOD SED  02/14/2021   IR ANGIO INTRA EXTRACRAN SEL INTERNAL CAROTID UNI R MOD SED   04/24/2021   IR ANGIO VERTEBRAL SEL SUBCLAVIAN INNOMINATE UNI R MOD SED  02/14/2021   IR ANGIO VERTEBRAL SEL VERTEBRAL UNI L MOD SED  02/14/2021   IR CT HEAD LTD  04/24/2021   IR PTA INTRACRANIAL  04/24/2021   IR TRANSCATH/EMBOLIZ  04/24/2021   IR US GUIDE VASC ACCESS RIGHT  02/14/2021   IR US GUIDE VASC ACCESS RIGHT  04/24/2021   RADIOLOGY WITH ANESTHESIA N/A 04/24/2021   Procedure: EMBOLIZATION;  Surgeon: Pedro Earls, MD;  Location: [place];  Service: Radiology;  Laterality: N/A;   UPPER GI ENDOSCOPY  08/28/2019    Social History:  reports that she quit smoking about 7 years ago. Her smoking use included cigarettes. She smoked an average of .5 packs per day. She has never used smokeless tobacco. She reports that she does not currently use alcohol. She reports that she does not use drugs.  Allergies: No Known Allergies  Family History:  Family History  Problem Relation Age of Onset   Diabetes Mother    Breast cancer Mother    CAD Father    Hypertension Sister    Breast cancer Sister    Migraines Sister    Migraines Sister    Diabetes Brother    Colon cancer Neg Hx    Esophageal cancer Neg Hx    Pancreatic cancer Neg Hx    Stomach cancer Neg Hx  Current Outpatient Medications:    aspirin EC 81 MG tablet, Take 81 mg by mouth in the morning. Swallow whole., Disp: , Rfl:    b complex vitamins tablet, Take 1 tablet by mouth in the morning., Disp: , Rfl:    Calcium Citrate (CITRACAL PO), Take 2 tablets by mouth daily after lunch., Disp: , Rfl:    cetirizine (ZYRTEC) 10 MG tablet, Take 1 tablet (10 mg total) by mouth daily., Disp: 30 tablet, Rfl: 11   desvenlafaxine (PRISTIQ) 50 MG 24 hr tablet, Take 1 tablet (50 mg total) by mouth daily., Disp: 90 tablet, Rfl: 2   fluticasone (FLONASE) 50 MCG/ACT nasal spray, USE 2 SPRAYS IN EACH NOSTRIL EVERY DAY, Disp: 48 g, Rfl: 0   Glucosamine-Chondroitin (COSAMIN DS PO), Take 1 tablet by mouth in the morning., Disp: ,  Rfl:    levocetirizine (XYZAL) 5 MG tablet, TAKE 1 TABLET (5 MG TOTAL) BY MOUTH EVERY EVENING., Disp: 90 tablet, Rfl: 1   LORazepam (ATIVAN) 0.5 MG tablet, Take 1 tablet (0.5 mg total) by mouth daily as needed., Disp: 30 tablet, Rfl: 2   metoprolol succinate (TOPROL-XL) 25 MG 24 hr tablet, Take 12.5 mg by mouth at bedtime., Disp: , Rfl:    Multiple Vitamin (MULTIVITAMIN) tablet, Take 1 tablet by mouth daily after lunch., Disp: , Rfl:    Omega-3 Fatty Acids (OMEGA-3 FISH OIL PO), Take 2 capsules by mouth daily after lunch., Disp: , Rfl:    Polyethyl Glycol-Propyl Glycol (LUBRICANT EYE DROPS) 0.4-0.3 % SOLN, Place 1-2 drops into both eyes 3 (three) times daily as needed (dry/irritated eyes)., Disp: , Rfl:    Probiotic Product (PROBIOTIC PO), Take 1 capsule by mouth in the morning., Disp: , Rfl:    sodium chloride (OCEAN) 0.65 % SOLN nasal spray, Place 1 spray into both nostrils in the morning., Disp: , Rfl:    ticagrelor (BRILINTA) 90 MG TABS tablet, Take 1 tablet (90 mg total) by mouth 2 (two) times daily., Disp: 60 tablet, Rfl: 3   Turmeric 500 MG CAPS, Take 500 mg by mouth daily after lunch., Disp: , Rfl:    acetaminophen (TYLENOL) 500 MG tablet, Take 500-1,000 mg by mouth every 6 (six) hours as needed (pain). (Patient not taking: Reported on 06/05/2021), Disp: , Rfl:    hydrocortisone cream 1 %, Apply 1 application topically 2 (two) times daily as needed (rash/skin irritation). (Patient not taking: Reported on 06/05/2021), Disp: , Rfl:    meclizine (ANTIVERT) 25 MG tablet, Take 1 tablet (25 mg total) by mouth 3 (three) times daily as needed for dizziness. (Patient not taking: Reported on 06/05/2021), Disp: 30 tablet, Rfl: 0   methylPREDNISolone (MEDROL DOSEPAK) 4 MG TBPK tablet, Day 1: 8 mg PO before breakfast, 4 mg after lunch and after dinner, and 8 mg at bedtime; Day 2: 4 mg PO before breakfast, after lunch, and after dinner and 8 mg at bedtime; Day 3: 4 mg PO before breakfast, after lunch, after  dinner, and at bedtime; Day 4: 4 mg PO before breakfast, after lunch, and at bedtime; Day 5: 4 mg PO before breakfast and at bedtime; Day 6: 4 mg PO before breakfast. (Patient not taking: Reported on 06/05/2021), Disp: 21 tablet, Rfl: 0   omeprazole (PRILOSEC) 20 MG capsule, Take 1 capsule (20 mg total) by mouth 2 (two) times daily before a meal., Disp: 180 capsule, Rfl: 3  Review of Systems:  Constitutional: Denies fever, chills, diaphoresis, appetite change and fatigue.  HEENT: Denies photophobia, eye  pain, redness, hearing loss, ear pain, congestion, sore throat, rhinorrhea, sneezing, mouth sores, trouble swallowing, neck pain, neck stiffness and tinnitus.   Respiratory: Denies SOB, DOE, cough, chest tightness,  and wheezing.   Cardiovascular: Denies chest pain, palpitations and leg swelling.  Gastrointestinal: Denies nausea, vomiting, abdominal pain, diarrhea, constipation, blood in stool and abdominal distention.  Genitourinary: Denies dysuria, urgency, frequency, hematuria, flank pain and difficulty urinating.  Endocrine: Denies: hot or cold intolerance, sweats, changes in hair or nails, polyuria, polydipsia. Musculoskeletal: Denies myalgias, back pain, joint swelling, arthralgias and gait problem.  Skin: Denies pallor, rash and wound.  Neurological: Denies dizziness, seizures, syncope, weakness, light-headedness, numbness and headaches.  Hematological: Denies adenopathy. Easy bruising, personal or family bleeding history  Psychiatric/Behavioral: Denies suicidal ideation, mood changes, confusion, nervousness, sleep disturbance and agitation    Physical Exam: Vitals:   07/02/21 0736  BP: 130/80  Pulse: 61  Temp: 97.9 F (36.6 C)  TempSrc: Oral  SpO2: 97%  Weight: 161 lb 8 oz (73.3 kg)  Height: 5\' 2"  (1.575 m)    Body mass index is 29.54 kg/m.   Constitutional: NAD, calm, comfortable Eyes: PERRL, lids and conjunctivae normal, wearing corrective lenses ENMT: Mucous membranes  are moist. Posterior pharynx clear of any exudate or lesions. Normal dentition. Tympanic membrane is pearly white, no erythema or bulging. Neck: normal, supple, no masses, no thyromegaly Respiratory: clear to auscultation bilaterally, no wheezing, no crackles. Normal respiratory effort. No accessory muscle use.  Cardiovascular: Regular rate and rhythm, no murmurs / rubs / gallops. No extremity edema. 2+ pedal pulses. No carotid bruits.  Abdomen: no tenderness, no masses palpated. No hepatosplenomegaly. Bowel sounds positive.  Musculoskeletal: no clubbing / cyanosis. No joint deformity upper and lower extremities. Good ROM, no contractures. Normal muscle tone.  Skin: no rashes, lesions, ulcers. No induration Neurologic: CN 2-12 grossly intact. Sensation intact, DTR normal. Strength 5/5 in all 4.  Psychiatric: Normal judgment and insight. Alert and oriented x 3. Normal mood.   Subsequent Medicare wellness visit   1. Risk factors, based on past  M,S,F -cardiovascular disease risk factors include age only   2.  Physical activities: Works out on a daily basis   3.  Depression/mood: History of depression followed by psychiatry   4.  Hearing: No perceived issues   5.  ADL's: Independent in all ADLs   6.  Fall risk: Moderate fall risk due to prior history of falls   7.  Home safety: No problems identified   8.  Height weight, and visual acuity: height and weight as above, vision:  Vision Screening   Right eye Left eye Both eyes  Without correction     With correction 20/20 20/20 20/15      9.  Counseling: Advise she update her vaccination status   10. Lab orders based on risk factors: Laboratory update will be reviewed   11. Referral : None today   12. Care plan: Follow-up with me in 6 months   13. Cognitive assessment: No cognitive impairment   14. Screening: Patient provided with a written and personalized 5-10 year screening schedule in the AVS. yes   15. Provider List Update:  PCP, interventional radiologist  16. Advance Directives: Full code   17. Opioids: Patient is not on any opioid prescriptions and has no risk factors for a substance use disorder.   [place] Office Visit from 06/05/2021 in [facility] at [place]  PHQ-9 Total Score 3       Fall Risk  04/24/2021 04/24/2021 04/24/2021 04/25/2021 06/05/2021  Falls in the past year? - - - - 1  Was there an injury with Fall? - - - - 1  Fall Risk Category Calculator - - - - 2  Fall Risk Category - - - - Moderate  Patient Fall Risk Level Moderate fall risk High fall risk High fall risk High fall risk Moderate fall risk     Impression and Plan:  Encounter for preventive health examination -Recommend routine eye and dental care. -Immunizations: PCV 13 in office today, she is also due for Tdap which she will get at pharmacy. -Healthy lifestyle discussed in detail. -Labs to be updated today. -Colon cancer screening: 08/2019 -Breast cancer screening: 04/2021 -Cervical cancer screening: 04/2021 -Lung cancer screening: Not applicable -Prostate cancer screening: Not applicable -DEXA: Ordered today  Need for vaccination against Streptococcus pneumoniae using pneumococcal conjugate vaccine 13  - Plan: Pneumococcal conjugate vaccine 13-valent  Brain aneurysm  -Followed by IR, on DAPT.  Primary hypertension  - Plan: CBC with Differential/Platelet, Comprehensive metabolic panel -Blood pressure is fairly well controlled.  Anxiety and depression  - Plan: TSH, Vitamin B12, VITAMIN D 25 Hydroxy (Vit-D Deficiency, Fractures) -On Pristiq and Ativan followed by psychiatry.  Gastroesophageal reflux disease without esophagitis -Well-controlled on daily omeprazole.  Seasonal allergies  - Plan: cetirizine (ZYRTEC) 10 MG tablet    Patient Instructions  -Nice seeing you today!!  -Lab work today; will notify you once results are available.  -Pneumonia vaccine today.  -Remember your Tdap at  the pharmacy.  -Schedule follow up in 6 months.      Lelon Frohlich, MD [facility]

## 2021-07-02 NOTE — Patient Instructions (Signed)
-  Nice seeing you today!!  -Lab work today; will notify you once results are available.  -Pneumonia vaccine today.  -Remember your Tdap at the pharmacy.  -Schedule follow up in 6 months.

## 2021-07-04 ENCOUNTER — Telehealth: Payer: Self-pay | Admitting: Adult Health

## 2021-07-04 ENCOUNTER — Encounter: Payer: Self-pay | Admitting: Internal Medicine

## 2021-07-04 ENCOUNTER — Other Ambulatory Visit: Payer: Self-pay

## 2021-07-04 DIAGNOSIS — F331 Major depressive disorder, recurrent, moderate: Secondary | ICD-10-CM

## 2021-07-04 DIAGNOSIS — F411 Generalized anxiety disorder: Secondary | ICD-10-CM

## 2021-07-04 MED ORDER — DESVENLAFAXINE SUCCINATE ER 50 MG PO TB24: 50.0000 mg | ORAL_TABLET | Freq: Every day | ORAL | 0 refills | Status: DC

## 2021-07-04 NOTE — Telephone Encounter (Signed)
Requesting refill on Pristiq called to:  [id number] [address], [place]  Phone:  [phone]  Fax:  [phone]       [id number] [address], [place] Phone:  [phone]

## 2021-07-04 NOTE — Telephone Encounter (Signed)
Rx sent 

## 2021-07-14 ENCOUNTER — Encounter: Payer: Self-pay | Admitting: Internal Medicine

## 2021-07-18 ENCOUNTER — Other Ambulatory Visit: Payer: Self-pay

## 2021-07-18 ENCOUNTER — Ambulatory Visit: Payer: Medicare HMO | Attending: Internal Medicine | Admitting: Physical Therapy

## 2021-07-18 ENCOUNTER — Encounter: Payer: Self-pay | Admitting: Physical Therapy

## 2021-07-18 DIAGNOSIS — R42 Dizziness and giddiness: Secondary | ICD-10-CM | POA: Insufficient documentation

## 2021-07-18 DIAGNOSIS — H8113 Benign paroxysmal vertigo, bilateral: Secondary | ICD-10-CM | POA: Diagnosis not present

## 2021-07-18 NOTE — Therapy (Signed)
[facility] [address], [place], [place], [address] Phone: [phone]   Fax:  [phone]  Physical Therapy Evaluation  Patient Details  Name: Kimberly Conway MRN: [id number] Date of Birth: [birth year] Referring Provider (PT): Isaac Bliss, Rayford Halsted, MD   Encounter Date: 07/18/2021   PT End of Session - 07/18/21 1154     Visit Number 1    Number of Visits 13    Date for PT Re-Evaluation 08/29/21    Authorization Type [employer] Medicare    PT Start Time 1009    PT Stop Time 1054    PT Time Calculation (min) 45 min    Activity Tolerance Patient tolerated treatment well    Behavior During Therapy Anxious             Past Medical History:  Diagnosis Date   Anginal pain ([place])    Depression    Family history of adverse reaction to anesthesia    sister gets nauseous after anesthesia   GAD (generalized anxiety disorder)    GERD (gastroesophageal reflux disease)    Hepatitis B 1965   HTN (hypertension)    Multiple thyroid nodules    Other atopic dermatitis 08/17/2020   Seasonal allergies    Urticaria    Vertigo     Past Surgical History:  Procedure Laterality Date   APPENDECTOMY     COLONOSCOPY  08/28/2019   DILATION AND CURETTAGE OF UTERUS     IR 3D INDEPENDENT WKST  04/24/2021   IR ANGIO EXTERNAL CAROTID SEL EXT CAROTID UNI R MOD SED  04/24/2021   IR ANGIO INTRA EXTRACRAN SEL COM CAROTID INNOMINATE UNI R MOD SED  02/14/2021   IR ANGIO INTRA EXTRACRAN SEL INTERNAL CAROTID UNI L MOD SED  02/14/2021   IR ANGIO INTRA EXTRACRAN SEL INTERNAL CAROTID UNI R MOD SED  04/24/2021   IR ANGIO VERTEBRAL SEL SUBCLAVIAN INNOMINATE UNI R MOD SED  02/14/2021   IR ANGIO VERTEBRAL SEL VERTEBRAL UNI L MOD SED  02/14/2021   IR CT HEAD LTD  04/24/2021   IR PTA INTRACRANIAL  04/24/2021   IR TRANSCATH/EMBOLIZ  04/24/2021   IR US GUIDE VASC ACCESS RIGHT  02/14/2021   IR US GUIDE VASC ACCESS RIGHT  04/24/2021   RADIOLOGY WITH ANESTHESIA  N/A 04/24/2021   Procedure: EMBOLIZATION;  Surgeon: Pedro Earls, MD;  Location: [place];  Service: Radiology;  Laterality: N/A;   UPPER GI ENDOSCOPY  08/28/2019    There were no vitals filed for this visit.    Subjective Assessment - 07/18/21 1011     Subjective Patient reports dizziness for >10 years. It first began when driving- while in motion she was fine, but felt it when she stopped. Has not had an intense spinning episode in a year, however still has episodes of "near dizziness." Episodes last seconds-minutes and describes as lightheaded/"like I'm going to pass out" or "like I'm going to start spinning." Worse when laying down with tilting head back when putting in eye drops. Unable to be fully reclined at the dentist. Denies head trauma, infection/illness, vision changes/double vision, hearing loss, otalgia. Denies migraines but notes getting them as a child. Notes intermittent tinnitus. Reports a couple falls, one resulting in head trauma 5 years ago- still feels an ache in the R side of her head intermittently.    Pertinent History Depression and anxiety, GERD, R ICA aneurysm s/p endovascular treatment last year, on dual antiplatelet therapy, HTN. vertigo  Limitations Reading;Lifting;Standing;Walking;House hold activities    Diagnostic tests none recent    Patient Stated Goals improve dizziness    Currently in Pain? Other (Comment)   referral not related to pain               [facility] PT Assessment - 07/18/21 1018       Assessment   Medical Diagnosis Vertigo    Referring Provider (PT) Isaac Bliss, Rayford Halsted, MD    Onset Date/Surgical Date --   10 years   Next MD Visit not scheduled    Prior Therapy yes- for vertigo      Precautions   Precautions Fall   on blood thinners     Balance Screen   Has the patient fallen in the past 6 months No    Has the patient had a decrease in activity level because of a fear of falling?  Yes    Is the patient  reluctant to leave their home because of a fear of falling?  No      Home Environment   Living Environment Private residence    Living Arrangements Alone    Available Help at Discharge Family    Type of [place] to enter    Entrance Stairs-Number of Steps ~20    Entrance [place] One level      Prior Function   Level of Independence Independent    Vocation Full time employment    Vocation Requirements desk work    Leisure none                    Vestibular Assessment - 07/18/21 1024       Oculomotor Exam   Oculomotor Alignment Normal    Ocular ROM WNL    Spontaneous Absent    Gaze-induced  Absent    Smooth Pursuits Saccades   2-3 vertically   Saccades Intact    Comment convergence: <10 cm      Oculomotor Exam-Fixation Suppressed    Left Head Impulse intact    Right Head Impulse intact      Vestibulo-Ocular Reflex   VOR 1 Head Only (x 1 viewing) intact vertical and horizontal but with c/o "my head doesn't feel good" with horizontal    VOR Cancellation Normal   c/o mild dizziness     Positional Testing   Dix-Hallpike Dix-Hallpike Left;Dix-Hallpike Right    Horizontal Canal Testing Horizontal Canal Right;Horizontal Canal Left      Dix-Hallpike Right   Dix-Hallpike Right Duration 30-40 sec    Dix-Hallpike Right Symptoms Upbeat, right rotatory nystagmus   low amplitude; dizziness     Horizontal Canal Right   Horizontal Canal Right Duration 0    Horizontal Canal Right Symptoms Normal      Horizontal Canal Left   Horizontal Canal Left Duration 60    Horizontal Canal Left Symptoms Normal   c/o dizziness and possible couple beats of indiscernable nystagmus               Objective measurements completed on examination: See above findings.        Vestibular Treatment/Exercise - 07/18/21 0001       Vestibular Treatment/Exercise   Vestibular Treatment Provided Canalith Repositioning     Canalith Repositioning Epley Manuever Right       EPLEY MANUEVER RIGHT   Number of Reps  1    Overall Response Improved Symptoms  Response Details  tolerated well but c/o dizziness upon L head turn and c/o severe latent onset dizziness for ~10 sec upon sitting up; limited cervical extension                    PT Education - 07/18/21 1152     Education Details prognosis, POC, edu on BPPV etiology and labyrinthe anatomy, post-Epley precautions    Person(s) Educated Patient    Methods Explanation;Demonstration;Tactile cues;Verbal cues;Handout    Comprehension Verbalized understanding;Returned demonstration              PT Short Term Goals - 07/18/21 1201       PT SHORT TERM GOAL #1   Title Patient to be independent with initial HEP.    Time 3    Period Weeks    Status New    Target Date 08/08/21               PT Long Term Goals - 07/18/21 1202       PT LONG TERM GOAL #1   Title Patient to be independent with advanced HEP.    Time 6    Period Weeks    Status New    Target Date 08/29/21      PT LONG TERM GOAL #2   Title Patient to score at least 20/24 on DGI in order to decrease risk of falls.    Time 6    Period Weeks    Status New    Target Date 08/29/21      PT LONG TERM GOAL #3   Title Patient to report tolerance for applying eyedrops while supine without dizziness limiting.    Time 6    Period Weeks    Status New    Target Date 08/29/21      PT LONG TERM GOAL #4   Title Patient to report 0/10 dizziness with standing vertical and horizontal head movements at variable speeds.    Time 6    Period Weeks    Status New    Target Date 08/29/21                    Plan - 07/18/21 1155     Clinical Impression Statement Patient is a 66 y/o F presenting to OPPT with c/o fluctuating dizziness for the past 10 years. Dizziness lasts seconds-minutes and describes it as lightheaded/"like I'm going to pass out" or "like I'm going to start  spinning." Worse when laying down with tilting the head back when putting in eye drops or when laying at the dentist. Denies head trauma, infection/illness, vision changes/double vision, hearing loss, otalgia. Notes intermittent tinnitus. Patient today presenting with saccades with vertical smooth pursuit and dizziness with horizontal VOR and VOR cancellation. Positional testing was positive for R [facility] and patient also symptomatic with R roll test. Patient was treated with R Epley and was educated on BPPV and post-Epley precautions. Patient reported understanding. Would benefit from skilled PT services 1-2x/week for 6 weeks to address aforementioned impairments in order to optimize level of function.    Personal Factors and Comorbidities Age;Past/Current Experience;Behavior Pattern;Time since onset of injury/illness/exacerbation;Comorbidity 3+    Comorbidities Depression and anxiety, GERD, R ICA aneurysm s/p endovascular treatment last year, on dual antiplatelet therapy, HTN. vertigo    Examination-Activity Limitations Bathing;Locomotion Level;Bed Mobility;Reach Overhead;Bend;Sleep;Dressing;Hygiene/Grooming;Stairs;Stand    Examination-Participation Restrictions Laundry;Shop;Driving;Community Activity;Cleaning;Church;Meal Prep    Stability/Clinical Decision Making Evolving/Moderate complexity    Clinical Decision Making Moderate  Rehab Potential Good    PT Frequency Other (comment)   1-2x/week   PT Duration 6 weeks    PT Treatment/Interventions ADLs/Self Care Home Management;Canalith Repostioning;DME Instruction;Cryotherapy;Moist Heat;Gait training;Stair training;Functional mobility training;Therapeutic activities;Therapeutic exercise;Balance training;Neuromuscular re-education;Manual techniques;Patient/family education;Dry needling;Passive range of motion;Visual/perceptual remediation/compensation;Vestibular;Taping    PT Next Visit Plan reassess R/L DH, assess DGI, initiate habituation/VOR    Consulted  and Agree with Plan of Care Patient             Patient will benefit from skilled therapeutic intervention in order to improve the following deficits and impairments:  Dizziness, Decreased activity tolerance, Decreased balance  Visit Diagnosis: BPPV (benign paroxysmal positional vertigo), bilateral  Dizziness and giddiness     Problem List Patient Active Problem List   Diagnosis Date Noted   Brain aneurysm 04/24/2021   Allergic contact dermatitis 08/17/2020   Other atopic dermatitis 08/17/2020   Other allergic rhinitis 08/17/2020   Depression    HTN (hypertension)    Multiple thyroid nodules    Anxiety and depression 01/02/2019   Gastroesophageal reflux disease without esophagitis 01/02/2019   Seasonal allergies 01/02/2019    Janene Harvey, PT, DPT 07/18/21 12:06 PM   [facility] [address] [place], [place], [address] Phone: [phone]   Fax:  [phone]  Name: CATLYNN GRONDAHL MRN: [id number] Date of Birth: [birth year]

## 2021-07-25 ENCOUNTER — Other Ambulatory Visit: Payer: Self-pay

## 2021-07-25 ENCOUNTER — Encounter: Payer: Self-pay | Admitting: Physical Therapy

## 2021-07-25 ENCOUNTER — Ambulatory Visit: Payer: Medicare HMO | Admitting: Physical Therapy

## 2021-07-25 DIAGNOSIS — R42 Dizziness and giddiness: Secondary | ICD-10-CM

## 2021-07-25 DIAGNOSIS — H8113 Benign paroxysmal vertigo, bilateral: Secondary | ICD-10-CM | POA: Diagnosis not present

## 2021-07-25 NOTE — Therapy (Signed)
[facility] [address] [place], [place], [address] Phone: [phone]   Fax:  [phone]  Physical Therapy Treatment  Patient Details  Name: Kimberly Conway MRN: [id number] Date of Birth: [birth year] Referring Provider (PT): Isaac Bliss, Rayford Halsted, MD   Encounter Date: 07/25/2021   PT End of Session - 07/25/21 1113     Visit Number 2    Number of Visits 13    Date for PT Re-Evaluation 08/29/21    Authorization Type [employer]    Authorization Time Period Cohere approval 12 visits 07/18/2021-08/29/2021    Authorization - Visit Number 2    Authorization - Number of Visits 12    PT Start Time 1012    PT Stop Time 1108    PT Time Calculation (min) 56 min    Equipment Utilized During Treatment Gait belt    Activity Tolerance Patient tolerated treatment well;Other (comment)   limited by dizziness, anxiety   Behavior During Therapy Anxious             Past Medical History:  Diagnosis Date   Anginal pain ([place])    Depression    Family history of adverse reaction to anesthesia    sister gets nauseous after anesthesia   GAD (generalized anxiety disorder)    GERD (gastroesophageal reflux disease)    Hepatitis B 1965   HTN (hypertension)    Multiple thyroid nodules    Other atopic dermatitis 08/17/2020   Seasonal allergies    Urticaria    Vertigo     Past Surgical History:  Procedure Laterality Date   APPENDECTOMY     COLONOSCOPY  08/28/2019   DILATION AND CURETTAGE OF UTERUS     IR 3D INDEPENDENT WKST  04/24/2021   IR ANGIO EXTERNAL CAROTID SEL EXT CAROTID UNI R MOD SED  04/24/2021   IR ANGIO INTRA EXTRACRAN SEL COM CAROTID INNOMINATE UNI R MOD SED  02/14/2021   IR ANGIO INTRA EXTRACRAN SEL INTERNAL CAROTID UNI L MOD SED  02/14/2021   IR ANGIO INTRA EXTRACRAN SEL INTERNAL CAROTID UNI R MOD SED  04/24/2021   IR ANGIO VERTEBRAL SEL SUBCLAVIAN INNOMINATE UNI R MOD SED  02/14/2021   IR ANGIO VERTEBRAL SEL  VERTEBRAL UNI L MOD SED  02/14/2021   IR CT HEAD LTD  04/24/2021   IR PTA INTRACRANIAL  04/24/2021   IR TRANSCATH/EMBOLIZ  04/24/2021   IR US GUIDE VASC ACCESS RIGHT  02/14/2021   IR US GUIDE VASC ACCESS RIGHT  04/24/2021   RADIOLOGY WITH ANESTHESIA N/A 04/24/2021   Procedure: EMBOLIZATION;  Surgeon: Pedro Earls, MD;  Location: [facility];  Service: Radiology;  Laterality: N/A;   UPPER GI ENDOSCOPY  08/28/2019    There were no vitals filed for this visit.   Subjective Assessment - 07/25/21 1009     Subjective Feeling better. Notes still some remaining dizziness when putting in eye drops and when rolling in bed.    Pertinent History Depression and anxiety, GERD, R ICA aneurysm s/p endovascular treatment last year, on dual antiplatelet therapy, HTN. vertigo    Diagnostic tests none recent    Patient Stated Goals improve dizziness    Currently in Pain? Other (Comment)   referral unrelated to pain               [facility] PT Assessment - 07/25/21 0001       Standardized Balance Assessment   Standardized Balance Assessment Dynamic Gait Index  Dynamic Gait Index   Level Surface Normal    Change in Gait Speed Normal    Gait with Horizontal Head Turns Moderate Impairment    Gait with Vertical Head Turns Moderate Impairment    Gait and Pivot Turn Mild Impairment    Step Over Obstacle Normal    Step Around Obstacles Mild Impairment    Steps Mild Impairment    Total Score 17    DGI comment: c/o slight dizziness                 Vestibular Assessment - 07/25/21 0001       Dix-Hallpike Right   Dix-Hallpike Right Duration --   5 secs before patient came back up to sit d/t poor tolerance and anxiety x3   Dix-Hallpike Right Symptoms Upbeat, right rotatory nystagmus                       Vestibular Treatment/Exercise - 07/25/21 0001       Vestibular Treatment/Exercise   Habituation Exercises Nestor Lewandowsky    Gaze Exercises X1 Viewing  Horizontal;X1 Viewing Vertical;Eye/Head Exercise Horizontal      Nestor Lewandowsky   Number of Reps  1    Symptom Description  3-5 sec of L upbeating torsional nystagmus upon laying L and 3-5 sec of R upbeating torsional nystagmus upon laying R   c/o worse dizziness on R side     X1 Viewing Horizontal   Foot Position sitting, romberg    Reps --   30" each   Comments 0/10 dizziness; mild imbalance      Eye/Head Exercise Horizontal   Foot Position romberg    Comments VOR cancellation; c/o slight wooziness                Balance Exercises - 07/25/21 0001       Balance Exercises: Standing   Standing Eyes Opened Foam/compliant surface;Wide (BOA);1 rep;30 secs    Standing Eyes Closed Foam/compliant surface;1 rep;30 secs;Wide (BOA)    Other Standing Exercises Comments EC head turns and head nods 30"   no dizziness               PT Education - 07/25/21 1110     Education Details HEP- Access Code: [email or web address]; advised patient to sit in waiting room until symptoms settle and suggested setting up a ride to safely return home; provided work note stating that patient is in PT for vertigo    Person(s) Educated Patient    Methods Explanation;Demonstration;Tactile cues;Verbal cues;Handout    Comprehension Verbalized understanding;Returned demonstration              PT Short Term Goals - 07/25/21 1119       PT SHORT TERM GOAL #1   Title Patient to be independent with initial HEP.    Time 3    Period Weeks    Status On-going    Target Date 08/08/21               PT Long Term Goals - 07/25/21 1119       PT LONG TERM GOAL #1   Title Patient to be independent with advanced HEP.    Time 6    Period Weeks    Status On-going    Target Date 08/29/21      PT LONG TERM GOAL #2   Title Patient to score at least 20/24 on DGI in order to decrease risk of falls.    Time  6    Period Weeks    Status On-going    Target Date 08/29/21      PT LONG TERM GOAL #3   Title  Patient to report tolerance for applying eyedrops while supine without dizziness limiting.    Time 6    Period Weeks    Status On-going    Target Date 08/29/21      PT LONG TERM GOAL #4   Title Patient to report 0/10 dizziness with standing vertical and horizontal head movements at variable speeds.    Time 6    Period Weeks    Status On-going    Target Date 08/29/21                   Plan - 07/25/21 1114     Clinical Impression Statement Patient arrived to session with report of improved dizziness but some remaining symptoms when putting in eye drops and rolling in bed. Upon discussion, patient reports visual vertigo symptoms including sensitivity to patterns and quick movements on TV. Patient scored 17/24 on DGI, indicating an increased risk of falls. Worked on VOR and static balance with vision and somatosensory inputs removed, which demonstrated minimal dizziness and mild-moderate sway. Initiated Nestor Lewandowsky which revealed remaining R and L posterior canalithiasis; R side was most symptomatic, thus proceeded with R DH/Epley however patient could not tolerate d/y anxiety and intensity of symptoms. Provided HEP with exercises that were well-tolerated today and plan to try repositioning next session if able. Advised patient to sit in waiting room until symptoms settle and suggested setting up a ride to safely return home.    Personal Factors and Comorbidities Age;Past/Current Experience;Behavior Pattern;Time since onset of injury/illness/exacerbation;Comorbidity 3+    Comorbidities Depression and anxiety, GERD, R ICA aneurysm s/p endovascular treatment last year, on dual antiplatelet therapy, HTN. vertigo    Examination-Activity Limitations Bathing;Locomotion Level;Bed Mobility;Reach Overhead;Bend;Sleep;Dressing;Hygiene/Grooming;Stairs;Stand    Examination-Participation Restrictions Laundry;Shop;Driving;Community Activity;Cleaning;Church;Meal Prep    Stability/Clinical Decision  Making Evolving/Moderate complexity    Rehab Potential Good    PT Frequency Other (comment)   1-2x/week   PT Duration 6 weeks    PT Treatment/Interventions ADLs/Self Care Home Management;Canalith Repostioning;DME Instruction;Cryotherapy;Moist Heat;Gait training;Stair training;Functional mobility training;Therapeutic activities;Therapeutic exercise;Balance training;Neuromuscular re-education;Manual techniques;Patient/family education;Dry needling;Passive range of motion;Visual/perceptual remediation/compensation;Vestibular;Taping    PT Next Visit Plan reassess R/L DH, and HEP; progress VOR and sensory reweighting    Consulted and Agree with Plan of Care Patient             Patient will benefit from skilled therapeutic intervention in order to improve the following deficits and impairments:  Dizziness, Decreased activity tolerance, Decreased balance  Visit Diagnosis: BPPV (benign paroxysmal positional vertigo), bilateral  Dizziness and giddiness     Problem List Patient Active Problem List   Diagnosis Date Noted   Brain aneurysm 04/24/2021   Allergic contact dermatitis 08/17/2020   Other atopic dermatitis 08/17/2020   Other allergic rhinitis 08/17/2020   Depression    HTN (hypertension)    Multiple thyroid nodules    Anxiety and depression 01/02/2019   Gastroesophageal reflux disease without esophagitis 01/02/2019   Seasonal allergies 01/02/2019    Janene Harvey, PT, DPT 07/25/21 11:20 AM   [facility] [address], [place], [place], [address] Phone: [phone]   Fax:  [phone]  Name: Kimberly Conway MRN: [id number] Date of Birth: 1956/04/10

## 2021-07-30 ENCOUNTER — Other Ambulatory Visit: Payer: Self-pay | Admitting: Internal Medicine

## 2021-08-01 ENCOUNTER — Ambulatory Visit: Payer: Medicare HMO | Admitting: Physical Therapy

## 2021-08-01 ENCOUNTER — Other Ambulatory Visit: Payer: Self-pay

## 2021-08-01 ENCOUNTER — Encounter: Payer: Self-pay | Admitting: Physical Therapy

## 2021-08-01 DIAGNOSIS — H8113 Benign paroxysmal vertigo, bilateral: Secondary | ICD-10-CM

## 2021-08-01 DIAGNOSIS — R42 Dizziness and giddiness: Secondary | ICD-10-CM

## 2021-08-01 NOTE — Therapy (Signed)
[facility] [address] [place], [place], [address] Phone: [phone]   Fax:  [phone]  Physical Therapy Treatment  Patient Details  Name: Kimberly Conway MRN: [id number] Date of Birth: 1956/05/26 Referring Provider (PT): Isaac Bliss, Rayford Halsted, MD   Encounter Date: 08/01/2021   PT End of Session - 08/01/21 1104     Visit Number 3    Number of Visits 13    Date for PT Re-Evaluation 08/29/21    Authorization Type [employer]    Authorization Time Period Cohere approval 12 visits 07/18/2021-08/29/2021    Authorization - Visit Number 3    Authorization - Number of Visits 12    PT Start Time 1017    PT Stop Time 1055    PT Time Calculation (min) 38 min    Equipment Utilized During Treatment Gait belt    Activity Tolerance Patient tolerated treatment well;Other (comment)   limited by dizziness, anxiety   Behavior During Therapy Anxious             Past Medical History:  Diagnosis Date   Anginal pain ([place])    Depression    Family history of adverse reaction to anesthesia    sister gets nauseous after anesthesia   GAD (generalized anxiety disorder)    GERD (gastroesophageal reflux disease)    Hepatitis B 1965   HTN (hypertension)    Multiple thyroid nodules    Other atopic dermatitis 08/17/2020   Seasonal allergies    Urticaria    Vertigo     Past Surgical History:  Procedure Laterality Date   APPENDECTOMY     COLONOSCOPY  08/28/2019   DILATION AND CURETTAGE OF UTERUS     IR 3D INDEPENDENT WKST  04/24/2021   IR ANGIO EXTERNAL CAROTID SEL EXT CAROTID UNI R MOD SED  04/24/2021   IR ANGIO INTRA EXTRACRAN SEL COM CAROTID INNOMINATE UNI R MOD SED  02/14/2021   IR ANGIO INTRA EXTRACRAN SEL INTERNAL CAROTID UNI L MOD SED  02/14/2021   IR ANGIO INTRA EXTRACRAN SEL INTERNAL CAROTID UNI R MOD SED  04/24/2021   IR ANGIO VERTEBRAL SEL SUBCLAVIAN INNOMINATE UNI R MOD SED  02/14/2021   IR ANGIO VERTEBRAL SEL  VERTEBRAL UNI L MOD SED  02/14/2021   IR CT HEAD LTD  04/24/2021   IR PTA INTRACRANIAL  04/24/2021   IR TRANSCATH/EMBOLIZ  04/24/2021   IR US GUIDE VASC ACCESS RIGHT  02/14/2021   IR US GUIDE VASC ACCESS RIGHT  04/24/2021   RADIOLOGY WITH ANESTHESIA N/A 04/24/2021   Procedure: EMBOLIZATION;  Surgeon: Pedro Earls, MD;  Location: [facility];  Service: Radiology;  Laterality: N/A;   UPPER GI ENDOSCOPY  08/28/2019    There were no vitals filed for this visit.   Subjective Assessment - 08/01/21 1019     Subjective Reports that she is not able to tolerate the laying down exercise because she gets intense vertigo. The other exercise is fine.    Pertinent History Depression and anxiety, GERD, R ICA aneurysm s/p endovascular treatment last year, on dual antiplatelet therapy, HTN. vertigo    Diagnostic tests none recent    Patient Stated Goals improve dizziness    Currently in Pain? Other (Comment)                     Vestibular Assessment - 08/01/21 0001       Dix-Hallpike Right   Dix-Hallpike Right Duration 5  sec    Dix-Hallpike Right Symptoms Upbeat, right rotatory nystagmus   unable to tolerate testing                      Vestibular Treatment/Exercise - 08/01/21 0001       Vestibular Treatment/Exercise   Habituation Exercises Laruth Bouchard Daroff;Comment   sit>supine with pillow under head 3x     Nestor Lewandowsky   Number of Reps  3    Symptom Description  EO with 2 pillows under head- c/o mild dizziness; able to increase speed with subsequent reps      X1 Viewing Horizontal   Foot Position wide stance and romberg on foam    Reps --   30"   Comments 0/10 dizziness; mild-moderate imbalance      Eye/Head Exercise Horizontal   Foot Position wide stance and romberg on foam    Reps --   30"   Comments VOR cancellation; no dizziness; moderate imbalane                Balance Exercises - 08/01/21 0001       Balance Exercises: Standing    Standing Eyes Closed Foam/compliant surface;30 secs;Wide (BOA);2 reps   2nd set with R/L head turns               PT Education - 08/01/21 1103     Education Details update to HEP- Access Code: [email or web address]; discussion on benefits of habituation to improve tolerance for avoided positions and encouraged patient to speak to her mental health provider to address fear/anxiety    Person(s) Educated Patient    Methods Explanation;Demonstration;Tactile cues;Verbal cues;Handout    Comprehension Returned demonstration;Verbalized understanding              PT Short Term Goals - 08/01/21 1108       PT SHORT TERM GOAL #1   Title Patient to be independent with initial HEP.    Time 3    Period Weeks    Status Achieved    Target Date 08/08/21               PT Long Term Goals - 07/25/21 1119       PT LONG TERM GOAL #1   Title Patient to be independent with advanced HEP.    Time 6    Period Weeks    Status On-going    Target Date 08/29/21      PT LONG TERM GOAL #2   Title Patient to score at least 20/24 on DGI in order to decrease risk of falls.    Time 6    Period Weeks    Status On-going    Target Date 08/29/21      PT LONG TERM GOAL #3   Title Patient to report tolerance for applying eyedrops while supine without dizziness limiting.    Time 6    Period Weeks    Status On-going    Target Date 08/29/21      PT LONG TERM GOAL #4   Title Patient to report 0/10 dizziness with standing vertical and horizontal head movements at variable speeds.    Time 6    Period Weeks    Status On-going    Target Date 08/29/21                   Plan - 08/01/21 1105     Clinical Impression Statement Patient arrived to session with report of intense dizziness with Laruth Bouchard  Daroff. Worked on modifying this exercise as well as sit>supine as patient could not tolerate DH again today. Patient was able to tolerate Nestor Lewandowsky with 2 pillows and sit>supine with 1 pillow under head  with c/o mild dizziness and good tolerance. Progressed VOR activities on foam today with patient demonstrating mild-moderate imbalance requiring cues to decrease speed of head movements, but without c/o dizziness. Encouraged patient to maintain compliance with HEP for improved tolerance for these activities. Patient reported understanding and without complaints at end  of session.    Personal Factors and Comorbidities Age;Past/Current Experience;Behavior Pattern;Time since onset of injury/illness/exacerbation;Comorbidity 3+    Comorbidities Depression and anxiety, GERD, R ICA aneurysm s/p endovascular treatment last year, on dual antiplatelet therapy, HTN. vertigo    Examination-Activity Limitations Bathing;Locomotion Level;Bed Mobility;Reach Overhead;Bend;Sleep;Dressing;Hygiene/Grooming;Stairs;Stand    Examination-Participation Restrictions Laundry;Shop;Driving;Community Activity;Cleaning;Church;Meal Prep    Stability/Clinical Decision Making Evolving/Moderate complexity    Rehab Potential Good    PT Frequency Other (comment)   1-2x/week   PT Duration 6 weeks    PT Treatment/Interventions ADLs/Self Care Home Management;Canalith Repostioning;DME Instruction;Cryotherapy;Moist Heat;Gait training;Stair training;Functional mobility training;Therapeutic activities;Therapeutic exercise;Balance training;Neuromuscular re-education;Manual techniques;Patient/family education;Dry needling;Passive range of motion;Visual/perceptual remediation/compensation;Vestibular;Taping    PT Next Visit Plan reassess HEP; progress VOR, habituation, and sensory reweighting    Consulted and Agree with Plan of Care Patient             Patient will benefit from skilled therapeutic intervention in order to improve the following deficits and impairments:  Dizziness, Decreased activity tolerance, Decreased balance  Visit Diagnosis: BPPV (benign paroxysmal positional vertigo), bilateral  Dizziness and  giddiness     Problem List Patient Active Problem List   Diagnosis Date Noted   Brain aneurysm 04/24/2021   Allergic contact dermatitis 08/17/2020   Other atopic dermatitis 08/17/2020   Other allergic rhinitis 08/17/2020   Depression    HTN (hypertension)    Multiple thyroid nodules    Anxiety and depression 01/02/2019   Gastroesophageal reflux disease without esophagitis 01/02/2019   Seasonal allergies 01/02/2019    Janene Harvey, PT, DPT 08/01/21 11:09 AM   [facility] [address] [place], [place], [address] Phone: [phone]   Fax:  [phone]  Name: MYNA FREIMARK MRN: [id number] Date of Birth: Dec 20, 1955

## 2021-08-08 ENCOUNTER — Ambulatory Visit: Payer: Medicare HMO | Admitting: Physical Therapy

## 2021-08-12 ENCOUNTER — Other Ambulatory Visit: Payer: Self-pay | Admitting: Student

## 2021-08-12 MED ORDER — TICAGRELOR 90 MG PO TABS: 90.0000 mg | ORAL_TABLET | Freq: Two times a day (BID) | ORAL | 3 refills | Status: DC

## 2021-08-15 ENCOUNTER — Ambulatory Visit: Payer: Medicare HMO | Admitting: Physical Therapy

## 2021-08-22 ENCOUNTER — Ambulatory Visit: Payer: Medicare HMO | Attending: Internal Medicine | Admitting: Physical Therapy

## 2021-08-22 ENCOUNTER — Encounter: Payer: Self-pay | Admitting: Physical Therapy

## 2021-08-22 ENCOUNTER — Other Ambulatory Visit: Payer: Self-pay

## 2021-08-22 DIAGNOSIS — H8113 Benign paroxysmal vertigo, bilateral: Secondary | ICD-10-CM | POA: Insufficient documentation

## 2021-08-22 DIAGNOSIS — R42 Dizziness and giddiness: Secondary | ICD-10-CM | POA: Insufficient documentation

## 2021-08-22 NOTE — Therapy (Signed)
[place] ?[facility] ?[address], [address] ?[place], [place], [address] ?Phone: [phone]   Fax:  [phone] ? ?Physical Therapy Discharge Summary ? ?Patient Details  ?Name: Kimberly Conway ?MRN: [id number] ?Date of Birth: Jan 01, 1956 ?Referring Provider (PT): Isaac Bliss, Rayford Halsted, MD ? ? ?Progress Note ?Reporting Period 07/18/21 to 08/22/21 ? ?See note below for Objective Data and Assessment of Progress/Goals.  ? ? ?Encounter Date: 08/22/2021 ? ? PT End of Session - 08/22/21 1059   ? ? Visit Number 4   ? Number of Visits 13   ? Date for PT Re-Evaluation 08/29/21   ? Authorization Type [employer]   ? Authorization Time Period Cohere approval 12 visits 07/18/2021-08/29/2021   ? Authorization - Visit Number 4   ? Authorization - Number of Visits 12   ? PT Start Time 1017   ? PT Stop Time 1055   ? PT Time Calculation (min) 38 min   ? Equipment Utilized During Treatment Gait belt   ? Activity Tolerance Patient tolerated treatment well   ? ?  ?  ? ?  ? ? ?Past Medical History:  ?Diagnosis Date  ? Anginal pain ([place])   ? Depression   ? Family history of adverse reaction to anesthesia   ? sister gets nauseous after anesthesia  ? GAD (generalized anxiety disorder)   ? GERD (gastroesophageal reflux disease)   ? Hepatitis B 1965  ? HTN (hypertension)   ? Multiple thyroid nodules   ? Other atopic dermatitis 08/17/2020  ? Seasonal allergies   ? Urticaria   ? Vertigo   ? ? ?Past Surgical History:  ?Procedure Laterality Date  ? APPENDECTOMY    ? COLONOSCOPY  08/28/2019  ? DILATION AND CURETTAGE OF UTERUS    ? IR 3D INDEPENDENT WKST  04/24/2021  ? IR ANGIO EXTERNAL CAROTID SEL EXT CAROTID UNI R MOD SED  04/24/2021  ? IR ANGIO INTRA EXTRACRAN SEL COM CAROTID INNOMINATE UNI R MOD SED  02/14/2021  ? IR ANGIO INTRA EXTRACRAN SEL INTERNAL CAROTID UNI L MOD SED  02/14/2021  ? IR ANGIO INTRA EXTRACRAN SEL INTERNAL CAROTID UNI R MOD SED  04/24/2021  ? IR ANGIO VERTEBRAL SEL SUBCLAVIAN INNOMINATE UNI R  MOD SED  02/14/2021  ? IR ANGIO VERTEBRAL SEL VERTEBRAL UNI L MOD SED  02/14/2021  ? IR CT HEAD LTD  04/24/2021  ? IR PTA INTRACRANIAL  04/24/2021  ? IR TRANSCATH/EMBOLIZ  04/24/2021  ? IR US GUIDE VASC ACCESS RIGHT  02/14/2021  ? IR US GUIDE VASC ACCESS RIGHT  04/24/2021  ? RADIOLOGY WITH ANESTHESIA N/A 04/24/2021  ? Procedure: EMBOLIZATION;  Surgeon: Pedro Earls, MD;  Location: [facility];  Service: Radiology;  Laterality: N/A;  ? UPPER GI ENDOSCOPY  08/28/2019  ? ? ?There were no vitals filed for this visit. ? ? Subjective Assessment - 08/22/21 1018   ? ? Subjective Denies episodes of intense vertigo, just dizziness. Has been practicing those exercises.   ? Pertinent History Depression and anxiety, GERD, R ICA aneurysm s/p endovascular treatment last year, on dual antiplatelet therapy, HTN. vertigo   ? Diagnostic tests none recent   ? Patient Stated Goals improve dizziness   ? ?  ?  ? ?  ? ? ? ? ? [facility] PT Assessment - 08/22/21 0001   ? ?  ? Dynamic Gait Index  ? Level Surface Normal   ? Change in Gait Speed Normal   ? Gait with  Horizontal Head Turns Mild Impairment   ? Gait with Vertical Head Turns Normal   ? Gait and Pivot Turn Normal   ? Step Over Obstacle Normal   ? Step Around Obstacles Normal   ? Steps Mild Impairment   ? Total Score 22   ? ?  ?  ? ?  ? ? ? ? ? ? ? ? ? ? ? ? ? ? ? ? [facility] Adult PT Treatment/Exercise - 08/22/21 0001   ? ?  ? Neuro Re-ed   ? Neuro Re-ed Details  ant/pos wt shift 10x   ? ?  ?  ? ?  ? ? Vestibular Treatment/Exercise - 08/22/21 0001   ? ?  ? Vestibular Treatment/Exercise  ? Habituation Exercises Laruth Bouchard Daroff;Comment   ? Gaze Exercises --   long sitting to supine with 1 pillow 2x with cues to increase speed; able to extend neck back as if putting in eyedrops without complaints  ?  ? Laruth Bouchard Daroff  ? Number of Reps  2   ? Symptom Description  EO with 2 and 1 pillow under head- no c/o dizziness   ?  ? X1 Viewing Horizontal  ? Foot Position standing, standing on foam   ?  Reps --   30" x2  ? Comments 0/10 dizziness; posterior instability when on foam; required cueing to maintain gaze stability   ?  ? Eye/Head Exercise Horizontal  ? Foot Position wide stance on foam   ? Reps --   30"  ? Comments EC head turns/nods   cues to decrease speed to improve stability  ? ?  ?  ? ?  ? ? ? ? ? ? ? ? ? PT Education - 08/22/21 1059   ? ? Education Details discussion on objective progress and remaining impairments; update to HEP-Access Code: [email or web address]   ? Person(s) Educated Patient   ? Methods Explanation;Demonstration;Tactile cues;Verbal cues;Handout   ? Comprehension Verbalized understanding;Returned demonstration   ? ?  ?  ? ?  ? ? ? PT Short Term Goals - 08/22/21 1100   ? ?  ? PT SHORT TERM GOAL #1  ? Title Patient to be independent with initial HEP.   ? Time 3   ? Period Weeks   ? Status Achieved   ? Target Date 08/08/21   ? ?  ?  ? ?  ? ? ? ? PT Long Term Goals - 08/22/21 1028   ? ?  ? PT LONG TERM GOAL #1  ? Title Patient to be independent with advanced HEP.   ? Time 6   ? Period Weeks   ? Status Achieved   ? Target Date 08/29/21   ?  ? PT LONG TERM GOAL #2  ? Title Patient to score at least 20/24 on DGI in order to decrease risk of falls.   ? Time 6   ? Period Weeks   ? Status Achieved   ? Target Date 08/29/21   ?  ? PT LONG TERM GOAL #3  ? Title Patient to report tolerance for applying eyedrops while supine without dizziness limiting.   ? Time 6   ? Period Weeks   ? Status Partially Met   reports occasional dizziness if doing this quickly  ? Target Date 08/29/21   ?  ? PT LONG TERM GOAL #4  ? Title Patient to report 0/10 dizziness with standing vertical and horizontal head movements at variable speeds.   ? Time 6   ?  Period Weeks   ? Status Achieved   ? Target Date 08/29/21   ? ?  ?  ? ?  ? ? ? ? ? ? ? ? Plan - 08/22/21 1100   ? ? Clinical Impression Statement Patient arrived to session with report of no longer having episodes of intense vertigo, only experiencing mild dizziness at times.  Patient scored 22/24 on DGI, indicating a decreased risk of falls. Able to perform VOR with 0/10 dizziness today. Able to simulate putting in eye drops in supine with patient tolerating this well. Reviewed HEP which revealed remaining sway with balance exercise on foam and still with some hesitation with habituation but without c/o symptoms. Spoke with patient about increasing speed and decreasing height of pillows to increase challenge with habituation activities. Patient reported understanding. At this time patient has met or partially met all goals and is ready for D/C with transition to HEP.   ? Personal Factors and Comorbidities Age;Past/Current Experience;Behavior Pattern;Time since onset of injury/illness/exacerbation;Comorbidity 3+   ? Comorbidities Depression and anxiety, GERD, R ICA aneurysm s/p endovascular treatment last year, on dual antiplatelet therapy, HTN. vertigo   ? Examination-Activity Limitations Bathing;Locomotion Level;Bed Mobility;Reach Overhead;Bend;Sleep;Dressing;Hygiene/Grooming;Stairs;Stand   ? Examination-Participation Restrictions Laundry;Shop;Driving;Community Activity;Cleaning;Church;Meal Prep   ? Stability/Clinical Decision Making Evolving/Moderate complexity   ? Rehab Potential Good   ? PT Frequency Other (comment)   1-2x/week  ? PT Duration 6 weeks   ? PT Treatment/Interventions ADLs/Self Care Home Management;Canalith Repostioning;DME Instruction;Cryotherapy;Moist Heat;Gait training;Stair training;Functional mobility training;Therapeutic activities;Therapeutic exercise;Balance training;Neuromuscular re-education;Manual techniques;Patient/family education;Dry needling;Passive range of motion;Visual/perceptual remediation/compensation;Vestibular;Taping   ? PT Next Visit Plan DC at this time   ? Consulted and Agree with Plan of Care Patient   ? ?  ?  ? ?  ? ? ?Patient will benefit from skilled therapeutic intervention in order to improve the following deficits and impairments:   Dizziness, Decreased activity tolerance, Decreased balance ? ?Visit Diagnosis: ?BPPV (benign paroxysmal positional vertigo), bilateral ? ?Dizziness and giddiness ? ? ? ? ?Problem List ?Patient Active Probl

## 2021-10-01 ENCOUNTER — Other Ambulatory Visit: Payer: Self-pay | Admitting: Internal Medicine

## 2021-10-01 DIAGNOSIS — J309 Allergic rhinitis, unspecified: Secondary | ICD-10-CM

## 2021-10-02 ENCOUNTER — Other Ambulatory Visit: Payer: Self-pay | Admitting: Adult Health

## 2021-10-02 DIAGNOSIS — F331 Major depressive disorder, recurrent, moderate: Secondary | ICD-10-CM

## 2021-10-02 DIAGNOSIS — F411 Generalized anxiety disorder: Secondary | ICD-10-CM

## 2021-10-28 ENCOUNTER — Telehealth (HOSPITAL_COMMUNITY): Payer: Self-pay

## 2021-10-28 ENCOUNTER — Other Ambulatory Visit (HOSPITAL_COMMUNITY): Payer: Self-pay | Admitting: Neuroradiology

## 2021-10-28 DIAGNOSIS — R42 Dizziness and giddiness: Secondary | ICD-10-CM

## 2021-10-28 DIAGNOSIS — I671 Cerebral aneurysm, nonruptured: Secondary | ICD-10-CM

## 2021-10-28 NOTE — Telephone Encounter (Signed)
Called to schedule diagnostic angiogram, no answer, left vm. AW  

## 2021-10-29 ENCOUNTER — Encounter: Payer: Self-pay | Admitting: Internal Medicine

## 2021-10-29 NOTE — Telephone Encounter (Signed)
Placed in Dr Hernandez's folder 

## 2021-11-12 ENCOUNTER — Other Ambulatory Visit (HOSPITAL_COMMUNITY): Payer: Self-pay | Admitting: Physician Assistant

## 2021-11-12 DIAGNOSIS — Z01818 Encounter for other preprocedural examination: Secondary | ICD-10-CM

## 2021-11-12 DIAGNOSIS — I671 Cerebral aneurysm, nonruptured: Secondary | ICD-10-CM

## 2021-11-13 ENCOUNTER — Other Ambulatory Visit: Payer: Self-pay | Admitting: Radiology

## 2021-11-14 ENCOUNTER — Other Ambulatory Visit (HOSPITAL_COMMUNITY): Payer: Self-pay | Admitting: Neuroradiology

## 2021-11-14 ENCOUNTER — Other Ambulatory Visit: Payer: Self-pay

## 2021-11-14 ENCOUNTER — Ambulatory Visit (HOSPITAL_COMMUNITY)
Admission: RE | Admit: 2021-11-14 | Discharge: 2021-11-14 | Disposition: A | Discharge status: Home / Self Care | Payer: Medicare HMO | Source: Ambulatory Visit | Attending: Neuroradiology | Admitting: Neuroradiology

## 2021-11-14 ENCOUNTER — Encounter (HOSPITAL_COMMUNITY): Payer: Self-pay

## 2021-11-14 ENCOUNTER — Encounter: Payer: Self-pay | Admitting: Internal Medicine

## 2021-11-14 DIAGNOSIS — Z9889 Other specified postprocedural states: Secondary | ICD-10-CM | POA: Diagnosis not present

## 2021-11-14 DIAGNOSIS — Z87891 Personal history of nicotine dependence: Secondary | ICD-10-CM | POA: Diagnosis not present

## 2021-11-14 DIAGNOSIS — I671 Cerebral aneurysm, nonruptured: Secondary | ICD-10-CM | POA: Insufficient documentation

## 2021-11-14 DIAGNOSIS — R42 Dizziness and giddiness: Secondary | ICD-10-CM | POA: Diagnosis not present

## 2021-11-14 DIAGNOSIS — Z01818 Encounter for other preprocedural examination: Secondary | ICD-10-CM

## 2021-11-14 HISTORY — PX: IR ANGIO INTRA EXTRACRAN SEL COM CAROTID INNOMINATE BILAT MOD SED: IMG5360

## 2021-11-14 HISTORY — PX: IR US GUIDE VASC ACCESS RIGHT: IMG2390

## 2021-11-14 HISTORY — PX: IR ANGIO VERTEBRAL SEL VERTEBRAL UNI L MOD SED: IMG5367

## 2021-11-14 LAB — CBC WITH DIFFERENTIAL/PLATELET
Abs Immature Granulocytes: 0.01 10*3/uL (ref 0.00–0.07)
Basophils Absolute: 0 10*3/uL (ref 0.0–0.1)
Basophils Relative: 1 %
Eosinophils Absolute: 0.2 10*3/uL (ref 0.0–0.5)
Eosinophils Relative: 4 %
HCT: 42 % (ref 36.0–46.0)
Hemoglobin: 14.2 g/dL (ref 12.0–15.0)
Immature Granulocytes: 0 %
Lymphocytes Relative: 26 %
Lymphs Abs: 1.5 10*3/uL (ref 0.7–4.0)
MCH: 30.9 pg (ref 26.0–34.0)
MCHC: 33.8 g/dL (ref 30.0–36.0)
MCV: 91.5 fL (ref 80.0–100.0)
Monocytes Absolute: 0.4 10*3/uL (ref 0.1–1.0)
Monocytes Relative: 7 %
Neutro Abs: 3.6 10*3/uL (ref 1.7–7.7)
Neutrophils Relative %: 62 %
Platelets: 311 10*3/uL (ref 150–400)
RBC: 4.59 MIL/uL (ref 3.87–5.11)
RDW: 13.1 % (ref 11.5–15.5)
WBC: 5.7 10*3/uL (ref 4.0–10.5)
nRBC: 0 % (ref 0.0–0.2)

## 2021-11-14 LAB — BASIC METABOLIC PANEL
Anion gap: 10 (ref 5–15)
BUN: 12 mg/dL (ref 8–23)
CO2: 24 mmol/L (ref 22–32)
Calcium: 9.7 mg/dL (ref 8.9–10.3)
Chloride: 106 mmol/L (ref 98–111)
Creatinine, Ser: 0.89 mg/dL (ref 0.44–1.00)
GFR, Estimated: >60 mL/min (ref >60)
Glucose, Bld: 104 mg/dL — ABNORMAL HIGH (ref 70–99)
Potassium: 3.7 mmol/L (ref 3.5–5.1)
Sodium: 140 mmol/L (ref 135–145)

## 2021-11-14 LAB — PROTIME-INR
INR: 0.9 (ref 0.8–1.2)
Prothrombin Time: 12.5 seconds (ref 11.4–15.2)

## 2021-11-14 IMAGING — XA IR ANGIO INTRA EXTRACRAN SEL COM CAROTID INNOMINATE BILAT MOD SE
9 of 11 series · 11 of 24 positions shown · IV contrast (IODINE)
Comparison: Cerebral angiogram [DATE] and [DATE].

INDICATION: MORENOH is a 65-year-old female with a past medical history
significant for depression and vertigo who presented to the
emergency department in [DATE] due to a 5 day history of
worsening dizziness, imbalance and fogginess. She underwent an
MRI/MRA of the head due to concerns for posterior circulation
stroke. The study was negative for stroke, however, MR angiogram
revealed a 6 mm cavernous right ICA aneurysm which was confirmed
during cerebral angiogram performed on [DATE]. She
underwent elective endovascular embolization of this aneurysm on
[DATE] with placement of a flow diverter in the intracranial
right ICA, across the neck of the aneurysm. She is currently on dual
anti-platelet therapy with aspirin and Brilinta. She comes to our
service today for a diagnostic cerebral angiogram to evaluate
treatment outcome.

EXAM:
ULTRASOUND-GUIDED VASCULAR [REDACTED] CEREBRAL ANGIOGRAM
TECHNIQUE: Informed written consent was obtained from the patient after a
thorough discussion of the procedural risks, benefits and
alternatives. All questions were addressed.

[Series 1: ir (id) (id) · 1 of 4 slices shown]
[im 4/4]
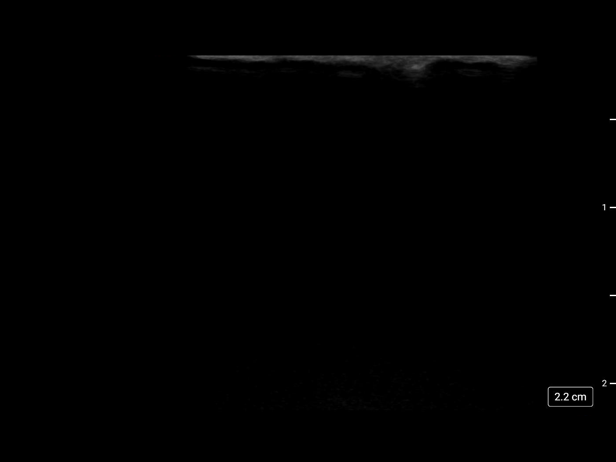

[Series 3: cerebral care 2 · 2 acquisitions, 1 frame shown (1 of 6)]
[im 1/2]
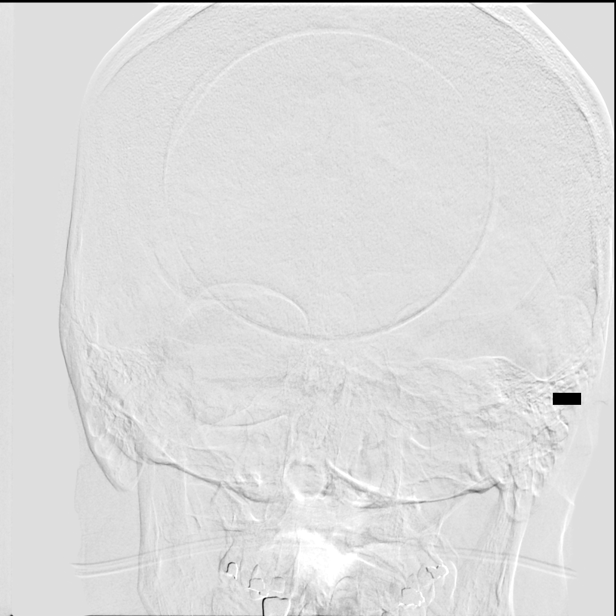

[Series 4: cerebral care 2 · 2 acquisitions, 1 frame shown (2 of 6)]
[im 1/2]
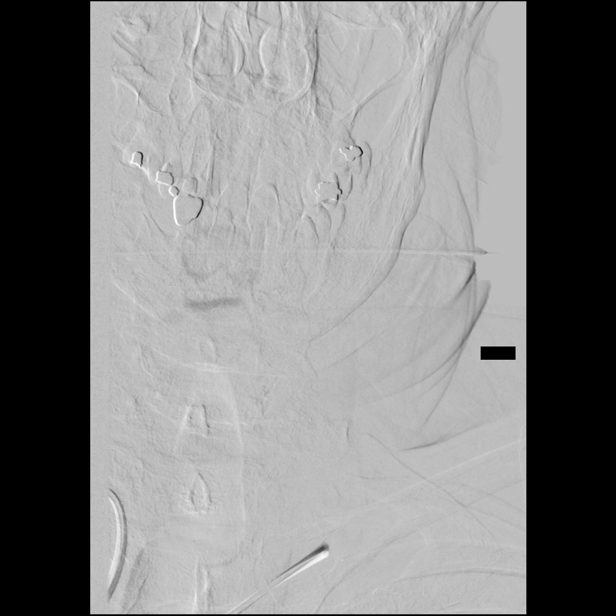

[Series 5: cerebral care 2 · 2 acquisitions, 1 frame shown (3 of 6)]
[im 1/2]
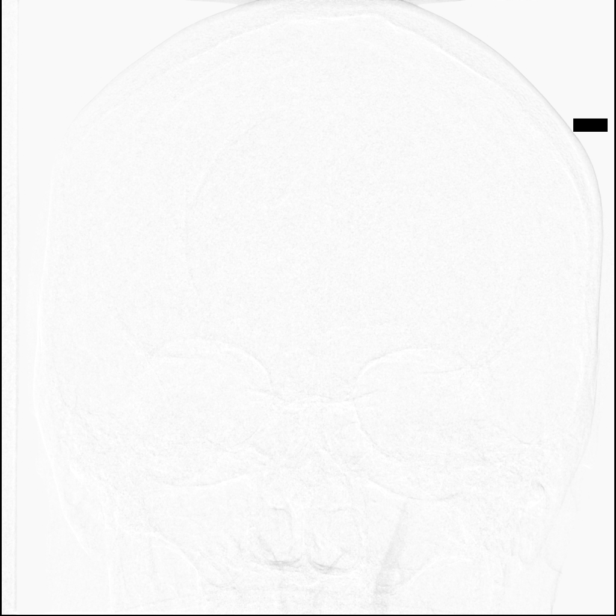

[Series 6: cerebral care 2 · 2 acquisitions, 1 frame shown (4 of 6)]
[im 1/2]
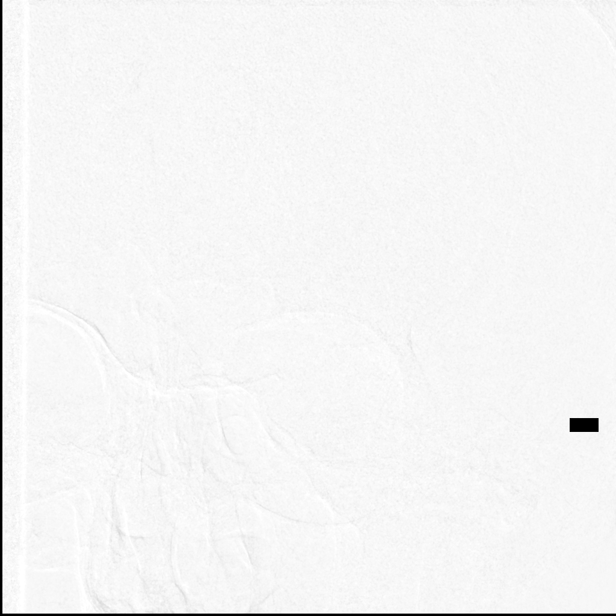

[Series 7: cerebral care 2 · 2 acquisitions, 1 frame shown (5 of 6)]
[im 1/2]
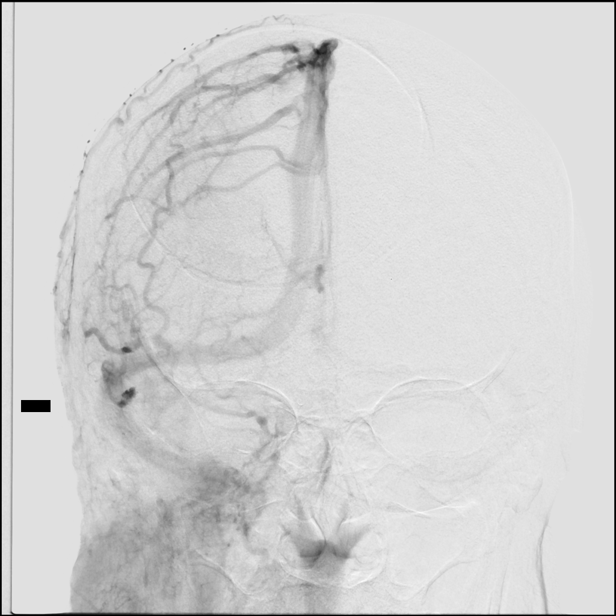

[Series 8: cerebral care 2 · 2 acquisitions, 1 frame shown (6 of 6)]
[im 1/2]
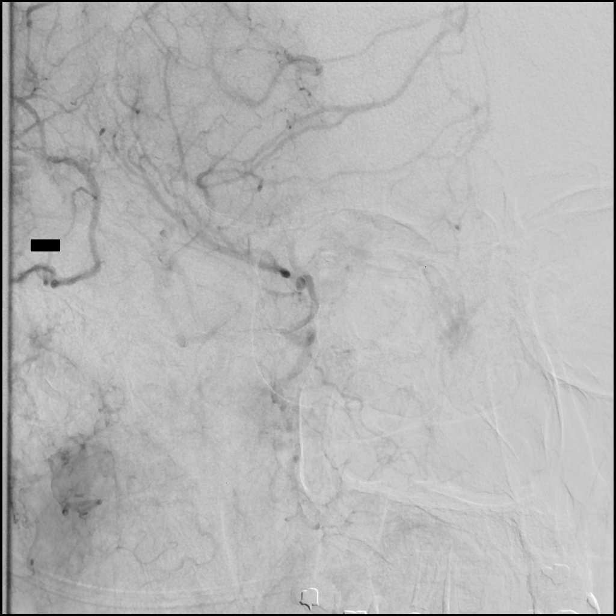

[Series 9: cerebral 2 · 2 acquisitions, 1 frame shown]
[im 1/2]
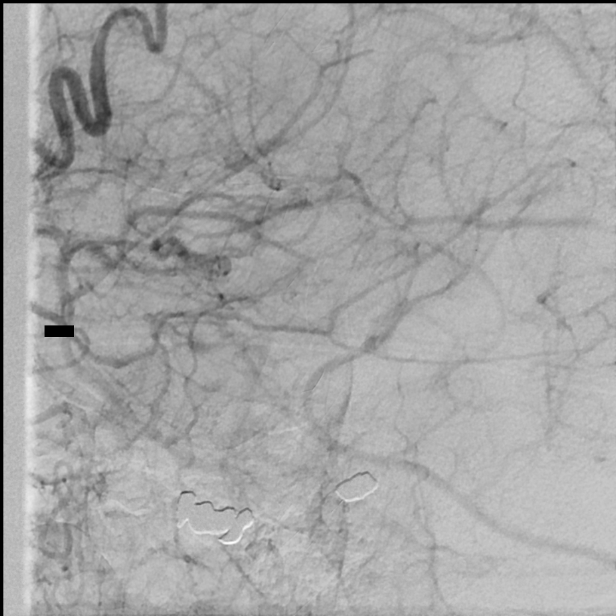

[Series 300: dr. (person_name) · 3 of 18 slices shown]
[im 1/18]
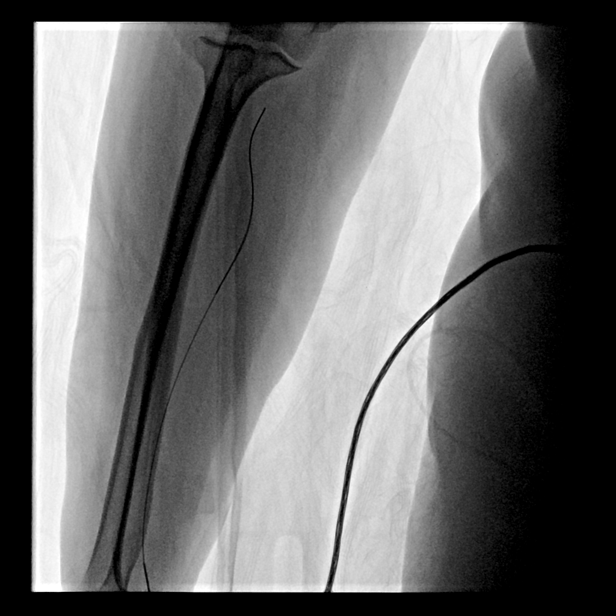
[im 7/18]
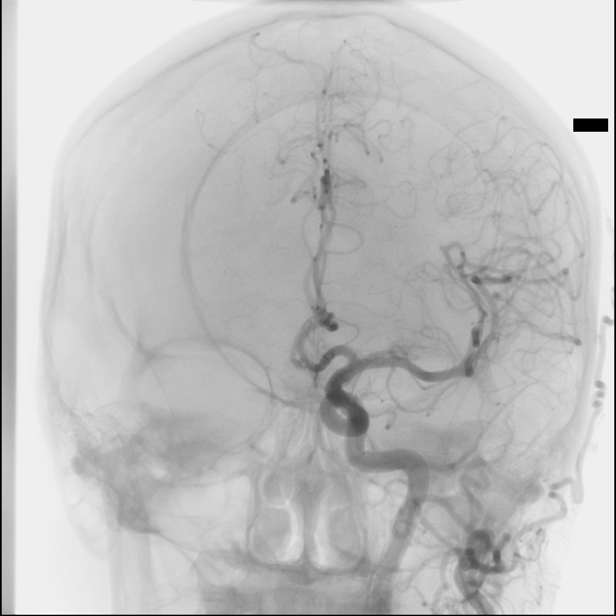
[im 14/18]
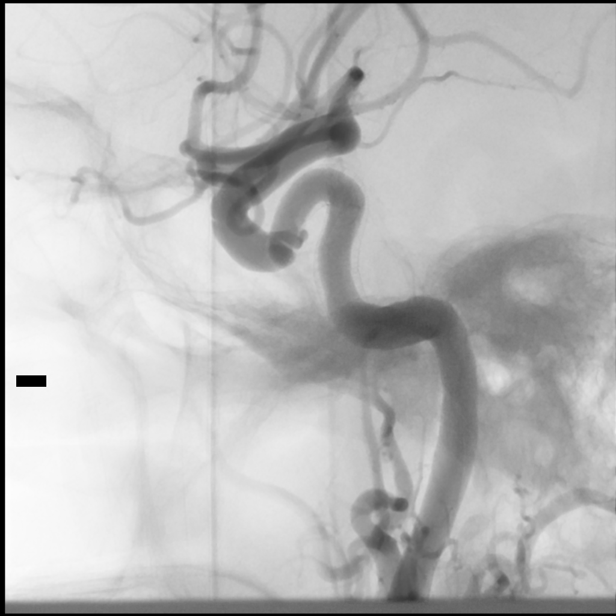

[11 of 24 positions shown; findings below may reference images not displayed]

MEDICATIONS:
5,000 MORENOH heparin, 5 mg Verapamil and 200 mcg nitroglycerin.

ANESTHESIA/SEDATION:
Moderate (conscious) sedation was employed during this procedure. A
total of Versed 1 mg and Fentanyl 25 mcg was administered
intravenously by the radiology nurse.

Total intra-service moderate Sedation Time: 49 minutes. The
patient's level of consciousness and vital signs were monitored
continuously by radiology nursing throughout the procedure under my
direct supervision.

CONTRAST:  80 mL of Omnipaque 300 milligram/mL

FLUOROSCOPY:
Radiation Exposure Index (as provided by the fluoroscopic device):
483 mGy Kerma

COMPLICATIONS:
None immediate.
Maximal Sterile Barrier Technique was utilized including caps, mask,
sterile gowns, sterile gloves, sterile drape, hand hygiene and skin
antiseptic. A timeout was performed prior to the initiation of the
procedure.

Using the modified Seldinger technique and a micropuncture kit,
access was gained to the right radial artery at the wrist and a 5
French sheath was placed. Real-time ultrasound guidance was utilized
for vascular access including the acquisition of a permanent
ultrasound image documenting patency of the accessed vessel. Slow
intra arterial infusion of 5,000 MORENOH heparin, 5 mg Verapamil and 200
mcg nitroglycerin diluted in patient's own blood was performed. No
significant fluctuation in patient's blood pressure seen. Then, a
right radial artery angiogram was obtained via sheath side port.
Normal brachial artery branching pattern seen. No significant
anatomical variation. The right radial artery caliber is adequate
for vascular access.

Next, a 5 MORENOH 2 glide catheter was navigated over a
0.035" Terumo Glidewire into the right subclavian artery under
fluoroscopic guidance. The catheter was then advanced into the
aortic arch where the catheter tip was reformed. Then, the catheter
was placed into the left subclavian artery and then into the left
vertebral artery. Frontal and lateral angiograms of the head were
obtained.

The catheter was then placed into the left common carotid artery.
Frontal and lateral angiograms of the neck were obtained followed by
frontal, lateral, magnified left anterior oblique and magnified
lateral views of the head.

The catheter was subsequently placed into the right common carotid
artery. Frontal and lateral angiograms of the head were obtained
followed by multiple magnified oblique views, centered on the
intracranial right ICA. Then, frontal and lateral angiograms of the
neck were obtained.

The catheter was subsequently withdrawn.

An inflatable band was placed and inflated over the right wrist
access site. The vascular sheath was withdrawn and the band was
slowly deflated until brisk flow was noted through the arteriotomy
site. At this point, the band was reinflated with additional 3 cc of
air to obtain patent hemostasis.
FINDINGS: Right radial artery ultrasound and right radial artery angiogram:
The caliber of the distal right radial artery is appropriate for
angiogram access. The right radial artery and the right ulnar artery
have normal course and caliber. No significant anatomical variants
noted.

Left vertebral artery angiograms: The dominant left vertebral
artery, basilar artery, and bilateral posterior cerebral arteries
are unremarkable. Luminal caliber is smooth and tapering. No
aneurysms or abnormally high-flow, early draining veins are seen. No
regions of abnormal hypervascularity are noted. The visualized dural
sinuses are patent.

Left CCA angiograms: Cervical angiograms show normal course and
caliber of the visualized left common carotid and internal carotid
arteries. There are no significant stenoses.

Left ICA angiograms: There is brisk vascular contrast filling of the
left ACA and MCA vascular trees. Brisk contrast opacification of the
right ACA vascular tree is also seen via a prominent anterior
communicating artery. Luminal caliber is smooth and tapering. No
aneurysms or abnormally high-flow, early draining veins are seen. No
regions of abnormal hypervascularity are noted. The visualized dural
sinuses are patent.

Right CCA angiograms: Cervical angiograms show normal course and
caliber of the visualized right common carotid and internal carotid
arteries. There are no significant stenoses.

Right CCA-cranial views angiograms: Status post placement of a flow
diverter in the intracranial right ICA spanning the distal petrous
and cavernous segment for treatment of a petro-cavernous saccular
aneurysm. There is mild in implant neointimal hyperplasia without
stenosis. No residual aneurysm identified. There is brisk vascular
contrast filling of the right ACA and MCA vascular trees. Luminal
caliber is smooth and tapering. No MORENOH aneurysms or abnormally
high-flow, early draining veins are seen. No regions of abnormal
hypervascularity are noted. The visualized dural sinuses are patent.
The visualized branches of the right external carotid artery are
unremarkable.

PROCEDURE:
No intervention performed.
IMPRESSION: 1. Status post placement of a flow diverter in the intracranial
right ICA for treatment of a petrous cavernous aneurysm. The implant
is widely open without stenosis. No residual aneurysm identified.
2. No MORENOH aneurysm identified.

PLAN:
Patient may discontinue the Brilinta the while remaining on aspirin
81 mg q.d.

## 2021-11-14 MED ORDER — MIDAZOLAM HCL 2 MG/2ML IJ SOLN
INTRAMUSCULAR | Status: DC | PRN
  Administered 2021-11-14: 1 mg via INTRAVENOUS

## 2021-11-14 MED ORDER — ACETAMINOPHEN 325 MG PO TABS
650.0000 mg | ORAL_TABLET | ORAL | Status: AC
  Administered 2021-11-14: 650 mg via ORAL

## 2021-11-14 MED ORDER — ACETAMINOPHEN 325 MG PO TABS
ORAL_TABLET | ORAL | Status: AC
  Filled 2021-11-14: qty 2

## 2021-11-14 MED ORDER — HEPARIN SODIUM (PORCINE) 1000 UNIT/ML IJ SOLN
INTRAMUSCULAR | Status: AC
  Filled 2021-11-14: qty 10

## 2021-11-14 MED ORDER — SODIUM CHLORIDE 0.9 % IV SOLN: INTRAVENOUS | Status: DC

## 2021-11-14 MED ORDER — MIDAZOLAM HCL 2 MG/2ML IJ SOLN
INTRAMUSCULAR | Status: AC
  Filled 2021-11-14: qty 2

## 2021-11-14 MED ORDER — TICAGRELOR 90 MG PO TABS
90.0000 mg | ORAL_TABLET | Freq: Once | ORAL | Status: DC
  Filled 2021-11-14: qty 1

## 2021-11-14 MED ORDER — NALOXONE HCL 0.4 MG/ML IJ SOLN
INTRAMUSCULAR | Status: AC
  Filled 2021-11-14: qty 1

## 2021-11-14 MED ORDER — NITROGLYCERIN 1 MG/10 ML FOR IR/CATH LAB
INTRA_ARTERIAL | Status: AC
  Filled 2021-11-14: qty 10

## 2021-11-14 MED ORDER — ATROPINE SULFATE 1 MG/10ML IJ SOSY
PREFILLED_SYRINGE | INTRAMUSCULAR | Status: AC
  Filled 2021-11-14: qty 10

## 2021-11-14 MED ORDER — LIDOCAINE HCL 1 % IJ SOLN
INTRAMUSCULAR | Status: AC
  Filled 2021-11-14: qty 20

## 2021-11-14 MED ORDER — VERAPAMIL HCL 2.5 MG/ML IV SOLN
INTRAVENOUS | Status: AC
  Filled 2021-11-14: qty 2

## 2021-11-14 MED ORDER — IOHEXOL 300 MG/ML  SOLN
100.0000 mL | Freq: Once | INTRAMUSCULAR | Status: AC | PRN
  Administered 2021-11-14: 40 mL via INTRA_ARTERIAL

## 2021-11-14 MED ORDER — VERAPAMIL HCL 2.5 MG/ML IV SOLN: INTRA_ARTERIAL | Status: DC | PRN

## 2021-11-14 MED ORDER — FENTANYL CITRATE (PF) 100 MCG/2ML IJ SOLN
INTRAMUSCULAR | Status: DC | PRN
  Administered 2021-11-14: 25 ug via INTRAVENOUS

## 2021-11-14 MED ORDER — FENTANYL CITRATE (PF) 100 MCG/2ML IJ SOLN
INTRAMUSCULAR | Status: AC
  Filled 2021-11-14: qty 2

## 2021-11-14 MED ORDER — FLUMAZENIL 0.5 MG/5ML IV SOLN
INTRAVENOUS | Status: AC
  Filled 2021-11-14: qty 5

## 2021-11-14 NOTE — Sedation Documentation (Signed)
Patient is resting comfortably. No complaints at this time. Pt able to follow directions. VSS, procedure continues

## 2021-11-14 NOTE — Sedation Documentation (Signed)
Vital signs stable. 

## 2021-11-14 NOTE — H&P (Signed)
Chief Complaint: Patient was seen in consultation today for Cerebral arteriogram at the request of Dr Liborio Nixon  Referring Physician(s): [place]  Supervising Physician: Pedro Earls  Patient Status: [facility] - Out-pt  History of Present Illness: Kimberly Conway is a 66 y.o. female   Known to NIR Hx R ICA aneurysm intervention with Dr Tennis Must Sindy Messing 04/24/21 Successful placement of a flow diverter in the intracranial right ICA for treatment of a petrocavernous aneurysm. No evidence of hemorrhagic or thromboembolic complication. Still some dizziness Denies headache; visual or speech changes Denies N/V/D; no SOB  Scheduled today for follow up arteriogram in IR  Past Medical History:  Diagnosis Date   Anginal pain ([place])    Depression    Family history of adverse reaction to anesthesia    sister gets nauseous after anesthesia   GAD (generalized anxiety disorder)    GERD (gastroesophageal reflux disease)    Hepatitis B 1965   HTN (hypertension)    Multiple thyroid nodules    Other atopic dermatitis 08/17/2020   Seasonal allergies    Urticaria    Vertigo     Past Surgical History:  Procedure Laterality Date   APPENDECTOMY     COLONOSCOPY  08/28/2019   DILATION AND CURETTAGE OF UTERUS     IR 3D INDEPENDENT WKST  04/24/2021   IR ANGIO EXTERNAL CAROTID SEL EXT CAROTID UNI R MOD SED  04/24/2021   IR ANGIO INTRA EXTRACRAN SEL COM CAROTID INNOMINATE UNI R MOD SED  02/14/2021   IR ANGIO INTRA EXTRACRAN SEL INTERNAL CAROTID UNI L MOD SED  02/14/2021   IR ANGIO INTRA EXTRACRAN SEL INTERNAL CAROTID UNI R MOD SED  04/24/2021   IR ANGIO VERTEBRAL SEL SUBCLAVIAN INNOMINATE UNI R MOD SED  02/14/2021   IR ANGIO VERTEBRAL SEL VERTEBRAL UNI L MOD SED  02/14/2021   IR CT HEAD LTD  04/24/2021   IR PTA INTRACRANIAL  04/24/2021   IR TRANSCATH/EMBOLIZ  04/24/2021   IR US GUIDE VASC ACCESS RIGHT  02/14/2021   IR US GUIDE VASC ACCESS RIGHT  04/24/2021    RADIOLOGY WITH ANESTHESIA N/A 04/24/2021   Procedure: EMBOLIZATION;  Surgeon: Pedro Earls, MD;  Location: [facility];  Service: Radiology;  Laterality: N/A;   UPPER GI ENDOSCOPY  08/28/2019    Allergies: Patient has no known allergies.  Medications: Prior to Admission medications   Medication Sig Start Date End Date Taking? Authorizing Provider  acetaminophen (TYLENOL) 500 MG tablet Take 1,000 mg by mouth every 6 (six) hours as needed (pain).   Yes [provider]  aspirin EC 81 MG tablet Take 81 mg by mouth in the morning. Swallow whole.   Yes [provider]  b complex vitamins tablet Take 1 tablet by mouth in the morning.   Yes [provider]  Calcium Citrate (CITRACAL PO) Take 3 tablets by mouth daily after lunch.   Yes [provider]  cetirizine (ZYRTEC) 10 MG tablet Take 1 tablet (10 mg total) by mouth daily. 07/02/21  Yes Isaac Bliss, Rayford Halsted, MD  desvenlafaxine (PRISTIQ) 50 MG 24 hr tablet TAKE 1 TABLET BY MOUTH EVERY DAY 10/02/21  Yes Mozingo, Berdie Ogren, NP  fluticasone (FLONASE) 50 MCG/ACT nasal spray USE 2 SPRAYS IN EACH NOSTRIL EVERY DAY Patient taking differently: Place 1 spray into both nostrils daily. 10/01/21  Yes Isaac Bliss, Rayford Halsted, MD  levocetirizine (XYZAL) 5 MG tablet TAKE 1 TABLET (5 MG TOTAL) BY MOUTH  EVERY EVENING. 06/11/21  Yes Isaac Bliss, Rayford Halsted, MD  Misc Natural Products (GLUCOSAMINE CHONDROITIN TRIPLE) TABS Take 1 tablet by mouth daily.   Yes [provider]  Multiple Vitamin (MULTIVITAMIN) tablet Take 1 tablet by mouth daily after lunch.   Yes [provider]  omeprazole (PRILOSEC) 20 MG capsule TAKE 1 CAPSULE (20 MG TOTAL) BY MOUTH 2 (TWO) TIMES DAILY BEFORE A MEAL. 07/30/21  Yes Isaac Bliss, Rayford Halsted, MD  Polyethyl Glycol-Propyl Glycol (LUBRICANT EYE DROPS) 0.4-0.3 % SOLN Place 1-2 drops into both eyes 3 (three) times daily as needed (dry/irritated eyes).   Yes  [provider]  Probiotic Product (PROBIOTIC PO) Take 1 capsule by mouth in the morning.   Yes [provider]  sodium chloride (OCEAN) 0.65 % SOLN nasal spray Place 1 spray into both nostrils 2 (two) times daily.   Yes [provider]  ticagrelor (BRILINTA) 90 MG TABS tablet Take 1 tablet (90 mg total) by mouth 2 (two) times daily. 08/12/21  Yes Han, Aimee H, PA-C  Turmeric 500 MG CAPS Take 500 mg by mouth daily after lunch.   Yes [provider]  LORazepam (ATIVAN) 0.5 MG tablet Take 1 tablet (0.5 mg total) by mouth daily as needed. 02/05/20   Mozingo, Berdie Ogren, NP     Family History  Problem Relation Age of Onset   Diabetes Mother    Breast cancer Mother    CAD Father    Hypertension Sister    Breast cancer Sister    Migraines Sister    Migraines Sister    Diabetes Brother    Colon cancer Neg Hx    Esophageal cancer Neg Hx    Pancreatic cancer Neg Hx    Stomach cancer Neg Hx     Social History   Socioeconomic History   Marital status: Single    Spouse name: Not on file   Number of children: Not on file   Years of education: Not on file   Highest education level: Not on file  Occupational History   Not on file  Tobacco Use   Smoking status: Former    Packs/day: 0.50    Types: Cigarettes    Quit date: 06/21/2014    Years since quitting: 7.4   Smokeless tobacco: Never  Vaping Use   Vaping Use: Never used  Substance and Sexual Activity   Alcohol use: Not Currently    Comment: Quit in 2016   Drug use: Never   Sexual activity: Not Currently    Birth control/protection: Post-menopausal  Other Topics Concern   Not on file  Social History Narrative   Right handed   Social Determinants of Health   Financial Resource Strain: Not on file  Food Insecurity: Not on file  Transportation Needs: Not on file  Physical Activity: Not on file  Stress: Not on file  Social Connections: Not on file    Review of Systems: A 12 point ROS  discussed and pertinent positives are indicated in the HPI above.  All other systems are negative.  Review of Systems  Constitutional:  Negative for activity change, fatigue and fever.  HENT:  Negative for tinnitus and trouble swallowing.   Eyes:  Negative for visual disturbance.  Respiratory:  Negative for cough and shortness of breath.   Cardiovascular:  Negative for chest pain.  Gastrointestinal:  Negative for abdominal pain, diarrhea and nausea.  Musculoskeletal:  Negative for gait problem.  Neurological:  Positive for dizziness. Negative for tremors,  seizures, syncope, facial asymmetry, speech difficulty, weakness, light-headedness, numbness and headaches.  Psychiatric/Behavioral:  Negative for behavioral problems and confusion.     Vital Signs: BP 133/89   Pulse 62   Temp 98.6 F (37 C) (Oral)   Resp 16   Ht '5\' 2"'$  (1.575 m)   Wt 150 lb (68 kg)   SpO2 98%   BMI 27.44 kg/m   Physical Exam Vitals reviewed.  HENT:     Mouth/Throat:     Mouth: Mucous membranes are moist.  Eyes:     Extraocular Movements: Extraocular movements intact.  Cardiovascular:     Rate and Rhythm: Normal rate and regular rhythm.     Heart sounds: Normal heart sounds.  Pulmonary:     Effort: Pulmonary effort is normal.     Breath sounds: Normal breath sounds.  Abdominal:     General: Bowel sounds are normal.     Palpations: Abdomen is soft.     Tenderness: There is no abdominal tenderness.  Musculoskeletal:        General: Normal range of motion.     Right lower leg: No edema.     Left lower leg: No edema.  Skin:    General: Skin is warm.  Neurological:     Mental Status: She is alert and oriented to person, place, and time.  Psychiatric:        Behavior: Behavior normal.     Imaging: No results found.  Labs:  CBC: Recent Labs    02/14/21 0740 04/24/21 0621 07/02/21 0816 11/14/21 0653  WBC 5.6 5.2 5.9 5.7  HGB 14.5 14.2 13.7 14.2  HCT 43.8 42.5 40.9 42.0  PLT 297 296 303.0  311    COAGS: Recent Labs    02/14/21 0740 04/24/21 0621 11/14/21 0653  INR 0.9 1.0 0.9    BMP: Recent Labs    01/23/21 1225 02/14/21 0740 04/24/21 0621 07/02/21 0816 11/14/21 0653  NA 138 137 139 139 140  K 4.5 4.2 3.2* 4.1 3.7  CL 106 103 106 103 106  CO2 '24 26 26 29 24  '$ GLUCOSE 98 91 112* 101* 104*  BUN '12 11 9 11 12  '$ CALCIUM 9.4 9.0 9.1 9.7 9.7  CREATININE 0.71 0.89 0.88 0.83 0.89  GFRNONAA >60 >60 >60  --  >60    LIVER FUNCTION TESTS: Recent Labs    01/23/21 1225 07/02/21 0816  BILITOT 1.0 0.7  AST 32 24  ALT 28 38*  ALKPHOS 113 128*  PROT 7.7 7.5  ALBUMIN 4.2 4.4    TUMOR MARKERS: No results for input(s): "AFPTM", "CEA", "CA199", "CHROMGRNA" in the last 8760 hours.  Assessment and Plan:  Known to NIR with Hx R Internal Carotid artery aneurysm embolization 04/2021 For follow up angiogram today Risks and benefits of cerebral angiogram with intervention were discussed with the patient including, but not limited to bleeding, infection, vascular injury, contrast induced renal failure, stroke or even death.  This interventional procedure involves the use of X-rays and because of the nature of the planned procedure, it is possible that we will have prolonged use of X-ray fluoroscopy.  Potential radiation risks to you include (but are not limited to) the following: - A slightly elevated risk for cancer  several years later in life. This risk is typically less than 0.5% percent. This risk is low in comparison to the normal incidence of human cancer, which is 33% for women and 50% for men according to the [place]. -  Radiation induced injury can include skin redness, resembling a rash, tissue breakdown / ulcers and hair loss (which can be temporary or permanent).   The likelihood of either of these occurring depends on the difficulty of the procedure and whether you are sensitive to radiation due to previous procedures, disease, or genetic  conditions.   IF your procedure requires a prolonged use of radiation, you will be notified and given written instructions for further action.  It is your responsibility to monitor the irradiated area for the 2 weeks following the procedure and to notify your physician if you are concerned that you have suffered a radiation induced injury.    All of the patient's questions were answered, patient is agreeable to proceed.  Consent signed and in chart  Thank you for this interesting consult.  I greatly enjoyed meeting Tiffaney A Klausing and look forward to participating in their care.  A copy of this report was sent to the requesting provider on this date.  Electronically Signed: Lavonia Drafts, PA-C 11/14/2021, 8:06 AM   I spent a total of    25 Minutes in face to face in clinical consultation, greater than 50% of which was counseling/coordinating care for  cerebral arteriogram

## 2021-11-14 NOTE — Sedation Documentation (Signed)
Transported pt to [facility] with IR tech. Report give at bedside as well as on phone to [place]. Pt has no complaints at this time. VSS

## 2021-11-14 NOTE — Sedation Documentation (Signed)
Right arm has some redness present after procedure. Marked by MD.

## 2021-11-14 NOTE — Sedation Documentation (Signed)
Patient is resting comfortably. 

## 2021-11-14 NOTE — Sedation Documentation (Signed)
Right radial artery accessed for procedure at this time

## 2021-11-14 NOTE — Sedation Documentation (Signed)
Patient is resting comfortably. Procedure started °

## 2021-11-14 NOTE — Progress Notes (Signed)
TR band off and right radial site bruised and redness lessened right forearm; H[place], PA notified of bruising and cont redness although less and someone will come and check her arm

## 2021-11-14 NOTE — Progress Notes (Signed)
Client's right arm without redness now; right radial site bruised; [place] PA notified that redness is gone

## 2021-11-14 NOTE — Procedures (Signed)
INTERVENTIONAL NEURORADIOLOGY BRIEF POSTPROCEDURE NOTE  DIAGNOSTIC CEREBRAL ANGIOGRAM  Attending: Dr. Erven Colla de Sindy Messing  Assistant: None.  Diagnosis: Right ICA aneurysm status post endovascular embolization.  Access site: Right radial artery.  Access closure: TR band.  Anesthesia: Moderate sedation.  Medication used: 1 mg Versed IV; 25 mcg Fentanyl IV.  Complications: None.  Estimated blood loss: Negligible.  Specimen: None.  Findings: Status post placement of a pipeline embolization divide in the petrocavernous right ICA for treatment of an aneurysm. The divide is widely patent. No residual aneurysm identified.  The patient tolerated the procedure well without incident or complication and is in stable condition.   PLAN: Patient may discontinue brilinta while remaining on ASA 81 mg q.d.

## 2021-11-14 NOTE — Sedation Documentation (Signed)
Vital signs stable. Procedure continues °

## 2021-11-30 ENCOUNTER — Encounter: Payer: Self-pay | Admitting: Internal Medicine

## 2021-12-01 ENCOUNTER — Telehealth: Payer: Self-pay | Admitting: Internal Medicine

## 2021-12-01 NOTE — Telephone Encounter (Signed)
Message has been sent to Dr. Caryl Never for review

## 2021-12-03 ENCOUNTER — Encounter: Payer: Self-pay | Admitting: Family

## 2021-12-03 ENCOUNTER — Ambulatory Visit (INDEPENDENT_AMBULATORY_CARE_PROVIDER_SITE_OTHER): Payer: Medicare HMO | Admitting: Family

## 2021-12-03 ENCOUNTER — Ambulatory Visit (INDEPENDENT_AMBULATORY_CARE_PROVIDER_SITE_OTHER): Payer: Medicare HMO

## 2021-12-03 DIAGNOSIS — M2578 Osteophyte, vertebrae: Secondary | ICD-10-CM | POA: Diagnosis not present

## 2021-12-03 DIAGNOSIS — M542 Cervicalgia: Secondary | ICD-10-CM | POA: Diagnosis not present

## 2021-12-03 DIAGNOSIS — M47812 Spondylosis without myelopathy or radiculopathy, cervical region: Secondary | ICD-10-CM | POA: Diagnosis not present

## 2021-12-03 DIAGNOSIS — H9201 Otalgia, right ear: Secondary | ICD-10-CM | POA: Diagnosis not present

## 2021-12-03 MED ORDER — METHYLPREDNISOLONE 4 MG PO TBPK: ORAL_TABLET | ORAL | 0 refills | Status: DC

## 2021-12-04 NOTE — Progress Notes (Signed)
Acute Office Visit  Subjective:     Patient ID: Kimberly Conway, female    DOB: Oct 21, 1955, 66 y.o.   MRN: [id number]  Chief Complaint  Patient presents with  . Ear Pain    Patient complains of right ear pain x1 day, initially noted pain in her neck when rotating her head  . Rash    Patient complains of a "raised bump" noted along right wrist noted 1 week ago, states she had a catheter placed this area after angioplasy June 9th    HPI Patient is in today with c/o pain to the right ear and right side of the neck x 1 day. The pain is worse when rotating her head and touching an area behind her neck. She describes the pain as sharp, 10/10. Denies any history of neck or ear pain. Has not tried any medication for relief.  Also reports having an angioplasty performed 4 weeks ago and has a healing scar on her right wrist. She would just like to have it checked because she noticed the bump had turned a little purple in color.   Review of Systems  Constitutional: Negative.   Respiratory: Negative.    Cardiovascular: Negative.   Musculoskeletal:  Positive for neck pain.  Skin: Negative.   Neurological: Negative.   Psychiatric/Behavioral: Negative.    All other systems reviewed and are negative.  Past Medical History:  Diagnosis Date  . Anginal pain ([place])   . Depression   . Family history of adverse reaction to anesthesia    sister gets nauseous after anesthesia  . GAD (generalized anxiety disorder)   . GERD (gastroesophageal reflux disease)   . Hepatitis B 1965  . HTN (hypertension)   . Multiple thyroid nodules   . Other atopic dermatitis 08/17/2020  . Seasonal allergies   . Urticaria   . Vertigo     Social History   Socioeconomic History  . Marital status: Single    Spouse name: Not on file  . Number of children: Not on file  . Years of education: Not on file  . Highest education level: Not on file  Occupational History  . Not on file  Tobacco Use  . Smoking status:  Former    Packs/day: 0.50    Types: Cigarettes    Quit date: 06/21/2014    Years since quitting: 7.4  . Smokeless tobacco: Never  Vaping Use  . Vaping Use: Never used  Substance and Sexual Activity  . Alcohol use: Not Currently    Comment: Quit in 2016  . Drug use: Never  . Sexual activity: Not Currently    Birth control/protection: Post-menopausal  Other Topics Concern  . Not on file  Social History Narrative   Right handed   Social Determinants of Health   Financial Resource Strain: Not on file  Food Insecurity: Not on file  Transportation Needs: Not on file  Physical Activity: Not on file  Stress: Not on file  Social Connections: Not on file  Intimate Partner Violence: Not on file    Past Surgical History:  Procedure Laterality Date  . APPENDECTOMY    . COLONOSCOPY  08/28/2019  . DILATION AND CURETTAGE OF UTERUS    . IR 3D INDEPENDENT WKST  04/24/2021  . IR ANGIO EXTERNAL CAROTID SEL EXT CAROTID UNI R MOD SED  04/24/2021  . IR ANGIO INTRA EXTRACRAN SEL COM CAROTID INNOMINATE BILAT MOD SED  11/14/2021  . IR ANGIO INTRA EXTRACRAN SEL COM CAROTID INNOMINATE UNI  R MOD SED  02/14/2021  . IR ANGIO INTRA EXTRACRAN SEL INTERNAL CAROTID UNI L MOD SED  02/14/2021  . IR ANGIO INTRA EXTRACRAN SEL INTERNAL CAROTID UNI R MOD SED  04/24/2021  . IR ANGIO VERTEBRAL SEL SUBCLAVIAN INNOMINATE UNI R MOD SED  02/14/2021  . IR ANGIO VERTEBRAL SEL VERTEBRAL UNI L MOD SED  02/14/2021  . IR ANGIO VERTEBRAL SEL VERTEBRAL UNI L MOD SED  11/14/2021  . IR CT HEAD LTD  04/24/2021  . IR PTA INTRACRANIAL  04/24/2021  . IR TRANSCATH/EMBOLIZ  04/24/2021  . IR US GUIDE VASC ACCESS RIGHT  02/14/2021  . IR US GUIDE VASC ACCESS RIGHT  04/24/2021  . IR US GUIDE VASC ACCESS RIGHT  11/14/2021  . RADIOLOGY WITH ANESTHESIA N/A 04/24/2021   Procedure: EMBOLIZATION;  Surgeon: Pedro Earls, MD;  Location: [facility];  Service: Radiology;  Laterality: N/A;  . UPPER GI ENDOSCOPY  08/28/2019    Family  History  Problem Relation Age of Onset  . Diabetes Mother   . Breast cancer Mother   . CAD Father   . Hypertension Sister   . Breast cancer Sister   . Migraines Sister   . Migraines Sister   . Diabetes Brother   . Colon cancer Neg Hx   . Esophageal cancer Neg Hx   . Pancreatic cancer Neg Hx   . Stomach cancer Neg Hx     No Known Allergies  Current Outpatient Medications on File Prior to Visit  Medication Sig Dispense Refill  . aspirin EC 81 MG tablet Take 81 mg by mouth in the morning. Swallow whole.    . b complex vitamins tablet Take 1 tablet by mouth in the morning.    . Calcium Citrate (CITRACAL PO) Take 3 tablets by mouth daily after lunch.    . cetirizine (ZYRTEC) 10 MG tablet Take 1 tablet (10 mg total) by mouth daily. 30 tablet 11  . desvenlafaxine (PRISTIQ) 50 MG 24 hr tablet TAKE 1 TABLET BY MOUTH EVERY DAY 90 tablet 0  . fluticasone (FLONASE) 50 MCG/ACT nasal spray USE 2 SPRAYS IN EACH NOSTRIL EVERY DAY (Patient taking differently: Place 1 spray into both nostrils daily.) 48 g 2  . levocetirizine (XYZAL) 5 MG tablet TAKE 1 TABLET (5 MG TOTAL) BY MOUTH EVERY EVENING. 90 tablet 1  . LORazepam (ATIVAN) 0.5 MG tablet Take 1 tablet (0.5 mg total) by mouth daily as needed. 30 tablet 2  . Misc Natural Products (GLUCOSAMINE CHONDROITIN TRIPLE) TABS Take 1 tablet by mouth daily.    . Multiple Vitamin (MULTIVITAMIN) tablet Take 1 tablet by mouth daily after lunch.    [name] Kitchen omeprazole (PRILOSEC) 20 MG capsule TAKE 1 CAPSULE (20 MG TOTAL) BY MOUTH 2 (TWO) TIMES DAILY BEFORE A MEAL. 180 capsule 1  . Polyethyl Glycol-Propyl Glycol (LUBRICANT EYE DROPS) 0.4-0.3 % SOLN Place 1-2 drops into both eyes 3 (three) times daily as needed (dry/irritated eyes).    . Probiotic Product (PROBIOTIC PO) Take 1 capsule by mouth in the morning.    . sodium chloride (OCEAN) 0.65 % SOLN nasal spray Place 1 spray into both nostrils 2 (two) times daily.    . Turmeric 500 MG CAPS Take 500 mg by mouth daily after  lunch.     No current facility-administered medications on file prior to visit.    There were no vitals taken for this visit.chart      Objective:    There were no vitals taken for this  visit.   Physical Exam Vitals and nursing note reviewed.  Constitutional:      Appearance: Normal appearance.  Neck:     Comments: Pain with rotation of the neck. Tenderness to palpation of the right cervical spine.  Cardiovascular:     Rate and Rhythm: Normal rate and regular rhythm.  Pulmonary:     Effort: Pulmonary effort is normal.     Breath sounds: Normal breath sounds.  Musculoskeletal:        General: Normal range of motion.     Cervical back: Tenderness present.  Skin:    General: Skin is warm and dry.  Neurological:     General: No focal deficit present.     Mental Status: She is oriented to person, place, and time.  Psychiatric:        Mood and Affect: Mood normal.        Behavior: Behavior normal.   No results found for any visits on 12/03/21.      Assessment & Plan:   Problem List Items Addressed This Visit   None Visit Diagnoses     Neck pain    -  Primary   Relevant Orders   DG Cervical Spine Complete (Completed)   Right ear pain           Meds ordered this encounter  Medications  . methylPREDNISolone (MEDROL DOSEPAK) 4 MG TBPK tablet    Sig: As directed    Dispense:  21 tablet    Refill:  0   Neck pain likely arthritis. Xray to confirm.  Call the office if symptoms worsen or persist. Recheck as scheduled and sooner as needed.  No follow-ups on file.  Kennyth Arnold, FNP

## 2021-12-05 ENCOUNTER — Encounter: Payer: Self-pay | Admitting: Internal Medicine

## 2021-12-05 DIAGNOSIS — M542 Cervicalgia: Secondary | ICD-10-CM

## 2021-12-26 ENCOUNTER — Ambulatory Visit (INDEPENDENT_AMBULATORY_CARE_PROVIDER_SITE_OTHER): Payer: Medicare HMO | Admitting: Orthopaedic Surgery

## 2021-12-26 DIAGNOSIS — M542 Cervicalgia: Secondary | ICD-10-CM

## 2021-12-26 NOTE — Progress Notes (Signed)
Chief Complaint: Neck ear pain     History of Present Illness:    Kimberly Conway is a 66 y.o. female presents today for follow-up as she was experiencing some tenderness around the ear and neck while she was cleaning the shower.  She subsequently went to her primary care provider and they found significant bone spurring on cervical x-rays.  She is here today for further assessment.  She denies any numbness paresthesias or upper extremity symptoms.    Surgical History:   None  PMH/PSH/Family History/Social History/Meds/Allergies:    Past Medical History:  Diagnosis Date   Anginal pain ([place])    Depression    Family history of adverse reaction to anesthesia    sister gets nauseous after anesthesia   GAD (generalized anxiety disorder)    GERD (gastroesophageal reflux disease)    Hepatitis B 1965   HTN (hypertension)    Multiple thyroid nodules    Other atopic dermatitis 08/17/2020   Seasonal allergies    Urticaria    Vertigo    Past Surgical History:  Procedure Laterality Date   APPENDECTOMY     COLONOSCOPY  08/28/2019   DILATION AND CURETTAGE OF UTERUS     IR 3D INDEPENDENT WKST  04/24/2021   IR ANGIO EXTERNAL CAROTID SEL EXT CAROTID UNI R MOD SED  04/24/2021   IR ANGIO INTRA EXTRACRAN SEL COM CAROTID INNOMINATE BILAT MOD SED  11/14/2021   IR ANGIO INTRA EXTRACRAN SEL COM CAROTID INNOMINATE UNI R MOD SED  02/14/2021   IR ANGIO INTRA EXTRACRAN SEL INTERNAL CAROTID UNI L MOD SED  02/14/2021   IR ANGIO INTRA EXTRACRAN SEL INTERNAL CAROTID UNI R MOD SED  04/24/2021   IR ANGIO VERTEBRAL SEL SUBCLAVIAN INNOMINATE UNI R MOD SED  02/14/2021   IR ANGIO VERTEBRAL SEL VERTEBRAL UNI L MOD SED  02/14/2021   IR ANGIO VERTEBRAL SEL VERTEBRAL UNI L MOD SED  11/14/2021   IR CT HEAD LTD  04/24/2021   IR PTA INTRACRANIAL  04/24/2021   IR TRANSCATH/EMBOLIZ  04/24/2021   IR US GUIDE VASC ACCESS RIGHT  02/14/2021   IR US GUIDE VASC ACCESS RIGHT  04/24/2021   IR  US GUIDE VASC ACCESS RIGHT  11/14/2021   RADIOLOGY WITH ANESTHESIA N/A 04/24/2021   Procedure: EMBOLIZATION;  Surgeon: Pedro Earls, MD;  Location: [place];  Service: Radiology;  Laterality: N/A;   UPPER GI ENDOSCOPY  08/28/2019   Social History   Socioeconomic History   Marital status: Single    Spouse name: Not on file   Number of children: Not on file   Years of education: Not on file   Highest education level: Not on file  Occupational History   Not on file  Tobacco Use   Smoking status: Former    Packs/day: 0.50    Types: Cigarettes    Quit date: 06/21/2014    Years since quitting: 7.5   Smokeless tobacco: Never  Vaping Use   Vaping Use: Never used  Substance and Sexual Activity   Alcohol use: Not Currently    Comment: Quit in 2016   Drug use: Never   Sexual activity: Not Currently    Birth control/protection: Post-menopausal  Other Topics Concern   Not on file  Social History Narrative   Right handed  Social Determinants of Health   Financial Resource Strain: Not on file  Food Insecurity: Not on file  Transportation Needs: Not on file  Physical Activity: Not on file  Stress: Not on file  Social Connections: Not on file   Family History  Problem Relation Age of Onset   Diabetes Mother    Breast cancer Mother    CAD Father    Hypertension Sister    Breast cancer Sister    Migraines Sister    Migraines Sister    Diabetes Brother    Colon cancer Neg Hx    Esophageal cancer Neg Hx    Pancreatic cancer Neg Hx    Stomach cancer Neg Hx    No Known Allergies Current Outpatient Medications  Medication Sig Dispense Refill   aspirin EC 81 MG tablet Take 81 mg by mouth in the morning. Swallow whole.     b complex vitamins tablet Take 1 tablet by mouth in the morning.     Calcium Citrate (CITRACAL PO) Take 3 tablets by mouth daily after lunch.     cetirizine (ZYRTEC) 10 MG tablet Take 1 tablet (10 mg total) by mouth daily. 30 tablet 11    desvenlafaxine (PRISTIQ) 50 MG 24 hr tablet TAKE 1 TABLET BY MOUTH EVERY DAY 90 tablet 0   fluticasone (FLONASE) 50 MCG/ACT nasal spray USE 2 SPRAYS IN EACH NOSTRIL EVERY DAY (Patient taking differently: Place 1 spray into both nostrils daily.) 48 g 2   levocetirizine (XYZAL) 5 MG tablet TAKE 1 TABLET (5 MG TOTAL) BY MOUTH EVERY EVENING. 90 tablet 1   LORazepam (ATIVAN) 0.5 MG tablet Take 1 tablet (0.5 mg total) by mouth daily as needed. 30 tablet 2   methylPREDNISolone (MEDROL DOSEPAK) 4 MG TBPK tablet As directed 21 tablet 0   Misc Natural Products (GLUCOSAMINE CHONDROITIN TRIPLE) TABS Take 1 tablet by mouth daily.     Multiple Vitamin (MULTIVITAMIN) tablet Take 1 tablet by mouth daily after lunch.     omeprazole (PRILOSEC) 20 MG capsule TAKE 1 CAPSULE (20 MG TOTAL) BY MOUTH 2 (TWO) TIMES DAILY BEFORE A MEAL. 180 capsule 1   Polyethyl Glycol-Propyl Glycol (LUBRICANT EYE DROPS) 0.4-0.3 % SOLN Place 1-2 drops into both eyes 3 (three) times daily as needed (dry/irritated eyes).     Probiotic Product (PROBIOTIC PO) Take 1 capsule by mouth in the morning.     sodium chloride (OCEAN) 0.65 % SOLN nasal spray Place 1 spray into both nostrils 2 (two) times daily.     Turmeric 500 MG CAPS Take 500 mg by mouth daily after lunch.     No current facility-administered medications for this visit.   No results found.  Review of Systems:   A ROS was performed including pertinent positives and negatives as documented in the HPI.  Physical Exam :   Constitutional: NAD and appears stated age Neurological: Alert and oriented Psych: Appropriate affect and cooperative There were no vitals taken for this visit.   Comprehensive Musculoskeletal Exam:    Tenderness palpation about midline cervical spine.  Limited rotation with 20 degrees in both directions.  Negative Spurling maneuver.  Negative Hoffmann  Imaging:   Xray (cervical spine 4 views): Multilevel degenerative disc disease  I personally reviewed  and interpreted the radiographs.   Assessment:   66 y.o. female with degenerative disc disease involving the cervical spine.  At this time is not appear to be any type of radicular pathic and/or cervical spinal stenosis symptoms.  To that  effect she is tolerating this quite well.  I will plan to see her back on an as-needed basis.  I counseled her on specific neck traction device should her ear and back pain become worse  Plan :    -Return to clinic as needed     I personally saw and evaluated the patient, and participated in the management and treatment plan.  Vanetta Mulders, MD Attending Physician, Orthopedic Surgery  This document was dictated using Dragon voice recognition software. A reasonable attempt at proof reading has been made to minimize errors.

## 2021-12-29 ENCOUNTER — Other Ambulatory Visit: Payer: Self-pay

## 2021-12-29 ENCOUNTER — Telehealth: Payer: Self-pay | Admitting: Adult Health

## 2021-12-29 DIAGNOSIS — F331 Major depressive disorder, recurrent, moderate: Secondary | ICD-10-CM

## 2021-12-29 DIAGNOSIS — F411 Generalized anxiety disorder: Secondary | ICD-10-CM

## 2021-12-29 MED ORDER — DESVENLAFAXINE SUCCINATE ER 50 MG PO TB24: 50.0000 mg | ORAL_TABLET | Freq: Every day | ORAL | 0 refills | Status: DC

## 2021-12-29 MED ORDER — LORAZEPAM 0.5 MG PO TABS
0.5000 mg | ORAL_TABLET | Freq: Every day | ORAL | 2 refills | Status: DC | PRN
Start: 2021-12-29 — End: 2022-05-28

## 2021-12-29 NOTE — Telephone Encounter (Signed)
Pended.

## 2021-12-29 NOTE — Telephone Encounter (Signed)
Ok to send in.  

## 2021-12-29 NOTE — Telephone Encounter (Signed)
Please advise if pt needs an appt for meds

## 2021-12-29 NOTE — Telephone Encounter (Signed)
Pt called at 4:55 pm and said that she needs refills on her prstiq 50 mg and her ativan. She has changed pharmacies. The new pharmacy is [facility] [address] [facility] [employer]. She has not been seen since 2022. She said gina told her she didn't have to be seen to get refills.

## 2022-03-07 ENCOUNTER — Other Ambulatory Visit: Payer: Self-pay | Admitting: Internal Medicine

## 2022-03-21 ENCOUNTER — Encounter: Payer: Self-pay | Admitting: Internal Medicine

## 2022-03-30 ENCOUNTER — Telehealth: Payer: Self-pay

## 2022-03-30 NOTE — Patient Outreach (Signed)
  Care Coordination   03/30/2022 Name: Kimberly Conway MRN: [id number] DOB: Nov 02, 1955   Care Coordination Outreach Attempts:  An unsuccessful telephone outreach was attempted today to offer the patient information about available care coordination services as a benefit of their health plan.   Follow Up Plan:  Additional outreach attempts will be made to offer the patient care coordination information and services.   Encounter Outcome:  No Answer  Care Coordination Interventions Activated:  No   Care Coordination Interventions:  No, not indicated    Peter Garter RN, BSN,CCM, [facility] [phone]

## 2022-04-01 DIAGNOSIS — H524 Presbyopia: Secondary | ICD-10-CM | POA: Diagnosis not present

## 2022-04-03 ENCOUNTER — Other Ambulatory Visit: Payer: Self-pay

## 2022-04-03 ENCOUNTER — Telehealth: Payer: Self-pay | Admitting: Adult Health

## 2022-04-03 DIAGNOSIS — F411 Generalized anxiety disorder: Secondary | ICD-10-CM

## 2022-04-03 DIAGNOSIS — F331 Major depressive disorder, recurrent, moderate: Secondary | ICD-10-CM

## 2022-04-03 MED ORDER — DESVENLAFAXINE SUCCINATE ER 50 MG PO TB24: 50.0000 mg | ORAL_TABLET | Freq: Every day | ORAL | 0 refills | Status: DC

## 2022-04-03 NOTE — Telephone Encounter (Signed)
Rx sent 

## 2022-04-03 NOTE — Telephone Encounter (Signed)
Pt called requesting a refill on her prestiq 50 mg. Three month supply. She said that she doesn't need to have an appointment unless she is not doing well. She is doing well. She has not beem seen since 2022

## 2022-04-07 ENCOUNTER — Encounter: Payer: Self-pay | Admitting: Internal Medicine

## 2022-04-08 ENCOUNTER — Encounter: Payer: Self-pay | Admitting: Family Medicine

## 2022-04-08 ENCOUNTER — Ambulatory Visit (INDEPENDENT_AMBULATORY_CARE_PROVIDER_SITE_OTHER): Payer: Medicare HMO | Admitting: Family Medicine

## 2022-04-08 VITALS — BP 120/82 | HR 63 | Temp 98.5°F | Ht 65.0 in | Wt 161.2 lb

## 2022-04-08 DIAGNOSIS — J069 Acute upper respiratory infection, unspecified: Secondary | ICD-10-CM

## 2022-04-08 NOTE — Patient Instructions (Signed)
Tylenol if needed.  High fevers, getting a lot worse, then let us know.

## 2022-04-08 NOTE — Progress Notes (Signed)
Subjective:     Patient ID: Kimberly Conway, female    DOB: January 22, 1956, 66 y.o.   MRN: [id number]  Chief Complaint  Patient presents with   Head Congestion    Head congestion x 3 days Neg Covid test 2 days ago and yesterday    HPI Congested for 3 days, neg covid 2 days ago and yesterday. Not feeling great.  Tylenol helps some. Yesterday, chills, tactile temps. No cough.  Ears feel full. Foggy.  Rsv and covid booster and flu about 3 wks ago.   Health Maintenance Due  Topic Date Due   Medicare Annual Wellness (AWV)  Never done   Hepatitis C Screening  Never done   DEXA SCAN  03/14/2019    Past Medical History:  Diagnosis Date   Anginal pain ([place])    Depression    Family history of adverse reaction to anesthesia    sister gets nauseous after anesthesia   GAD (generalized anxiety disorder)    GERD (gastroesophageal reflux disease)    Hepatitis B 1965   HTN (hypertension)    Multiple thyroid nodules    Other atopic dermatitis 08/17/2020   Seasonal allergies    Urticaria    Vertigo     Past Surgical History:  Procedure Laterality Date   APPENDECTOMY     COLONOSCOPY  08/28/2019   DILATION AND CURETTAGE OF UTERUS     IR 3D INDEPENDENT WKST  04/24/2021   IR ANGIO EXTERNAL CAROTID SEL EXT CAROTID UNI R MOD SED  04/24/2021   IR ANGIO INTRA EXTRACRAN SEL COM CAROTID INNOMINATE BILAT MOD SED  11/14/2021   IR ANGIO INTRA EXTRACRAN SEL COM CAROTID INNOMINATE UNI R MOD SED  02/14/2021   IR ANGIO INTRA EXTRACRAN SEL INTERNAL CAROTID UNI L MOD SED  02/14/2021   IR ANGIO INTRA EXTRACRAN SEL INTERNAL CAROTID UNI R MOD SED  04/24/2021   IR ANGIO VERTEBRAL SEL SUBCLAVIAN INNOMINATE UNI R MOD SED  02/14/2021   IR ANGIO VERTEBRAL SEL VERTEBRAL UNI L MOD SED  02/14/2021   IR ANGIO VERTEBRAL SEL VERTEBRAL UNI L MOD SED  11/14/2021   IR CT HEAD LTD  04/24/2021   IR PTA INTRACRANIAL  04/24/2021   IR TRANSCATH/EMBOLIZ  04/24/2021   IR US GUIDE VASC ACCESS RIGHT  02/14/2021   IR US GUIDE VASC  ACCESS RIGHT  04/24/2021   IR US GUIDE VASC ACCESS RIGHT  11/14/2021   RADIOLOGY WITH ANESTHESIA N/A 04/24/2021   Procedure: EMBOLIZATION;  Surgeon: Pedro Earls, MD;  Location: [facility];  Service: Radiology;  Laterality: N/A;   UPPER GI ENDOSCOPY  08/28/2019    Outpatient Medications Prior to Visit  Medication Sig Dispense Refill   aspirin EC 81 MG tablet Take 81 mg by mouth in the morning. Swallow whole.     b complex vitamins tablet Take 1 tablet by mouth in the morning.     Calcium Citrate (CITRACAL PO) Take 3 tablets by mouth daily after lunch.     cetirizine (ZYRTEC) 10 MG tablet Take 1 tablet (10 mg total) by mouth daily. 30 tablet 11   desvenlafaxine (PRISTIQ) 50 MG 24 hr tablet Take 1 tablet (50 mg total) by mouth daily. 90 tablet 0   fluticasone (FLONASE) 50 MCG/ACT nasal spray USE 2 SPRAYS IN EACH NOSTRIL EVERY DAY (Patient taking differently: Place 1 spray into both nostrils daily.) 48 g 2   levocetirizine (XYZAL) 5 MG tablet TAKE 1 TABLET (5 MG TOTAL) BY MOUTH EVERY  EVENING. 90 tablet 1   LORazepam (ATIVAN) 0.5 MG tablet Take 1 tablet (0.5 mg total) by mouth daily as needed. 30 tablet 2   methylPREDNISolone (MEDROL DOSEPAK) 4 MG TBPK tablet As directed 21 tablet 0   Misc Natural Products (GLUCOSAMINE CHONDROITIN TRIPLE) TABS Take 1 tablet by mouth daily.     Multiple Vitamin (MULTIVITAMIN) tablet Take 1 tablet by mouth daily after lunch.     omeprazole (PRILOSEC) 20 MG capsule TAKE 1 CAPSULE TWICE DAILY BEFORE A MEAL 180 capsule 0   Polyethyl Glycol-Propyl Glycol (LUBRICANT EYE DROPS) 0.4-0.3 % SOLN Place 1-2 drops into both eyes 3 (three) times daily as needed (dry/irritated eyes).     Probiotic Product (PROBIOTIC PO) Take 1 capsule by mouth in the morning.     sodium chloride (OCEAN) 0.65 % SOLN nasal spray Place 1 spray into both nostrils 2 (two) times daily.     Turmeric 500 MG CAPS Take 500 mg by mouth daily after lunch.     No facility-administered medications  prior to visit.    No Known Allergies ROS neg/noncontributory except as noted HPI/below      Objective:     BP 120/82   Pulse 63   Temp 98.5 F (36.9 C) (Temporal)   Ht '5\' 5"'$  (1.651 m)   Wt 161 lb 4 oz (73.1 kg)   SpO2 97%   BMI 26.83 kg/m  Wt Readings from Last 3 Encounters:  04/08/22 161 lb 4 oz (73.1 kg)  11/14/21 150 lb (68 kg)  07/02/21 161 lb 8 oz (73.3 kg)    Physical Exam   Gen: WDWN NAD HEENT: NCAT, conjunctiva not injected, sclera nonicteric TM WNL B, OP moist, no exudates  NECK:  supple, no thyromegaly, no nodes, no carotid bruits CARDIAC: RRR, S1S2+, no murmur.  LUNGS: CTAB. No wheezes EXT:  no edema MSK: no gross abnormalities.  NEURO: A&O x3.  CN II-XII intact.  PSYCH: normal mood. Good eye contact     Assessment & Plan:   Problem List Items Addressed This Visit   None Visit Diagnoses     Viral upper respiratory tract infection    -  Primary      URI-symptomatic tx.  Discussed course of URI and warning signs.  No orders of the defined types were placed in this encounter.   Wellington Hampshire, MD

## 2022-05-14 ENCOUNTER — Other Ambulatory Visit: Payer: Self-pay

## 2022-05-14 ENCOUNTER — Telehealth: Payer: Self-pay | Admitting: Adult Health

## 2022-05-14 DIAGNOSIS — F411 Generalized anxiety disorder: Secondary | ICD-10-CM

## 2022-05-14 NOTE — Telephone Encounter (Signed)
Please schedule appt

## 2022-05-14 NOTE — Telephone Encounter (Signed)
Pharmacy called and said they need a new script of lorazapam sent to them. [place]

## 2022-05-15 NOTE — Telephone Encounter (Signed)
Pt said that she will call back to schedule

## 2022-05-28 ENCOUNTER — Encounter: Payer: Self-pay | Admitting: Adult Health

## 2022-05-28 ENCOUNTER — Ambulatory Visit: Payer: Medicare HMO | Admitting: Adult Health

## 2022-05-28 DIAGNOSIS — F331 Major depressive disorder, recurrent, moderate: Secondary | ICD-10-CM | POA: Diagnosis not present

## 2022-05-28 DIAGNOSIS — F411 Generalized anxiety disorder: Secondary | ICD-10-CM

## 2022-05-28 MED ORDER — LORAZEPAM 0.5 MG PO TABS
0.5000 mg | ORAL_TABLET | Freq: Every day | ORAL | 2 refills | Status: DC | PRN
Start: 2022-05-28 — End: 2022-12-28

## 2022-05-28 MED ORDER — DESVENLAFAXINE SUCCINATE ER 50 MG PO TB24: 50.0000 mg | ORAL_TABLET | Freq: Every day | ORAL | 3 refills | Status: DC

## 2022-05-28 NOTE — Progress Notes (Signed)
Kimberly Conway [id number] 01/10/1956 66 y.o.  Subjective:   Patient ID:  Kimberly Conway is a 66 y.o. (DOB 11/06/1955) female.  Chief Complaint: No chief complaint on file.   HPI Kimberly Conway presents to the office today for follow-up of MDD and GAD.  Describes mood today as "ok". Pleasant. Denies tearfulness. Mood symptoms - decreased depression, anxiety and irritability - "sometimes". Mood is consistent. Stating "I'm usually happy". Feels like current medications are working well. Stable interest and motivation. Taking medications as prescribed. Energy levels stable. Active, has a regular exercise routine.  Enjoys some usual interests and activities. Single. Lives alone with 2 cats. No children. Has a sister local. Other family lives in [place], [place].  Appetite adequate. Weight stable - 155 pounds, 61". Sleeps well most nights. Averages 6 to 8 hours. Focus and concentration stable. Completing tasks. Managing aspects of household. Works full time Teacher, music. Denies SI or HI.  Denies AH or VH.   Previous medication trials: Ativan, Pristiq   PHQ2-9    [facility] Office Visit from 06/05/2021 in [place] at [facility] from 05/01/2020 in [facility] at [facility] from 09/21/2019 in [place] at [facility] from 06/22/2019 in [place] at [facility] Total Score 0 0 2 0  PHQ-9 Total Score 3 0 5 4      Flowsheet Row Admission (Discharged) from 04/24/2021 in [facility] NEURO/TRAUMA/SURGICAL ICU IR 90MIN [facility]2 from 02/14/2021 in [facility] ED from 01/23/2021 in [facility] DEPT  C-SSRS RISK CATEGORY No Risk No Risk No Risk        Review of Systems:  Review of Systems  Musculoskeletal:  Negative for gait problem.  Neurological:  Negative for tremors.  Psychiatric/Behavioral:         Please refer to HPI    Medications: I have reviewed  the patient's current medications.  Current Outpatient Medications  Medication Sig Dispense Refill   aspirin EC 81 MG tablet Take 81 mg by mouth in the morning. Swallow whole.     b complex vitamins tablet Take 1 tablet by mouth in the morning.     Calcium Citrate (CITRACAL PO) Take 3 tablets by mouth daily after lunch.     cetirizine (ZYRTEC) 10 MG tablet Take 1 tablet (10 mg total) by mouth daily. 30 tablet 11   desvenlafaxine (PRISTIQ) 50 MG 24 hr tablet Take 1 tablet (50 mg total) by mouth daily. 90 tablet 3   fluticasone (FLONASE) 50 MCG/ACT nasal spray USE 2 SPRAYS IN EACH NOSTRIL EVERY DAY (Patient taking differently: Place 1 spray into both nostrils daily.) 48 g 2   levocetirizine (XYZAL) 5 MG tablet TAKE 1 TABLET (5 MG TOTAL) BY MOUTH EVERY EVENING. 90 tablet 1   LORazepam (ATIVAN) 0.5 MG tablet Take 1 tablet (0.5 mg total) by mouth daily as needed. 30 tablet 2   methylPREDNISolone (MEDROL DOSEPAK) 4 MG TBPK tablet As directed 21 tablet 0   Misc Natural Products (GLUCOSAMINE CHONDROITIN TRIPLE) TABS Take 1 tablet by mouth daily.     Multiple Vitamin (MULTIVITAMIN) tablet Take 1 tablet by mouth daily after lunch.     omeprazole (PRILOSEC) 20 MG capsule TAKE 1 CAPSULE TWICE DAILY BEFORE A MEAL 180 capsule 0   Polyethyl Glycol-Propyl Glycol (LUBRICANT EYE DROPS) 0.4-0.3 % SOLN Place 1-2 drops into both eyes 3 (three) times daily as needed (dry/irritated eyes).     Probiotic  Product (PROBIOTIC PO) Take 1 capsule by mouth in the morning.     sodium chloride (OCEAN) 0.65 % SOLN nasal spray Place 1 spray into both nostrils 2 (two) times daily.     Turmeric 500 MG CAPS Take 500 mg by mouth daily after lunch.     No current facility-administered medications for this visit.    Medication Side Effects: None  Allergies: No Known Allergies  Past Medical History:  Diagnosis Date   Anginal pain ([place])    Depression    Family history of adverse reaction to anesthesia    sister gets nauseous  after anesthesia   GAD (generalized anxiety disorder)    GERD (gastroesophageal reflux disease)    Hepatitis B 1965   HTN (hypertension)    Multiple thyroid nodules    Other atopic dermatitis 08/17/2020   Seasonal allergies    Urticaria    Vertigo     Past Medical History, Surgical history, Social history, and Family history were reviewed and updated as appropriate.   Please see review of systems for further details on the patient's review from today.   Objective:   Physical Exam:  There were no vitals taken for this visit.  Physical Exam Constitutional:      General: She is not in acute distress. Musculoskeletal:        General: No deformity.  Neurological:     Mental Status: She is alert and oriented to person, place, and time.     Coordination: Coordination normal.  Psychiatric:        Attention and Perception: Attention and perception normal. She does not perceive auditory or visual hallucinations.        Mood and Affect: Mood normal. Mood is not anxious or depressed. Affect is not labile, blunt, angry or inappropriate.        Speech: Speech normal.        Behavior: Behavior normal.        Thought Content: Thought content normal. Thought content is not paranoid or delusional. Thought content does not include homicidal or suicidal ideation. Thought content does not include homicidal or suicidal plan.        Cognition and Memory: Cognition and memory normal.        Judgment: Judgment normal.     Comments: Insight intact     Lab Review:     Component Value Date/Time   NA 140 11/14/2021 0653   K 3.7 11/14/2021 0653   CL 106 11/14/2021 0653   CO2 24 11/14/2021 0653   GLUCOSE 104 (H) 11/14/2021 0653   BUN 12 11/14/2021 0653   CREATININE 0.89 11/14/2021 0653   CALCIUM 9.7 11/14/2021 0653   PROT 7.5 07/02/2021 0816   PROT 7.5 05/01/2020 0000   ALBUMIN 4.4 07/02/2021 0816   AST 24 07/02/2021 0816   ALT 38 (H) 07/02/2021 0816   ALKPHOS 128 (H) 07/02/2021 0816    BILITOT 0.7 07/02/2021 0816   GFRNONAA >60 11/14/2021 0653       Component Value Date/Time   WBC 5.7 11/14/2021 0653   RBC 4.59 11/14/2021 0653   HGB 14.2 11/14/2021 0653   HCT 42.0 11/14/2021 0653   PLT 311 11/14/2021 0653   MCV 91.5 11/14/2021 0653   MCH 30.9 11/14/2021 0653   MCHC 33.8 11/14/2021 0653   RDW 13.1 11/14/2021 0653   LYMPHSABS 1.5 11/14/2021 0653   MONOABS 0.4 11/14/2021 0653   EOSABS 0.2 11/14/2021 0653   BASOSABS 0.0 11/14/2021 5465  No results found for: "POCLITH", "LITHIUM"   No results found for: "PHENYTOIN", "PHENOBARB", "VALPROATE", "CBMZ"   .res Assessment: Plan:    Plan:  PDMP reviewed  1. Pristiq '50mg'$  daily 2. Ativan 0.'5mg'$  daily as needed  Read and reviewed note with patient for accuracy.   RTC 12 months  Patient advised to contact office with any questions, adverse effects, or acute worsening in signs and symptoms.  Discussed potential benefits, risk, and side effects of benzodiazepines to include potential risk of tolerance and dependence, as well as possible drowsiness.  Advised patient not to drive if experiencing drowsiness and to take lowest possible effective dose to minimize risk of dependence and tolerance. Diagnoses and all orders for this visit:  Major depressive disorder, recurrent episode, moderate (HCC) -     desvenlafaxine (PRISTIQ) 50 MG 24 hr tablet; Take 1 tablet (50 mg total) by mouth daily.  Generalized anxiety disorder -     desvenlafaxine (PRISTIQ) 50 MG 24 hr tablet; Take 1 tablet (50 mg total) by mouth daily. -     LORazepam (ATIVAN) 0.5 MG tablet; Take 1 tablet (0.5 mg total) by mouth daily as needed.     Please see After Visit Summary for patient specific instructions.  No future appointments.   No orders of the defined types were placed in this encounter.   -------------------------------

## 2022-06-12 ENCOUNTER — Other Ambulatory Visit: Payer: Self-pay | Admitting: Obstetrics and Gynecology

## 2022-06-12 DIAGNOSIS — Z803 Family history of malignant neoplasm of breast: Secondary | ICD-10-CM

## 2022-06-12 DIAGNOSIS — Z1151 Encounter for screening for human papillomavirus (HPV): Secondary | ICD-10-CM | POA: Diagnosis not present

## 2022-06-12 DIAGNOSIS — Z6831 Body mass index (BMI) 31.0-31.9, adult: Secondary | ICD-10-CM | POA: Diagnosis not present

## 2022-06-12 DIAGNOSIS — Z124 Encounter for screening for malignant neoplasm of cervix: Secondary | ICD-10-CM | POA: Diagnosis not present

## 2022-06-12 DIAGNOSIS — Z1231 Encounter for screening mammogram for malignant neoplasm of breast: Secondary | ICD-10-CM | POA: Diagnosis not present

## 2022-06-30 ENCOUNTER — Other Ambulatory Visit: Payer: Medicare HMO

## 2022-07-14 ENCOUNTER — Ambulatory Visit
Admission: RE | Admit: 2022-07-14 | Discharge: 2022-07-14 | Disposition: A | Discharge status: Home / Self Care | Payer: Medicare HMO | Source: Ambulatory Visit | Attending: Obstetrics and Gynecology | Admitting: Obstetrics and Gynecology

## 2022-07-14 DIAGNOSIS — Z803 Family history of malignant neoplasm of breast: Secondary | ICD-10-CM

## 2022-07-14 DIAGNOSIS — N6489 Other specified disorders of breast: Secondary | ICD-10-CM | POA: Diagnosis not present

## 2022-07-14 MED ORDER — GADOPICLENOL 0.5 MMOL/ML IV SOLN
7.5000 mL | Freq: Once | INTRAVENOUS | Status: AC | PRN
  Administered 2022-07-14: 7.5 mL via INTRAVENOUS

## 2022-07-21 ENCOUNTER — Ambulatory Visit (INDEPENDENT_AMBULATORY_CARE_PROVIDER_SITE_OTHER): Payer: Medicare HMO | Admitting: Internal Medicine

## 2022-07-21 ENCOUNTER — Encounter: Payer: Self-pay | Admitting: Internal Medicine

## 2022-07-21 ENCOUNTER — Telehealth: Payer: Self-pay | Admitting: Internal Medicine

## 2022-07-21 VITALS — BP 119/82 | HR 82 | Temp 98.5°F | Wt 160.8 lb

## 2022-07-21 DIAGNOSIS — F32A Depression, unspecified: Secondary | ICD-10-CM | POA: Diagnosis not present

## 2022-07-21 DIAGNOSIS — F419 Anxiety disorder, unspecified: Secondary | ICD-10-CM

## 2022-07-21 DIAGNOSIS — I10 Essential (primary) hypertension: Secondary | ICD-10-CM

## 2022-07-21 LAB — CBC WITH DIFFERENTIAL/PLATELET
Basophils Absolute: 0 10*3/uL (ref 0.0–0.1)
Basophils Relative: 0.7 % (ref 0.0–3.0)
Eosinophils Absolute: 0.1 10*3/uL (ref 0.0–0.7)
Eosinophils Relative: 2 % (ref 0.0–5.0)
HCT: 41.9 % (ref 36.0–46.0)
Hemoglobin: 14.3 g/dL (ref 12.0–15.0)
Lymphocytes Relative: 28.3 % (ref 12.0–46.0)
Lymphs Abs: 1.6 10*3/uL (ref 0.7–4.0)
MCHC: 34.2 g/dL (ref 30.0–36.0)
MCV: 93 fl (ref 78.0–100.0)
Monocytes Absolute: 0.4 10*3/uL (ref 0.1–1.0)
Monocytes Relative: 7.1 % (ref 3.0–12.0)
Neutro Abs: 3.5 10*3/uL (ref 1.4–7.7)
Neutrophils Relative %: 61.9 % (ref 43.0–77.0)
Platelets: 298 10*3/uL (ref 150.0–400.0)
RBC: 4.5 Mil/uL (ref 3.87–5.11)
RDW: 12.8 % (ref 11.5–15.5)
WBC: 5.7 10*3/uL (ref 4.0–10.5)

## 2022-07-21 LAB — COMPREHENSIVE METABOLIC PANEL
ALT: 23 U/L (ref 0–35)
AST: 20 U/L (ref 0–37)
Albumin: 4.3 g/dL (ref 3.5–5.2)
Alkaline Phosphatase: 107 U/L (ref 39–117)
BUN: 12 mg/dL (ref 6–23)
CO2: 30 mEq/L (ref 19–32)
Calcium: 9.5 mg/dL (ref 8.4–10.5)
Chloride: 103 mEq/L (ref 96–112)
Creatinine, Ser: 0.78 mg/dL (ref 0.40–1.20)
GFR: 78.08 mL/min (ref >60.00)
Glucose, Bld: 97 mg/dL (ref 70–99)
Potassium: 4 mEq/L (ref 3.5–5.1)
Sodium: 138 mEq/L (ref 135–145)
Total Bilirubin: 0.7 mg/dL (ref 0.2–1.2)
Total Protein: 7.2 g/dL (ref 6.0–8.3)

## 2022-07-21 LAB — HEMOGLOBIN A1C: Hgb A1c MFr Bld: 5 % (ref 4.6–6.5)

## 2022-07-21 LAB — LIPID PANEL
Cholesterol: 188 mg/dL (ref 0–200)
HDL: 58.7 mg/dL (ref >39.00)
LDL Cholesterol: 104 mg/dL — ABNORMAL HIGH (ref 0–99)
NonHDL: 129.77
Total CHOL/HDL Ratio: 3
Triglycerides: 131 mg/dL (ref 0.0–149.0)
VLDL: 26.2 mg/dL (ref 0.0–40.0)

## 2022-07-21 LAB — TSH: TSH: 1.91 u[IU]/mL (ref 0.35–5.50)

## 2022-07-21 NOTE — Progress Notes (Signed)
Established Patient Office Visit     CC/Reason for Visit: Requesting labs  HPI: Kimberly Conway is a 67 y.o. female who is coming in today for the above mentioned reasons. Past Medical History is significant for:  Depression and anxiety followed by psychiatry, GERD and a right ICA aneurysm status post endovascular treatment.  She is now due for a wellness visit.  She is requesting labs today.  She originally made this visit yesterday for dizziness that she experienced yesterday while having increased anxiety and stress at work, however this is now resolved.   Past Medical/Surgical History: Past Medical History:  Diagnosis Date   Anginal pain ([place])    Depression    Family history of adverse reaction to anesthesia    sister gets nauseous after anesthesia   GAD (generalized anxiety disorder)    GERD (gastroesophageal reflux disease)    Hepatitis B 1965   HTN (hypertension)    Multiple thyroid nodules    Other atopic dermatitis 08/17/2020   Seasonal allergies    Urticaria    Vertigo     Past Surgical History:  Procedure Laterality Date   APPENDECTOMY     COLONOSCOPY  08/28/2019   DILATION AND CURETTAGE OF UTERUS     IR 3D INDEPENDENT WKST  04/24/2021   IR ANGIO EXTERNAL CAROTID SEL EXT CAROTID UNI R MOD SED  04/24/2021   IR ANGIO INTRA EXTRACRAN SEL COM CAROTID INNOMINATE BILAT MOD SED  11/14/2021   IR ANGIO INTRA EXTRACRAN SEL COM CAROTID INNOMINATE UNI R MOD SED  02/14/2021   IR ANGIO INTRA EXTRACRAN SEL INTERNAL CAROTID UNI L MOD SED  02/14/2021   IR ANGIO INTRA EXTRACRAN SEL INTERNAL CAROTID UNI R MOD SED  04/24/2021   IR ANGIO VERTEBRAL SEL SUBCLAVIAN INNOMINATE UNI R MOD SED  02/14/2021   IR ANGIO VERTEBRAL SEL VERTEBRAL UNI L MOD SED  02/14/2021   IR ANGIO VERTEBRAL SEL VERTEBRAL UNI L MOD SED  11/14/2021   IR CT HEAD LTD  04/24/2021   IR PTA INTRACRANIAL  04/24/2021   IR TRANSCATH/EMBOLIZ  04/24/2021   IR US GUIDE VASC ACCESS RIGHT  02/14/2021   IR US GUIDE VASC  ACCESS RIGHT  04/24/2021   IR US GUIDE VASC ACCESS RIGHT  11/14/2021   RADIOLOGY WITH ANESTHESIA N/A 04/24/2021   Procedure: EMBOLIZATION;  Surgeon: Pedro Earls, MD;  Location: [facility];  Service: Radiology;  Laterality: N/A;   UPPER GI ENDOSCOPY  08/28/2019    Social History:  reports that she quit smoking about 8 years ago. Her smoking use included cigarettes. She smoked an average of .5 packs per day. She has never used smokeless tobacco. She reports that she does not currently use alcohol. She reports that she does not use drugs.  Allergies: No Known Allergies  Family History:  Family History  Problem Relation Age of Onset   Diabetes Mother    Breast cancer Mother    CAD Father    Hypertension Sister    Breast cancer Sister    Migraines Sister    Migraines Sister    Diabetes Brother    Colon cancer Neg Hx    Esophageal cancer Neg Hx    Pancreatic cancer Neg Hx    Stomach cancer Neg Hx      Current Outpatient Medications:    aspirin EC 81 MG tablet, Take 81 mg by mouth in the morning. Swallow whole., Disp: , Rfl:    b complex vitamins  tablet, Take 1 tablet by mouth in the morning., Disp: , Rfl:    Calcium Citrate (CITRACAL PO), Take 3 tablets by mouth daily after lunch., Disp: , Rfl:    cetirizine (ZYRTEC) 10 MG tablet, Take 1 tablet (10 mg total) by mouth daily., Disp: 30 tablet, Rfl: 11   desvenlafaxine (PRISTIQ) 50 MG 24 hr tablet, Take 1 tablet (50 mg total) by mouth daily., Disp: 90 tablet, Rfl: 3   fluticasone (FLONASE) 50 MCG/ACT nasal spray, USE 2 SPRAYS IN EACH NOSTRIL EVERY DAY (Patient taking differently: Place 1 spray into both nostrils daily.), Disp: 48 g, Rfl: 2   levocetirizine (XYZAL) 5 MG tablet, TAKE 1 TABLET (5 MG TOTAL) BY MOUTH EVERY EVENING., Disp: 90 tablet, Rfl: 1   LORazepam (ATIVAN) 0.5 MG tablet, Take 1 tablet (0.5 mg total) by mouth daily as needed., Disp: 30 tablet, Rfl: 2   Misc Natural Products (GLUCOSAMINE CHONDROITIN TRIPLE)  TABS, Take 1 tablet by mouth daily., Disp: , Rfl:    Multiple Vitamin (MULTIVITAMIN) tablet, Take 1 tablet by mouth daily after lunch., Disp: , Rfl:    omeprazole (PRILOSEC) 20 MG capsule, TAKE 1 CAPSULE TWICE DAILY BEFORE A MEAL, Disp: 180 capsule, Rfl: 0   Polyethyl Glycol-Propyl Glycol (LUBRICANT EYE DROPS) 0.4-0.3 % SOLN, Place 1-2 drops into both eyes 3 (three) times daily as needed (dry/irritated eyes)., Disp: , Rfl:    Probiotic Product (PROBIOTIC PO), Take 1 capsule by mouth in the morning., Disp: , Rfl:    sodium chloride (OCEAN) 0.65 % SOLN nasal spray, Place 1 spray into both nostrils 2 (two) times daily., Disp: , Rfl:    Turmeric 500 MG CAPS, Take 500 mg by mouth daily after lunch., Disp: , Rfl:   Review of Systems:  Negative unless indicated in HPI.   Physical Exam: Vitals:   07/21/22 1123  BP: 119/82  Pulse: 82  Temp: 98.5 F (36.9 C)  TempSrc: Oral  SpO2: 96%  Weight: 160 lb 12.8 oz (72.9 kg)    Body mass index is 26.76 kg/m.   Physical Exam Vitals reviewed.  Constitutional:      Appearance: Normal appearance.  HENT:     Head: Normocephalic and atraumatic.  Eyes:     Conjunctiva/sclera: Conjunctivae normal.     Pupils: Pupils are equal, round, and reactive to light.  Cardiovascular:     Rate and Rhythm: Normal rate and regular rhythm.  Pulmonary:     Effort: Pulmonary effort is normal.     Breath sounds: Normal breath sounds.  Skin:    General: Skin is warm and dry.  Neurological:     General: No focal deficit present.     Mental Status: She is alert and oriented to person, place, and time.  Psychiatric:        Mood and Affect: Mood normal.        Behavior: Behavior normal.        Thought Content: Thought content normal.        Judgment: Judgment normal.      Impression and Plan:  Primary hypertension - Plan: CBC with Differential/Platelet, Comprehensive metabolic panel, Lipid panel  Anxiety and depression - Plan: Hemoglobin A1c, TSH  -Labs  ordered per request, she will discuss her increased anxiety with her psychiatrist at next visit.   Time spent:23 minutes reviewing chart, interviewing and examining patient and formulating plan of care.     Lelon Frohlich, MD [facility]

## 2022-07-21 NOTE — Telephone Encounter (Signed)
Will discuss with patient at office visit

## 2022-07-21 NOTE — Telephone Encounter (Signed)
Pt will be here at 11:30 am and would like to have labs done, as well.  Pt is fasting in anticipation of MD saying yes to labs. Pt would like a call back as soon as possible to let her know if she can do labs.

## 2022-07-22 ENCOUNTER — Encounter: Payer: Self-pay | Admitting: Internal Medicine

## 2022-07-22 ENCOUNTER — Other Ambulatory Visit: Payer: Self-pay | Admitting: Internal Medicine

## 2022-07-22 DIAGNOSIS — J309 Allergic rhinitis, unspecified: Secondary | ICD-10-CM

## 2022-07-24 ENCOUNTER — Other Ambulatory Visit: Payer: Self-pay | Admitting: Internal Medicine

## 2022-07-24 DIAGNOSIS — J302 Other seasonal allergic rhinitis: Secondary | ICD-10-CM

## 2022-08-02 ENCOUNTER — Other Ambulatory Visit: Payer: Self-pay | Admitting: Internal Medicine

## 2022-10-05 ENCOUNTER — Telehealth: Payer: Self-pay | Admitting: Adult Health

## 2022-10-05 NOTE — Telephone Encounter (Signed)
Upcoming appt

## 2022-10-05 NOTE — Telephone Encounter (Signed)
Patient called in for refill on Lorazepam 0.5mg  and Pristiq 50mg . Ph: [phone] Pharmacy [facility] [address] [place]

## 2022-10-05 NOTE — Telephone Encounter (Signed)
Patient has RFs available for both medications. She was notified.

## 2022-12-25 ENCOUNTER — Ambulatory Visit (INDEPENDENT_AMBULATORY_CARE_PROVIDER_SITE_OTHER): Payer: Medicare HMO | Admitting: Adult Health

## 2022-12-25 DIAGNOSIS — F411 Generalized anxiety disorder: Secondary | ICD-10-CM

## 2022-12-25 DIAGNOSIS — F331 Major depressive disorder, recurrent, moderate: Secondary | ICD-10-CM | POA: Diagnosis not present

## 2022-12-27 ENCOUNTER — Other Ambulatory Visit: Payer: Self-pay | Admitting: Internal Medicine

## 2022-12-28 ENCOUNTER — Other Ambulatory Visit: Payer: Self-pay

## 2022-12-28 ENCOUNTER — Encounter: Payer: Self-pay | Admitting: Adult Health

## 2022-12-28 ENCOUNTER — Telehealth: Payer: Self-pay | Admitting: Adult Health

## 2022-12-28 DIAGNOSIS — F411 Generalized anxiety disorder: Secondary | ICD-10-CM

## 2022-12-28 MED ORDER — LORAZEPAM 0.5 MG PO TABS: 0.5000 mg | ORAL_TABLET | Freq: Every day | ORAL | 2 refills | Status: DC | PRN

## 2022-12-28 NOTE — Telephone Encounter (Signed)
Pt called and needs a refill on her ativan. Pharmacy is [facility] [address]

## 2022-12-28 NOTE — Progress Notes (Signed)
Kimberly Conway [id number] [birth year] 67 y.o.  Subjective:   Patient ID:  Kimberly Conway is a 67 y.o. (DOB 1955-12-03) female.  Chief Complaint: No chief complaint on file.   HPI Kimberly Conway presents to the office today for follow-up of MDD and GAD.  Describes mood today as "not the best". Pleasant. Reports tearfulness. Mood symptoms - some depression, anxiety and irritability. Reports panic attacks. Reports worry, rumination, and over thinking. Mood is variable. Stating "I'm not doing too good right now". Reports some issues with current living situation, but is unable to move. Plans to seek advice from legal aid. Feels like current medications are helpful. Stating "things have been difficult for me lately". Stable interest and motivation. Taking medications as prescribed. Energy levels stable. Active, does not have a regular exercise routine.  Enjoys some usual interests and activities. Single. Lives alone with 2 cats. No children. Has a sister local. Other family lives in [place], [place].  Appetite adequate. Weight stable - 155 pounds, 61". Sleeps well most nights. Averages 6 to 8 hours. Focus and concentration stable. Completing tasks. Managing aspects of household. Works full time - report changing jobs. Denies SI or HI.  Denies AH or VH.  Denies self harm. Denies substance use.  Previous medication trials: Ativan, Pristiq   PHQ2-9    Flowsheet Row Office Visit from 06/05/2021 in [facility] [place] Office Visit from 05/01/2020 in [facility] [place] Office Visit from 09/21/2019 in [facility] [place] Office Visit from 06/22/2019 in [facility] [place]  PHQ-2 Total Score 0 0 2 0  PHQ-9 Total Score 3 0 5 4      Flowsheet Row Admission (Discharged) from 04/24/2021 in [facility] NEURO/TRAUMA/SURGICAL ICU IR MCIR2 from 02/14/2021 in [facility] INTERVENTIONAL RADIOLOGY ED  from 01/23/2021 in [facility] Emergency Department at [facility]  C-SSRS RISK CATEGORY No Risk No Risk No Risk        Review of Systems:  Review of Systems  Musculoskeletal:  Negative for gait problem.  Neurological:  Negative for tremors.  Psychiatric/Behavioral:         Please refer to HPI    Medications: I have reviewed the patient's current medications.  Current Outpatient Medications  Medication Sig Dispense Refill   aspirin EC 81 MG tablet Take 81 mg by mouth in the morning. Swallow whole.     b complex vitamins tablet Take 1 tablet by mouth in the morning.     Calcium Citrate (CITRACAL PO) Take 3 tablets by mouth daily after lunch.     cetirizine (ZYRTEC) 10 MG tablet TAKE 1 TABLET EVERY DAY 90 tablet 1   desvenlafaxine (PRISTIQ) 50 MG 24 hr tablet Take 1 tablet (50 mg total) by mouth daily. 90 tablet 3   fluticasone (FLONASE) 50 MCG/ACT nasal spray USE 2 SPRAYS IN EACH NOSTRIL EVERY DAY 48 g 3   levocetirizine (XYZAL) 5 MG tablet TAKE 1 TABLET (5 MG TOTAL) BY MOUTH EVERY EVENING. 90 tablet 1   LORazepam (ATIVAN) 0.5 MG tablet Take 1 tablet (0.5 mg total) by mouth daily as needed. 30 tablet 2   Misc Natural Products (GLUCOSAMINE CHONDROITIN TRIPLE) TABS Take 1 tablet by mouth daily.     Multiple Vitamin (MULTIVITAMIN) tablet Take 1 tablet by mouth daily after lunch.     omeprazole (PRILOSEC) 20 MG capsule TAKE 1 CAPSULE TWICE DAILY BEFORE MEALS 180 capsule 1  Polyethyl Glycol-Propyl Glycol (LUBRICANT EYE DROPS) 0.4-0.3 % SOLN Place 1-2 drops into both eyes 3 (three) times daily as needed (dry/irritated eyes).     Probiotic Product (PROBIOTIC PO) Take 1 capsule by mouth in the morning.     sodium chloride (OCEAN) 0.65 % SOLN nasal spray Place 1 spray into both nostrils 2 (two) times daily.     Turmeric 500 MG CAPS Take 500 mg by mouth daily after lunch.     No current facility-administered medications for this visit.    Medication Side Effects: None  Allergies:  No Known Allergies  Past Medical History:  Diagnosis Date   Anginal pain ([facility])    Depression    Family history of adverse reaction to anesthesia    sister gets nauseous after anesthesia   GAD (generalized anxiety disorder)    GERD (gastroesophageal reflux disease)    Hepatitis B 1965   HTN (hypertension)    Multiple thyroid nodules    Other atopic dermatitis 08/17/2020   Seasonal allergies    Urticaria    Vertigo     Past Medical History, Surgical history, Social history, and Family history were reviewed and updated as appropriate.   Please see review of systems for further details on the patient's review from today.   Objective:   Physical Exam:  There were no vitals taken for this visit.  Physical Exam Constitutional:      General: She is not in acute distress. Musculoskeletal:        General: No deformity.  Neurological:     Mental Status: She is alert and oriented to person, place, and time.     Coordination: Coordination normal.  Psychiatric:        Attention and Perception: Attention and perception normal. She does not perceive auditory or visual hallucinations.        Mood and Affect: Affect is not labile, blunt, angry or inappropriate.        Speech: Speech normal.        Behavior: Behavior normal.        Thought Content: Thought content normal. Thought content is not paranoid or delusional. Thought content does not include homicidal or suicidal ideation. Thought content does not include homicidal or suicidal plan.        Cognition and Memory: Cognition and memory normal.        Judgment: Judgment normal.     Comments: Insight intact     Lab Review:     Component Value Date/Time   NA 138 07/21/2022 1151   K 4.0 07/21/2022 1151   CL 103 07/21/2022 1151   CO2 30 07/21/2022 1151   GLUCOSE 97 07/21/2022 1151   BUN 12 07/21/2022 1151   CREATININE 0.78 07/21/2022 1151   CALCIUM 9.5 07/21/2022 1151   PROT 7.2 07/21/2022 1151   PROT 7.5 05/01/2020 0000    ALBUMIN 4.3 07/21/2022 1151   AST 20 07/21/2022 1151   ALT 23 07/21/2022 1151   ALKPHOS 107 07/21/2022 1151   BILITOT 0.7 07/21/2022 1151   GFRNONAA >60 11/14/2021 0653       Component Value Date/Time   WBC 5.7 07/21/2022 1151   RBC 4.50 07/21/2022 1151   HGB 14.3 07/21/2022 1151   HCT 41.9 07/21/2022 1151   PLT 298.0 07/21/2022 1151   MCV 93.0 07/21/2022 1151   MCH 30.9 11/14/2021 0653   MCHC 34.2 07/21/2022 1151   RDW 12.8 07/21/2022 1151   LYMPHSABS 1.6 07/21/2022 1151   MONOABS 0.4  07/21/2022 1151   EOSABS 0.1 07/21/2022 1151   BASOSABS 0.0 07/21/2022 1151    No results found for: "POCLITH", "LITHIUM"   No results found for: "PHENYTOIN", "PHENOBARB", "VALPROATE", "CBMZ"   .res Assessment: Plan:    Plan:  PDMP reviewed  1. Pristiq 50mg  daily 2. Ativan 0.5mg  daily as needed  Read and reviewed note with patient for accuracy.   RTC 6 months  Patient advised to contact office with any questions, adverse effects, or acute worsening in signs and symptoms.  Discussed potential benefits, risk, and side effects of benzodiazepines to include potential risk of tolerance and dependence, as well as possible drowsiness.  Advised patient not to drive if experiencing drowsiness and to take lowest possible effective dose to minimize risk of dependence and tolerance. There are no diagnoses linked to this encounter.   Please see After Visit Summary for patient specific instructions.  No future appointments.  No orders of the defined types were placed in this encounter.   -------------------------------

## 2022-12-28 NOTE — Telephone Encounter (Signed)
Pended.

## 2023-01-01 ENCOUNTER — Telehealth: Payer: Self-pay | Admitting: Adult Health

## 2023-01-01 NOTE — Telephone Encounter (Signed)
Addendum to attached message. Kimberly Conway called back and said that this letter she needs is critical and needs by Monday.

## 2023-01-01 NOTE — Telephone Encounter (Signed)
Pt called reporting the ESA letter written last. Now asking why she has to have 2 cats. Pt advised the one cat is very old and in case something happens to that cat. She will already have the other cat for support. Due to her condition this is best for pt. Please write an additional letter stating why 2 cats needed. And advise pt when ready for pick up.

## 2023-01-15 ENCOUNTER — Ambulatory Visit (INDEPENDENT_AMBULATORY_CARE_PROVIDER_SITE_OTHER): Payer: Medicare HMO | Admitting: Family Medicine

## 2023-01-15 VITALS — BP 120/80 | HR 67 | Temp 98.5°F | Resp 16 | Ht 65.0 in | Wt 160.2 lb

## 2023-01-15 DIAGNOSIS — L538 Other specified erythematous conditions: Secondary | ICD-10-CM

## 2023-01-15 DIAGNOSIS — B354 Tinea corporis: Secondary | ICD-10-CM | POA: Diagnosis not present

## 2023-01-15 MED ORDER — FLUCONAZOLE 150 MG PO TABS: 150.0000 mg | ORAL_TABLET | ORAL | 0 refills | Status: AC

## 2023-01-15 MED ORDER — CLOTRIMAZOLE-BETAMETHASONE 1-0.05 % EX CREA: 1.0000 | TOPICAL_CREAM | Freq: Two times a day (BID) | CUTANEOUS | 0 refills | Status: AC

## 2023-01-15 NOTE — Progress Notes (Signed)
ACUTE VISIT Chief Complaint  Patient presents with   skin concern    Pt c/o of itchy, skin peeling on L pointing finger. Going on for 3 months. Tried fungus cream, hydrocortisone, anti-septic spray, anti-biotic cream. Sx did not go away.  Also concern of red spot on skin started on R hand, now to L hand. Pt reports her cat have had ring worm prior.    HPI: Kimberly Conway is a 67 y.o. female, who is here today complaining of mildly pruritic and peeling areas on the left finger and dorsum of hands for the past three months. Despite trying various treatments as described above the symptoms have persisted.  Rash The current episode started more than 1 month ago. The problem has been waxing and waning since onset. The affected locations include the right hand and left hand. The rash is characterized by dryness and itchiness. She was exposed to nothing. Pertinent negatives include no anorexia, congestion, cough, eye pain, facial edema, fatigue, fever, joint pain, nail changes, rhinorrhea, shortness of breath, sore throat or vomiting. Past treatments include moisturizer, topical steroids, anti-itch cream and antibiotic cream. The treatment provided mild relief. Her past medical history is significant for eczema.   She mentions that their cat had a similar rash, which was diagnosed as ringworm and successfully treated. Notably, her rash began after the cat's condition resolved. The intensity of the pruritus varies. Area around left index finger is sometimes tender.  She denies any new products or exposure before the onset of the rash.   Review of Systems  Constitutional:  Negative for activity change, appetite change, fatigue and fever.  HENT:  Negative for congestion, rhinorrhea and sore throat.   Eyes:  Negative for pain.  Respiratory:  Negative for cough and shortness of breath.   Gastrointestinal:  Negative for abdominal pain, anorexia, nausea and vomiting.  Musculoskeletal:  Negative for  arthralgias, joint pain and joint swelling.  Skin:  Positive for rash. Negative for nail changes.  Neurological:  Negative for weakness and numbness.  See other pertinent positives and negatives in HPI.  Current Outpatient Medications on File Prior to Visit  Medication Sig Dispense Refill   aspirin EC 81 MG tablet Take 81 mg by mouth in the morning. Swallow whole.     b complex vitamins tablet Take 1 tablet by mouth in the morning.     Calcium Citrate (CITRACAL PO) Take 3 tablets by mouth daily after lunch.     cetirizine (ZYRTEC) 10 MG tablet TAKE 1 TABLET EVERY DAY 90 tablet 1   desvenlafaxine (PRISTIQ) 50 MG 24 hr tablet Take 1 tablet (50 mg total) by mouth daily. 90 tablet 3   fluticasone (FLONASE) 50 MCG/ACT nasal spray USE 2 SPRAYS IN EACH NOSTRIL EVERY DAY 48 g 3   levocetirizine (XYZAL) 5 MG tablet TAKE 1 TABLET (5 MG TOTAL) BY MOUTH EVERY EVENING. 90 tablet 1   LORazepam (ATIVAN) 0.5 MG tablet Take 1 tablet (0.5 mg total) by mouth daily as needed. 30 tablet 2   Misc Natural Products (GLUCOSAMINE CHONDROITIN TRIPLE) TABS Take 1 tablet by mouth daily.     Multiple Vitamin (MULTIVITAMIN) tablet Take 1 tablet by mouth daily after lunch.     omeprazole (PRILOSEC) 20 MG capsule TAKE 1 CAPSULE TWICE DAILY BEFORE MEALS 180 capsule 1   Polyethyl Glycol-Propyl Glycol (LUBRICANT EYE DROPS) 0.4-0.3 % SOLN Place 1-2 drops into both eyes 3 (three) times daily as needed (dry/irritated eyes).     Probiotic  Product (PROBIOTIC PO) Take 1 capsule by mouth in the morning.     Turmeric 500 MG CAPS Take 500 mg by mouth daily after lunch.     sodium chloride (OCEAN) 0.65 % SOLN nasal spray Place 1 spray into both nostrils 2 (two) times daily. (Patient not taking: Reported on 01/15/2023)     No current facility-administered medications on file prior to visit.    Past Medical History:  Diagnosis Date   Anginal pain ([facility])    Depression    Family history of adverse reaction to anesthesia    sister gets  nauseous after anesthesia   GAD (generalized anxiety disorder)    GERD (gastroesophageal reflux disease)    Hepatitis B 1965   HTN (hypertension)    Multiple thyroid nodules    Other atopic dermatitis 08/17/2020   Seasonal allergies    Urticaria    Vertigo    No Known Allergies  Social History   Socioeconomic History   Marital status: Single    Spouse name: Not on file   Number of children: Not on file   Years of education: Not on file   Highest education level: Associate degree: academic program  Occupational History   Not on file  Tobacco Use   Smoking status: Former    Current packs/day: 0.00    Types: Cigarettes    Quit date: 06/21/2014    Years since quitting: 8.5   Smokeless tobacco: Never  Vaping Use   Vaping status: Never Used  Substance and Sexual Activity   Alcohol use: Not Currently    Comment: Quit in 2016   Drug use: Never   Sexual activity: Not Currently    Birth control/protection: Post-menopausal  Other Topics Concern   Not on file  Social History Narrative   Right handed   Social Determinants of Health   Financial Resource Strain: Patient Declined (01/15/2023)   Overall Financial Resource Strain (CARDIA)    Difficulty of Paying Living Expenses: Patient declined  Food Insecurity: Patient Declined (01/15/2023)   Hunger Vital Sign    Worried About Running Out of Food in the Last Year: Patient declined    Ran Out of Food in the Last Year: Patient declined  Transportation Needs: No Transportation Needs (01/15/2023)   PRAPARE - Administrator, Civil Service (Medical): No    Lack of Transportation (Non-Medical): No  Physical Activity: Sufficiently Active (01/15/2023)   Exercise Vital Sign    Days of Exercise per Week: 5 days    Minutes of Exercise per Session: 30 min  Stress: No Stress Concern Present (01/15/2023)   [employer] of Occupational Health - Occupational Stress Questionnaire    Feeling of Stress : Only a little  Social  Connections: Unknown (01/15/2023)   Social Connection and Isolation Panel [NHANES]    Frequency of Communication with Friends and Family: Not on file    Frequency of Social Gatherings with Friends and Family: Once a week    Attends Religious Services: Not on Marketing executive or Organizations: Yes    Attends Banker Meetings: Not on file    Marital Status: Divorced    Vitals:   01/15/23 1140  BP: 120/80  Pulse: 67  Resp: 16  Temp: 98.5 F (36.9 C)  SpO2: 95%   Body mass index is 26.66 kg/m.  Physical Exam Vitals and nursing note reviewed.  Constitutional:      General: She is not in acute distress.  Appearance: She is well-developed.  HENT:     Head: Normocephalic and atraumatic.     Mouth/Throat:     Mouth: Mucous membranes are moist.     Pharynx: Oropharynx is clear.  Eyes:     Conjunctiva/sclera: Conjunctivae normal.  Cardiovascular:     Rate and Rhythm: Normal rate and regular rhythm.     Heart sounds: No murmur heard. Pulmonary:     Effort: Pulmonary effort is normal. No respiratory distress.     Breath sounds: Normal breath sounds.  Lymphadenopathy:     Cervical: No cervical adenopathy.  Skin:    General: Skin is warm.     Findings: Rash present. Rash is macular.     Comments: Scaly erythematous left periungual area of left index finger. Right wrist, radial aspect and left hans dorsal area (x 2) with macular erythematous lesions, center mildly clear, and fine scaly. Not induration or tenderness.  Neurological:     Mental Status: She is alert and oriented to person, place, and time.  Psychiatric:        Mood and Affect: Mood and affect normal.    ASSESSMENT AND PLAN:  Ms. Wisnewski was seen today fpr mildly pruritic rash on hands.  Macular erythematous rash Problem has been going on for a couple of months. We discussed possible etiologies. Eczema vs tinea. If not greatly improved in 2-3 weeks, she was instructed to let me know, so  dermatology referral can be placed.  -     Clotrimazole-Betamethasone; Apply 1 Application topically 2 (two) times daily for 21 days.  Dispense: 42 g; Refill: 0  Tinea corporis Recommend Diflucan 150 mg weekly x 3 and topical Lotrisone. Lotrisone cream bid x up to 21 days , small amount, apply at bedtime and put on cotton gloves. Side effects discussed.  -     Fluconazole; Take 1 tablet (150 mg total) by mouth once a week for 3 doses.  Dispense: 3 tablet; Refill: 0 -     Clotrimazole-Betamethasone; Apply 1 Application topically 2 (two) times daily for 21 days.  Dispense: 42 g; Refill: 0  Return if symptoms worsen or fail to improve.  Kaneisha Ellenberger G. [place], MD  [facility]. [place] office.

## 2023-01-15 NOTE — Patient Instructions (Addendum)
A few things to remember from today's visit:  Tinea corporis - Plan: fluconazole (DIFLUCAN) 150 MG tablet, clotrimazole-betamethasone (LOTRISONE) cream  Macular erythematous rash - Plan: clotrimazole-betamethasone (LOTRISONE) cream I am treating as fungal. Diflucan weekly for 3 weeks and topical medication.  If not greatly improve in 3 weeks, let me know, so we can place dermatology referral.  If you need refills for medications you take chronically, please call your pharmacy. Do not use My Chart to request refills or for acute issues that need immediate attention. If you send a my chart message, it may take a few days to be addressed, specially if I am not in the office.  Please be sure medication list is accurate. If a new problem present, please set up appointment sooner than planned today.

## 2023-01-31 ENCOUNTER — Encounter: Payer: Self-pay | Admitting: Internal Medicine

## 2023-01-31 DIAGNOSIS — L538 Other specified erythematous conditions: Secondary | ICD-10-CM

## 2023-02-19 NOTE — Telephone Encounter (Signed)
Pt calling in regarding referral [id number], complaining that she has not been called for scheduling yet. Explained process of a referral and advised of the date referral was sent to [facility]. Provided patient with phone number to [facility]. Says she is upset that she has not been called yet and requested to speak with office manager to launch complaint against PCP/assistant. Pt says it's bad customer service. Again, explained that when we send the referral over the receiving provider then calls to schedule. Patient still angry at not receiving a call yet.

## 2023-03-03 DIAGNOSIS — Z0100 Encounter for examination of eyes and vision without abnormal findings: Secondary | ICD-10-CM | POA: Diagnosis not present

## 2023-03-03 DIAGNOSIS — H5213 Myopia, bilateral: Secondary | ICD-10-CM | POA: Diagnosis not present

## 2023-03-10 ENCOUNTER — Encounter: Payer: Self-pay | Admitting: Internal Medicine

## 2023-03-18 ENCOUNTER — Ambulatory Visit (INDEPENDENT_AMBULATORY_CARE_PROVIDER_SITE_OTHER): Payer: Medicare HMO | Admitting: Family Medicine

## 2023-03-18 DIAGNOSIS — B352 Tinea manuum: Secondary | ICD-10-CM | POA: Diagnosis not present

## 2023-03-18 NOTE — Progress Notes (Signed)
error 

## 2023-03-30 ENCOUNTER — Ambulatory Visit (INDEPENDENT_AMBULATORY_CARE_PROVIDER_SITE_OTHER): Payer: Medicare HMO | Admitting: Family Medicine

## 2023-03-30 DIAGNOSIS — Z0000 Encounter for general adult medical examination without abnormal findings: Secondary | ICD-10-CM | POA: Diagnosis not present

## 2023-03-30 DIAGNOSIS — E2839 Other primary ovarian failure: Secondary | ICD-10-CM | POA: Diagnosis not present

## 2023-03-30 NOTE — Progress Notes (Signed)
"  Patient was unable to self-report due to a lack of equipment at home via telehealth"

## 2023-03-30 NOTE — Progress Notes (Signed)
PATIENT CHECK-IN and HEALTH RISK ASSESSMENT QUESTIONNAIRE:  -completed by phone/video for upcoming Medicare Preventive Visit  Pre-Visit Check-in: 1)Vitals (height, wt, BP, etc) - record in vitals section for visit on day of visit Request home vitals (wt, BP, etc.) and enter into vitals, THEN update Vital Signs SmartPhrase below at the top of the HPI. See below.  2)Review and Update Medications, Allergies PMH, Surgeries, Social history in Epic 3)Hospitalizations in the last year with date/reason? No   4)Review and Update Care Team (patient's specialists) in Epic 5) Complete PHQ9 in Epic  6) Complete Fall Screening in Epic 7)Review all Health Maintenance Due and order under PCP if not done.  8)Medicare Wellness Questionnaire: Answer theses question about your habits: Do you drink alcohol? NO  If yes, how many drinks do you have a day? NA Have you ever smoked?Yes  Quit date if applicable? 20 years ago   How many packs a day do/did you smoke? NA Do you use smokeless tobacco?No  Do you use an illicit drugs?NO  Do you exercises? Yes IF so, what type and how many days/minutes per week?30 Mins a Day  5 Days a week, Total gym and walking  Are you sexually active? NO Number of partners?NA Typical breakfast:coffee, bone both, shake, fruit, oatmeal Typical lunch:salad, meat  Typical dinner:oatmeal, sandwich Typical snacks: coffee, apple   Beverages: coffee, water, tea   Answer theses question about you: Can you perform most household chores?yes  Do you find it hard to follow a conversation in a noisy room? yes  Do you often ask people to speak up or repeat themselves? yes Do you feel that you have a problem with memory?NO  Do you balance your checkbook and or bank acounts?Yes  Do you feel safe at home?Yes  Last dentist visit?6 month ago  Do you need assistance with any of the following: Please note if so NO   Driving? N  Feeding yourself?N  Getting from bed to chair?N   Getting to the  toilet? N  Bathing or showering? N  Dressing yourself? N  Managing money? N   Climbing a flight of stairs? N   Preparing meals? N   Do you have Advanced Directives in place (Living Will, Healthcare Power or Attorney)?  Yes    Last eye Exam and location?Sept 21, 2024, [facility]   Do you currently use prescribed or non-prescribed narcotic or opioid pain medications? No   Do you have a history or close family history of breast, ovarian, tubal or peritoneal cancer or a family member with BRCA (breast cancer susceptibility 1 and 2) gene mutations? YES   Request home vitals (wt, BP, etc.) and enter into vitals, THEN update Vital Signs SmartPhrase below at the top of the HPI. See below.   Nurse/Assistant Credentials/time stamp: Philipp Deputy CMA 9:37am    ----------------------------------------------------------------------------------------------------------------------------------------------------------------------------------------------------------------------  Because this visit was a virtual/telehealth visit, some criteria may be missing or patient reported. Any vitals not documented were not able to be obtained and vitals that have been documented are patient reported.    MEDICARE ANNUAL PREVENTIVE VISIT WITH PROVIDER: (Welcome to Medicare, initial annual wellness or annual wellness exam)  Virtual Visit via Phone Note  I connected with Kimberly Conway on 03/30/23 by phone and verified that I am speaking with the correct person using two identifiers.  Location patient: home Location provider:work or home office Persons participating in the virtual visit: patient, provider  Concerns and/or follow up today: she is unhappy with  the CMA she spoke with this morning for checkin . Reports the individual was "cold" and did not explain who she was. She also is frustrated with interaction with  her primary provider office about a recent referral. Reports was referred to dermatology  and nobody called her. She then found out no dermatologist in [place] was seeing patients until next year.  She eventual arranged her own visit in [place] and saw a dermatologist.   See HM section in Epic for other details of completed HM.    ROS: negative for report of fevers, unintentional weight loss, vision changes, vision loss, hearing loss or change, chest pain, sob, hemoptysis, melena, hematochezia, hematuria, falls, bleeding or bruising, thoughts of suicide or self harm, memory loss  Patient-completed extensive health risk assessment - reviewed and discussed with the patient: See Health Risk Assessment completed with patient prior to the visit either above or in recent phone note. This was reviewed in detailed with the patient today and appropriate recommendations, orders and referrals were placed as needed per Summary below and patient instructions.   Review of Medical History: -PMH, PSH, Family History and current specialty and care providers reviewed and updated and listed below   Patient Care Team: Philip Aspen, Limmie Patricia, MD as PCP - General (Internal Medicine)   Past Medical History:  Diagnosis Date   Anginal pain [facility])    Depression    Family history of adverse reaction to anesthesia    sister gets nauseous after anesthesia   GAD (generalized anxiety disorder)    GERD (gastroesophageal reflux disease)    Hepatitis B 1965   HTN (hypertension)    Multiple thyroid nodules    Other atopic dermatitis 08/17/2020   Seasonal allergies    Urticaria    Vertigo     Past Surgical History:  Procedure Laterality Date   APPENDECTOMY     COLONOSCOPY  08/28/2019   DILATION AND CURETTAGE OF UTERUS     IR 3D INDEPENDENT WKST  04/24/2021   IR ANGIO EXTERNAL CAROTID SEL EXT CAROTID UNI R MOD SED  04/24/2021   IR ANGIO INTRA EXTRACRAN SEL COM CAROTID INNOMINATE BILAT MOD SED  11/14/2021   IR ANGIO INTRA EXTRACRAN SEL COM CAROTID INNOMINATE UNI R MOD SED  02/14/2021   IR ANGIO  INTRA EXTRACRAN SEL INTERNAL CAROTID UNI L MOD SED  02/14/2021   IR ANGIO INTRA EXTRACRAN SEL INTERNAL CAROTID UNI R MOD SED  04/24/2021   IR ANGIO VERTEBRAL SEL SUBCLAVIAN INNOMINATE UNI R MOD SED  02/14/2021   IR ANGIO VERTEBRAL SEL VERTEBRAL UNI L MOD SED  02/14/2021   IR ANGIO VERTEBRAL SEL VERTEBRAL UNI L MOD SED  11/14/2021   IR CT HEAD LTD  04/24/2021   IR PTA INTRACRANIAL  04/24/2021   IR TRANSCATH/EMBOLIZ  04/24/2021   IR US GUIDE VASC ACCESS RIGHT  02/14/2021   IR US GUIDE VASC ACCESS RIGHT  04/24/2021   IR US GUIDE VASC ACCESS RIGHT  11/14/2021   RADIOLOGY WITH ANESTHESIA N/A 04/24/2021   Procedure: EMBOLIZATION;  Surgeon: Baldemar Lenis, MD;  Location: [facility];  Service: Radiology;  Laterality: N/A;   UPPER GI ENDOSCOPY  08/28/2019    Social History   Socioeconomic History   Marital status: Single    Spouse name: Not on file   Number of children: Not on file   Years of education: Not on file   Highest education level: Associate degree: academic program  Occupational History   Not on file  Tobacco Use   Smoking status: Former    Current packs/day: 0.00    Types: Cigarettes    Quit date: 06/21/2014    Years since quitting: 8.7   Smokeless tobacco: Never  Vaping Use   Vaping status: Never Used  Substance and Sexual Activity   Alcohol use: Not Currently    Comment: Quit in 2016   Drug use: Never   Sexual activity: Not Currently    Birth control/protection: Post-menopausal  Other Topics Concern   Not on file  Social History Narrative   Right handed   Social Determinants of Health   Financial Resource Strain: Patient Declined (01/15/2023)   Overall Financial Resource Strain (CARDIA)    Difficulty of Paying Living Expenses: Patient declined  Food Insecurity: Patient Declined (01/15/2023)   Hunger Vital Sign    Worried About Running Out of Food in the Last Year: Patient declined    Ran Out of Food in the Last Year: Patient declined  Transportation Needs:  No Transportation Needs (01/15/2023)   PRAPARE - Administrator, Civil Service (Medical): No    Lack of Transportation (Non-Medical): No  Physical Activity: Sufficiently Active (01/15/2023)   Exercise Vital Sign    Days of Exercise per Week: 5 days    Minutes of Exercise per Session: 30 min  Stress: No Stress Concern Present (01/15/2023)   [employer] of Occupational Health - Occupational Stress Questionnaire    Feeling of Stress : Only a little  Social Connections: Unknown (01/15/2023)   Social Connection and Isolation Panel [NHANES]    Frequency of Communication with Friends and Family: Not on file    Frequency of Social Gatherings with Friends and Family: Once a week    Attends Religious Services: Not on Marketing executive or Organizations: Yes    Attends Banker Meetings: Not on file    Marital Status: Divorced  Intimate Partner Violence: Not on file    Family History  Problem Relation Age of Onset   Diabetes Mother    Breast cancer Mother    CAD Father    Hypertension Sister    Breast cancer Sister    Migraines Sister    Migraines Sister    Diabetes Brother    Colon cancer Neg Hx    Esophageal cancer Neg Hx    Pancreatic cancer Neg Hx    Stomach cancer Neg Hx     Current Outpatient Medications on File Prior to Visit  Medication Sig Dispense Refill   aspirin EC 81 MG tablet Take 81 mg by mouth in the morning. Swallow whole.     b complex vitamins tablet Take 1 tablet by mouth in the morning.     Calcium Citrate (CITRACAL PO) Take 3 tablets by mouth daily after lunch.     cetirizine (ZYRTEC) 10 MG tablet TAKE 1 TABLET EVERY DAY 90 tablet 1   desvenlafaxine (PRISTIQ) 50 MG 24 hr tablet Take 1 tablet (50 mg total) by mouth daily. 90 tablet 3   fluticasone (FLONASE) 50 MCG/ACT nasal spray USE 2 SPRAYS IN EACH NOSTRIL EVERY DAY 48 g 3   levocetirizine (XYZAL) 5 MG tablet TAKE 1 TABLET (5 MG TOTAL) BY MOUTH EVERY EVENING. 90 tablet 1    LORazepam (ATIVAN) 0.5 MG tablet Take 1 tablet (0.5 mg total) by mouth daily as needed. 30 tablet 2   Misc Natural Products (GLUCOSAMINE CHONDROITIN TRIPLE) TABS Take 1 tablet by mouth daily.  Multiple Vitamin (MULTIVITAMIN) tablet Take 1 tablet by mouth daily after lunch.     omeprazole (PRILOSEC) 20 MG capsule TAKE 1 CAPSULE TWICE DAILY BEFORE MEALS 180 capsule 1   Polyethyl Glycol-Propyl Glycol (LUBRICANT EYE DROPS) 0.4-0.3 % SOLN Place 1-2 drops into both eyes 3 (three) times daily as needed (dry/irritated eyes).     Probiotic Product (PROBIOTIC PO) Take 1 capsule by mouth in the morning.     sodium chloride (OCEAN) 0.65 % SOLN nasal spray Place 1 spray into both nostrils 2 (two) times daily.     terbinafine (LAMISIL) 250 MG tablet Take 250 mg by mouth daily.     Turmeric 500 MG CAPS Take 500 mg by mouth daily after lunch.     No current facility-administered medications on file prior to visit.    No Known Allergies     Physical Exam Vitals requested from patient and listed below if patient had equipment and was able to obtain at home for this virtual visit: There were no vitals filed for this visit. Estimated body mass index is 26.66 kg/m as calculated from the following:   Height as of 01/15/23: 5\' 5"  (1.651 m).   Weight as of 01/15/23: 160 lb 3.2 oz (72.7 kg).  EKG (optional): deferred due to virtual visit  GENERAL: alert, oriented, no acute distress detected, full vision exam deferred due to pandemic and/or virtual encounter  PSYCH/NEURO: pleasant and cooperative, no obvious depression or anxiety, speech and thought processing grossly intact, Cognitive function grossly intact  Flowsheet Row Office Visit from [facility]/30/2023 [facility]  PHQ-9 Total Score 0           03/30/2023    9:20 AM 01/15/2023   11:40 AM 06/05/2021   11:09 AM 05/01/2020   10:00 AM 09/21/2019   11:31 AM  Depression screen PHQ 2/9  Decreased Interest 0 0 0 0 1  Down,  Depressed, Hopeless 0 1 0 0 1  PHQ - 2 Score 0 1 0 0 2  Altered sleeping 0 0 0 0 1  Tired, decreased energy 0 0 0 0 1  Change in appetite 0 0 1 0 1  Feeling bad or failure about yourself  0 2 0 0 0  Trouble concentrating 0 1 1 0 0  Moving slowly or fidgety/restless 0 0 1 0 0  Suicidal thoughts 0 0 0 0 0  PHQ-9 Score 0 4 3 0 5  Difficult doing work/chores Not difficult at all Not difficult at all Not difficult at all Not difficult at all Not difficult at all       06/05/2021   11:10 AM 11/14/2021    6:59 AM 07/21/2022   11:37 AM 01/15/2023   11:22 AM 03/30/2023    9:19 AM  Fall Risk  Falls in the past year? 1  0 0 1  Was there an injury with Fall? 1  0  0  Fall Risk Category Calculator 2  0  1  Fall Risk Category (Retired) Moderate      (RETIRED) Patient Fall Risk Level Moderate fall risk Low fall risk     Patient at Risk for Falls Due to   No Fall Risks  No Fall Risks  Fall risk Follow up   Falls evaluation completed  Falls evaluation completed     SUMMARY AND PLAN:  Encounter for Medicare annual wellness exam  Estrogen deficiency - Plan: DG Bone Density   Discussed applicable health maintenance/preventive health measures  and advised and referred or ordered per patient preferences: -she wanted me to order the bone density test after discussion benefits/limitations, lifestyle changes for stron bones, she agrees to call if she does not hear from the schedulers about this test in the next 1-2 weks -discussed other measures due - she thinks she had her Tetanus booster at the office, but plans to review her records Health Maintenance  Topic Date Due   Hepatitis C Screening  Never done   DTaP/Tdap/Td (1 - Tdap) Never done   DEXA SCAN  03/14/2019   COVID-19 Vaccine (7 - 2023-24 season) 04/14/2023   Medicare Annual Wellness (AWV)  03/29/2024   MAMMOGRAM  07/14/2024   Colonoscopy  08/28/2026   Pneumonia Vaccine 74+ Years old  Completed   INFLUENZA VACCINE  Completed   Zoster  Vaccines- Shingrix  Completed   HPV VACCINES  Aged [employer] and counseling on the following was provided based on the above review of health and a plan/checklist for the patient, along with additional information discussed, was provided for the patient in the patient instructions :  -Reviewed patient's current diet. Advised and counseled on a whole foods based healthy diet. A summary of a healthy diet was provided in the Patient Instructions.  -reviewed patient's current physical activity level and discussed exercise guidelines for adults. Provided community resources and ideas for safe exercise at home to assist in meeting exercise guideline recommendations in a safe and healthy way - see patient instructions.  -Advise yearly dental visits at minimum and regular eye exams -advised to keep follow up with dermatology about the skin issues -advised will notify practice managers regarding her concerns about checkin today and about the referral process  Follow up: see patient instructions     Patient Instructions  I really enjoyed getting to talk with you today! I am available on Tuesdays and Thursdays for virtual visits if you have any questions or concerns, or if I can be of any further assistance.   I will let the practice manager know about your concerns regarding the checkin today and the referral process.   CHECKLIST FROM ANNUAL WELLNESS VISIT:  -Follow up (please call to schedule if not scheduled after visit):   -yearly for annual wellness visit with primary care office  Here is a list of your preventive care/health maintenance measures and the plan for each if any are due:  PLAN For any measures below that may be due:  -I sent the order for the bone density test, if the imaging office does not call you to schedule in 1-2 weeks, please call Dr. Hardie Shackleton office for assistance. If results are normal we will send you the results in my chart. If abnormal, we will request that you  see Dr. Ardyth Harps so that she can evaluate and determine the best treatment option.  -let us know if you you find the record for the tetanus booster  Health Maintenance  Topic Date Due   Hepatitis C Screening  Never done   DTaP/Tdap/Td (1 - Tdap) Never done   DEXA SCAN  03/14/2019   INFLUENZA VACCINE  01/07/2023   COVID-19 Vaccine (6 - 2023-24 season) 02/07/2023   Medicare Annual Wellness (AWV)  03/29/2024   MAMMOGRAM  07/14/2024   Colonoscopy  08/28/2026   Pneumonia Vaccine 59+ Years old  Completed   Zoster Vaccines- Shingrix  Completed   HPV VACCINES  Aged Out    -See a dentist at least yearly  -Get your eyes checked  and then per your eye specialist's recommendations  -Other issues addressed today:   -I have included below further information regarding a healthy whole foods based diet, physical activity guidelines for adults, stress management and opportunities for social connections. I hope you find this information useful.   -----------------------------------------------------------------------------------------------------------------------------------------------------------------------------------------------------------------------------------------------------------  NUTRITION: -eat real food: lots of colorful vegetables (half the plate) and fruits -5-7 servings of vegetables and fruits per day (fresh or steamed is best), exp. 2 servings of vegetables with lunch and dinner and 2 servings of fruit per day. Berries and greens such as kale and collards are great choices.  -consume on a regular basis: whole grains (make sure first ingredient on label contains the word "whole"), fresh fruits, fish, nuts, seeds, healthy oils (such as olive oil, avocado oil, grape seed oil) -may eat small amounts of dairy and lean meat on occasion, but avoid processed meats such as ham, bacon, lunch meat, etc. -drink water -try to avoid fast food and pre-packaged foods, processed meat -most  experts advise limiting sodium to < 2300mg  per day, should limit further is any chronic conditions such as high blood pressure, heart disease, diabetes, etc. The American Heart Association advised that < 1500mg  is is ideal -try to avoid foods that contain any ingredients with names you do not recognize  -try to avoid sugar/sweets (except for the natural sugar that occurs in fresh fruit) -try to avoid sweet drinks -try to avoid white rice, white bread, pasta (unless whole grain), white or yellow potatoes  EXERCISE GUIDELINES FOR ADULTS: -if you wish to increase your physical activity, do so gradually and with the approval of your doctor -STOP and seek medical care immediately if you have any chest pain, chest discomfort or trouble breathing when starting or increasing exercise  -move and stretch your body, legs, feet and arms when sitting for long periods -Physical activity guidelines for optimal health in adults: -least 150 minutes per week of aerobic exercise (can talk, but not sing) once approved by your doctor, 20-30 minutes of sustained activity or two 10 minute episodes of sustained activity every day.  -resistance training at least 2 days per week if approved by your doctor -balance exercises 3+ days per week:   Stand somewhere where you have something sturdy to hold onto if you lose balance.    1) lift up on toes, start with 5x per day and work up to 20x   2) stand and lift on leg straight out to the side so that foot is a few inches of the floor, start with 5x each side and work up to 20x each side   3) stand on one foot, start with 5 seconds each side and work up to 20 seconds on each side  If you need ideas or help with getting more active:  -Silver sneakers https://tools.silversneakers.com  -Walk with a Doc: http://www.duncan-williams.com/  -try to include resistance (weight lifting/strength building) and balance exercises twice per week: or the following link for  ideas: http://castillo-powell.com/  BuyDucts.dk  STRESS MANAGEMENT: -can try meditating, or just sitting quietly with deep breathing while intentionally relaxing all parts of your body for 5 minutes daily -if you need further help with stress, anxiety or depression please follow up with your primary doctor or contact the wonderful folks at [facility]: [phone]  SOCIAL CONNECTIONS: -options in [place] if you wish to engage in more social and exercise related activities:  -Silver sneakers https://tools.silversneakers.com  -Walk with a Doc: http://www.duncan-williams.com/  -Check out the [facility]  Active Adults 50+ section on the [place] [employer] (hiking clubs, book clubs, cards and games, chess, exercise classes, aquatic classes and much more) - see the website for details: https://www.[place].gov/departments/parks-recreation/active-adults50  -YouTube has lots of exercise videos for different ages and abilities as well  -[facility] (a variety of indoor and outdoor inperson activities for adults). [phone]. [address]  -Virtual Online Classes (a variety of topics): see seniorplanet.org or call 503-340-4137  -consider volunteering at a school, hospice center, church, senior center or elsewhere           Terressa Koyanagi, DO

## 2023-03-30 NOTE — Patient Instructions (Addendum)
I really enjoyed getting to talk with you today! I am available on Tuesdays and Thursdays for virtual visits if you have any questions or concerns, or if I can be of any further assistance.   I will let the practice manager know about your concerns regarding the checkin today and the referral process.   CHECKLIST FROM ANNUAL WELLNESS VISIT:  -Follow up (please call to schedule if not scheduled after visit):   -yearly for annual wellness visit with primary care office  Here is a list of your preventive care/health maintenance measures and the plan for each if any are due:  PLAN For any measures below that may be due:  -I sent the order for the bone density test, if the imaging office does not call you to schedule in 1-2 weeks, please call Dr. Hardie Shackleton office for assistance. If results are normal we will send you the results in my chart. If abnormal, we will request that you see Dr. Ardyth Harps so that she can evaluate and determine the best treatment option.  -let us know if you you find the record for the tetanus booster  Health Maintenance  Topic Date Due   Hepatitis C Screening  Never done   DTaP/Tdap/Td (1 - Tdap) Never done   DEXA SCAN  03/14/2019   INFLUENZA VACCINE  01/07/2023   COVID-19 Vaccine (6 - 2023-24 season) 02/07/2023   Medicare Annual Wellness (AWV)  03/29/2024   MAMMOGRAM  07/14/2024   Colonoscopy  08/28/2026   Pneumonia Vaccine 73+ Years old  Completed   Zoster Vaccines- Shingrix  Completed   HPV VACCINES  Aged Out    -See a dentist at least yearly  -Get your eyes checked and then per your eye specialist's recommendations  -Other issues addressed today:   -I have included below further information regarding a healthy whole foods based diet, physical activity guidelines for adults, stress management and opportunities for social connections. I hope you find this information useful.    -----------------------------------------------------------------------------------------------------------------------------------------------------------------------------------------------------------------------------------------------------------  NUTRITION: -eat real food: lots of colorful vegetables (half the plate) and fruits -5-7 servings of vegetables and fruits per day (fresh or steamed is best), exp. 2 servings of vegetables with lunch and dinner and 2 servings of fruit per day. Berries and greens such as kale and collards are great choices.  -consume on a regular basis: whole grains (make sure first ingredient on label contains the word "whole"), fresh fruits, fish, nuts, seeds, healthy oils (such as olive oil, avocado oil, grape seed oil) -may eat small amounts of dairy and lean meat on occasion, but avoid processed meats such as ham, bacon, lunch meat, etc. -drink water -try to avoid fast food and pre-packaged foods, processed meat -most experts advise limiting sodium to < 2300mg  per day, should limit further is any chronic conditions such as high blood pressure, heart disease, diabetes, etc. The American Heart Association advised that < 1500mg  is is ideal -try to avoid foods that contain any ingredients with names you do not recognize  -try to avoid sugar/sweets (except for the natural sugar that occurs in fresh fruit) -try to avoid sweet drinks -try to avoid white rice, white bread, pasta (unless whole grain), white or yellow potatoes  EXERCISE GUIDELINES FOR ADULTS: -if you wish to increase your physical activity, do so gradually and with the approval of your doctor -STOP and seek medical care immediately if you have any chest pain, chest discomfort or trouble breathing when starting or increasing exercise  -move and stretch your  body, legs, feet and arms when sitting for long periods -Physical activity guidelines for optimal health in adults: -least 150 minutes per week of  aerobic exercise (can talk, but not sing) once approved by your doctor, 20-30 minutes of sustained activity or two 10 minute episodes of sustained activity every day.  -resistance training at least 2 days per week if approved by your doctor -balance exercises 3+ days per week:   Stand somewhere where you have something sturdy to hold onto if you lose balance.    1) lift up on toes, start with 5x per day and work up to 20x   2) stand and lift on leg straight out to the side so that foot is a few inches of the floor, start with 5x each side and work up to 20x each side   3) stand on one foot, start with 5 seconds each side and work up to 20 seconds on each side  If you need ideas or help with getting more active:  -Silver sneakers https://tools.silversneakers.com  -Walk with a Doc: http://www.duncan-williams.com/  -try to include resistance (weight lifting/strength building) and balance exercises twice per week: or the following link for ideas: http://castillo-powell.com/  BuyDucts.dk  STRESS MANAGEMENT: -can try meditating, or just sitting quietly with deep breathing while intentionally relaxing all parts of your body for 5 minutes daily -if you need further help with stress, anxiety or depression please follow up with your primary doctor or contact the wonderful folks at [facility]: [phone]  SOCIAL CONNECTIONS: -options in [place] if you wish to engage in more social and exercise related activities:  -Silver sneakers https://tools.silversneakers.com  -Walk with a Doc: http://www.duncan-williams.com/  -Check out the [facility] Active Adults 50+ section on the [place] [employer] (hiking clubs, book clubs, cards and games, chess, exercise classes, aquatic classes and much more) - see the website for  details: https://www.[place].gov/departments/parks-recreation/active-adults50  -YouTube has lots of exercise videos for different ages and abilities as well  -[facility] (a variety of indoor and outdoor inperson activities for adults). [phone]. [address]  -Virtual Online Classes (a variety of topics): see seniorplanet.org or call [phone]  -consider volunteering at a school, hospice center, church, senior center or elsewhere

## 2023-04-07 ENCOUNTER — Ambulatory Visit (INDEPENDENT_AMBULATORY_CARE_PROVIDER_SITE_OTHER): Payer: Medicare HMO | Admitting: Internal Medicine

## 2023-04-07 ENCOUNTER — Encounter: Payer: Self-pay | Admitting: Internal Medicine

## 2023-04-07 VITALS — BP 120/84 | HR 77 | Temp 98.2°F | Ht 61.0 in | Wt 162.2 lb

## 2023-04-07 DIAGNOSIS — Z0000 Encounter for general adult medical examination without abnormal findings: Secondary | ICD-10-CM

## 2023-04-07 DIAGNOSIS — I10 Essential (primary) hypertension: Secondary | ICD-10-CM | POA: Diagnosis not present

## 2023-04-07 DIAGNOSIS — Z1159 Encounter for screening for other viral diseases: Secondary | ICD-10-CM

## 2023-04-07 NOTE — Progress Notes (Signed)
Established Patient Office Visit     CC/Reason for Visit: Annual preventive exam  HPI: Kimberly Conway is a 67 y.o. female who is coming in today for the above mentioned reasons. Past Medical History is significant for: GERD, depression and anxiety, history of right ICA aneurysm status post endovascular treatment.  Has been doing well other than dealing with some contact dermatitis/fungal infection of her hands.  She has been under the care of dermatology and has been taking Lamisil orally.  Has routine eye and dental care.  Immunizations are up-to-date with exception of Tdap.  All cancer screening is up-to-date.   Past Medical/Surgical History: Past Medical History:  Diagnosis Date   Anginal pain (HCC)    Depression    Family history of adverse reaction to anesthesia    sister gets nauseous after anesthesia   GAD (generalized anxiety disorder)    GERD (gastroesophageal reflux disease)    Hepatitis B 1965   HTN (hypertension)    Multiple thyroid nodules    Other atopic dermatitis 08/17/2020   Seasonal allergies    Urticaria    Vertigo     Past Surgical History:  Procedure Laterality Date   APPENDECTOMY     COLONOSCOPY  08/28/2019   DILATION AND CURETTAGE OF UTERUS     IR 3D INDEPENDENT WKST  04/24/2021   IR ANGIO EXTERNAL CAROTID SEL EXT CAROTID UNI R MOD SED  04/24/2021   IR ANGIO INTRA EXTRACRAN SEL COM CAROTID INNOMINATE BILAT MOD SED  11/14/2021   IR ANGIO INTRA EXTRACRAN SEL COM CAROTID INNOMINATE UNI R MOD SED  02/14/2021   IR ANGIO INTRA EXTRACRAN SEL INTERNAL CAROTID UNI L MOD SED  02/14/2021   IR ANGIO INTRA EXTRACRAN SEL INTERNAL CAROTID UNI R MOD SED  04/24/2021   IR ANGIO VERTEBRAL SEL SUBCLAVIAN INNOMINATE UNI R MOD SED  02/14/2021   IR ANGIO VERTEBRAL SEL VERTEBRAL UNI L MOD SED  02/14/2021   IR ANGIO VERTEBRAL SEL VERTEBRAL UNI L MOD SED  11/14/2021   IR CT HEAD LTD  04/24/2021   IR PTA INTRACRANIAL  04/24/2021   IR TRANSCATH/EMBOLIZ  04/24/2021   IR US  GUIDE VASC ACCESS RIGHT  02/14/2021   IR US GUIDE VASC ACCESS RIGHT  04/24/2021   IR US GUIDE VASC ACCESS RIGHT  11/14/2021   RADIOLOGY WITH ANESTHESIA N/A 04/24/2021   Procedure: EMBOLIZATION;  Surgeon: Baldemar Lenis, MD;  [facility];  Service: Radiology;  Laterality: N/A;   UPPER GI ENDOSCOPY  08/28/2019    Social History:  reports that she quit smoking about 8 years ago. Her smoking use included cigarettes. She has never used smokeless tobacco. She reports that she does not currently use alcohol. She reports that she does not use drugs.  Allergies: No Known Allergies  Family History:  Family History  Problem Relation Age of Onset   Diabetes Mother    Breast cancer Mother    CAD Father    Hypertension Sister    Breast cancer Sister    Migraines Sister    Migraines Sister    Diabetes Brother    Colon cancer Neg Hx    Esophageal cancer Neg Hx    Pancreatic cancer Neg Hx    Stomach cancer Neg Hx      Current Outpatient Medications:    aspirin EC 81 MG tablet, Take 81 mg by mouth in the morning. Swallow whole., Disp: , Rfl:    b complex vitamins tablet,  Take 1 tablet by mouth in the morning., Disp: , Rfl:    Calcium Citrate (CITRACAL PO), Take 3 tablets by mouth daily after lunch., Disp: , Rfl:    cetirizine (ZYRTEC) 10 MG tablet, TAKE 1 TABLET EVERY DAY, Disp: 90 tablet, Rfl: 1   desvenlafaxine (PRISTIQ) 50 MG 24 hr tablet, Take 1 tablet (50 mg total) by mouth daily., Disp: 90 tablet, Rfl: 3   fluticasone (FLONASE) 50 MCG/ACT nasal spray, USE 2 SPRAYS IN EACH NOSTRIL EVERY DAY, Disp: 48 g, Rfl: 3   levocetirizine (XYZAL) 5 MG tablet, TAKE 1 TABLET (5 MG TOTAL) BY MOUTH EVERY EVENING., Disp: 90 tablet, Rfl: 1   LORazepam (ATIVAN) 0.5 MG tablet, Take 1 tablet (0.5 mg total) by mouth daily as needed., Disp: 30 tablet, Rfl: 2   Misc Natural Products (GLUCOSAMINE CHONDROITIN TRIPLE) TABS, Take 1 tablet by mouth daily., Disp: , Rfl:    Multiple Vitamin  (MULTIVITAMIN) tablet, Take 1 tablet by mouth daily after lunch., Disp: , Rfl:    omeprazole (PRILOSEC) 20 MG capsule, TAKE 1 CAPSULE TWICE DAILY BEFORE MEALS, Disp: 180 capsule, Rfl: 1   Polyethyl Glycol-Propyl Glycol (LUBRICANT EYE DROPS) 0.4-0.3 % SOLN, Place 1-2 drops into both eyes 3 (three) times daily as needed (dry/irritated eyes)., Disp: , Rfl:    Probiotic Product (PROBIOTIC PO), Take 1 capsule by mouth in the morning., Disp: , Rfl:    sodium chloride (OCEAN) 0.65 % SOLN nasal spray, Place 1 spray into both nostrils 2 (two) times daily., Disp: , Rfl:    terbinafine (LAMISIL) 250 MG tablet, Take 250 mg by mouth daily., Disp: , Rfl:    Turmeric 500 MG CAPS, Take 500 mg by mouth daily after lunch., Disp: , Rfl:   Review of Systems:  Negative unless indicated in HPI.   Physical Exam: Vitals:   04/07/23 1257  BP: 120/84  Pulse: 77  Temp: 98.2 F (36.8 C)  TempSrc: Oral  SpO2: 97%  Weight: 162 lb 3.2 oz (73.6 kg)  Height: 5\' 1"  (1.549 m)    Body mass index is 30.65 kg/m.   Physical Exam Vitals reviewed.  Constitutional:      General: She is not in acute distress.    Appearance: Normal appearance. She is not ill-appearing, toxic-appearing or diaphoretic.  HENT:     Head: Normocephalic.     Right Ear: Tympanic membrane, ear canal and external ear normal. There is no impacted cerumen.     Left Ear: Tympanic membrane, ear canal and external ear normal. There is no impacted cerumen.     Nose: Nose normal.     Mouth/Throat:     Mouth: Mucous membranes are moist.     Pharynx: Oropharynx is clear. No oropharyngeal exudate or posterior oropharyngeal erythema.  Eyes:     General: No scleral icterus.       Right eye: No discharge.        Left eye: No discharge.     Conjunctiva/sclera: Conjunctivae normal.     Pupils: Pupils are equal, round, and reactive to light.  Neck:     Vascular: No carotid bruit.  Cardiovascular:     Rate and Rhythm: Normal rate and regular rhythm.      Pulses: Normal pulses.     Heart sounds: Normal heart sounds.  Pulmonary:     Effort: Pulmonary effort is normal. No respiratory distress.     Breath sounds: Normal breath sounds.  Abdominal:     General: Abdomen is flat. Bowel  sounds are normal.     Palpations: Abdomen is soft.  Musculoskeletal:        General: Normal range of motion.     Cervical back: Normal range of motion.  Skin:    General: Skin is warm and dry.  Neurological:     General: No focal deficit present.     Mental Status: She is alert and oriented to person, place, and time. Mental status is at baseline.  Psychiatric:        Mood and Affect: Mood normal.        Behavior: Behavior normal.        Thought Content: Thought content normal.        Judgment: Judgment normal.      Impression and Plan:  Encounter for preventive health examination  Primary hypertension -     CBC with Differential/Platelet; Future -     Lipid panel; Future -     Comprehensive metabolic panel; Future -     TSH; Future -     Vitamin B12; Future -     VITAMIN D 25 Hydroxy (Vit-D Deficiency, Fractures); Future  Encounter for hepatitis C screening test for low risk patient -     Hepatitis C antibody; Future   -Recommend routine eye and dental care. -Healthy lifestyle discussed in detail. -Labs to be updated today. -Prostate cancer screening: N/A Health Maintenance  Topic Date Due   Hepatitis C Screening  Never done   DTaP/Tdap/Td vaccine (1 - Tdap) Never done   DEXA scan (bone density measurement)  03/14/2019   COVID-19 Vaccine (7 - 2023-24 season) 04/14/2023   Medicare Annual Wellness Visit  03/29/2024   Mammogram  07/14/2024   Colon Cancer Screening  08/28/2026   Pneumonia Vaccine  Completed   Flu Shot  Completed   Zoster (Shingles) Vaccine  Completed   HPV Vaccine  Aged Out      -Will update Tdap at pharmacy.    Chaya Jan, MD [facility]

## 2023-04-08 ENCOUNTER — Other Ambulatory Visit (INDEPENDENT_AMBULATORY_CARE_PROVIDER_SITE_OTHER): Payer: Medicare HMO

## 2023-04-08 DIAGNOSIS — I10 Essential (primary) hypertension: Secondary | ICD-10-CM

## 2023-04-08 DIAGNOSIS — Z1159 Encounter for screening for other viral diseases: Secondary | ICD-10-CM | POA: Diagnosis not present

## 2023-04-08 LAB — CBC WITH DIFFERENTIAL/PLATELET
Basophils Absolute: 0.1 10*3/uL (ref 0.0–0.1)
Basophils Relative: 1.2 % (ref 0.0–3.0)
Eosinophils Absolute: 0.2 10*3/uL (ref 0.0–0.7)
Eosinophils Relative: 4 % (ref 0.0–5.0)
HCT: 42.6 % (ref 36.0–46.0)
Hemoglobin: 14.1 g/dL (ref 12.0–15.0)
Lymphocytes Relative: 26.2 % (ref 12.0–46.0)
Lymphs Abs: 1.3 10*3/uL (ref 0.7–4.0)
MCHC: 33.2 g/dL (ref 30.0–36.0)
MCV: 93.8 fL (ref 78.0–100.0)
Monocytes Absolute: 0.4 10*3/uL (ref 0.1–1.0)
Monocytes Relative: 7.7 % (ref 3.0–12.0)
Neutro Abs: 3 10*3/uL (ref 1.4–7.7)
Neutrophils Relative %: 60.9 % (ref 43.0–77.0)
Platelets: 300 10*3/uL (ref 150.0–400.0)
RBC: 4.54 Mil/uL (ref 3.87–5.11)
RDW: 13 % (ref 11.5–15.5)
WBC: 4.9 10*3/uL (ref 4.0–10.5)

## 2023-04-08 LAB — COMPREHENSIVE METABOLIC PANEL
ALT: 24 U/L (ref 0–35)
AST: 22 U/L (ref 0–37)
Albumin: 4.2 g/dL (ref 3.5–5.2)
Alkaline Phosphatase: 121 U/L — ABNORMAL HIGH (ref 39–117)
BUN: 20 mg/dL (ref 6–23)
CO2: 27 meq/L (ref 19–32)
Calcium: 9.5 mg/dL (ref 8.4–10.5)
Chloride: 103 meq/L (ref 96–112)
Creatinine, Ser: 0.81 mg/dL (ref 0.40–1.20)
GFR: 74.25 mL/min (ref >60.00)
Glucose, Bld: 99 mg/dL (ref 70–99)
Potassium: 3.8 meq/L (ref 3.5–5.1)
Sodium: 139 meq/L (ref 135–145)
Total Bilirubin: 0.5 mg/dL (ref 0.2–1.2)
Total Protein: 7.2 g/dL (ref 6.0–8.3)

## 2023-04-08 LAB — LIPID PANEL
Cholesterol: 174 mg/dL (ref 0–200)
HDL: 60.9 mg/dL (ref >39.00)
LDL Cholesterol: 96 mg/dL (ref 0–99)
NonHDL: 113.51
Total CHOL/HDL Ratio: 3
Triglycerides: 90 mg/dL (ref 0.0–149.0)
VLDL: 18 mg/dL (ref 0.0–40.0)

## 2023-04-08 LAB — TSH: TSH: 2.94 u[IU]/mL (ref 0.35–5.50)

## 2023-04-08 LAB — VITAMIN D 25 HYDROXY (VIT D DEFICIENCY, FRACTURES): VITD: 34.87 ng/mL (ref 30.00–100.00)

## 2023-04-08 LAB — VITAMIN B12: Vitamin B-12: 1035 pg/mL — ABNORMAL HIGH (ref 211–911)

## 2023-04-09 LAB — HEPATITIS C ANTIBODY: Hepatitis C Ab: NONREACTIVE

## 2023-04-13 ENCOUNTER — Other Ambulatory Visit (HOSPITAL_COMMUNITY): Payer: Self-pay | Admitting: Neuroradiology

## 2023-04-13 ENCOUNTER — Telehealth (HOSPITAL_COMMUNITY): Payer: Self-pay

## 2023-04-13 DIAGNOSIS — I671 Cerebral aneurysm, nonruptured: Secondary | ICD-10-CM

## 2023-04-13 NOTE — Telephone Encounter (Signed)
Called to schedule mra, no answer, left vm. AB

## 2023-04-26 ENCOUNTER — Ambulatory Visit (HOSPITAL_COMMUNITY)
Admission: RE | Admit: 2023-04-26 | Discharge: 2023-04-26 | Disposition: A | Discharge status: Home / Self Care | Payer: Medicare HMO | Source: Ambulatory Visit | Attending: Neuroradiology | Admitting: Neuroradiology

## 2023-04-26 DIAGNOSIS — I671 Cerebral aneurysm, nonruptured: Secondary | ICD-10-CM | POA: Insufficient documentation

## 2023-05-01 ENCOUNTER — Encounter: Payer: Self-pay | Admitting: Dermatology

## 2023-05-01 ENCOUNTER — Encounter: Payer: Self-pay | Admitting: Internal Medicine

## 2023-05-12 ENCOUNTER — Other Ambulatory Visit: Payer: Self-pay | Admitting: Internal Medicine

## 2023-05-12 DIAGNOSIS — J309 Allergic rhinitis, unspecified: Secondary | ICD-10-CM

## 2023-05-23 ENCOUNTER — Other Ambulatory Visit: Payer: Self-pay | Admitting: Internal Medicine

## 2023-06-11 ENCOUNTER — Encounter (HOSPITAL_COMMUNITY): Payer: Self-pay

## 2023-06-27 ENCOUNTER — Other Ambulatory Visit: Payer: Self-pay | Admitting: Adult Health

## 2023-06-27 DIAGNOSIS — F411 Generalized anxiety disorder: Secondary | ICD-10-CM

## 2023-06-27 DIAGNOSIS — F331 Major depressive disorder, recurrent, moderate: Secondary | ICD-10-CM

## 2023-06-28 NOTE — Telephone Encounter (Signed)
Please schedule pt an appt lv 7/19 due back in 6 months.

## 2023-07-01 ENCOUNTER — Other Ambulatory Visit (HOSPITAL_COMMUNITY): Payer: Self-pay | Admitting: Neuroradiology

## 2023-07-01 ENCOUNTER — Other Ambulatory Visit: Payer: Self-pay

## 2023-07-01 ENCOUNTER — Telehealth (HOSPITAL_COMMUNITY): Payer: Self-pay

## 2023-07-01 DIAGNOSIS — R42 Dizziness and giddiness: Secondary | ICD-10-CM

## 2023-07-01 DIAGNOSIS — I671 Cerebral aneurysm, nonruptured: Secondary | ICD-10-CM

## 2023-07-01 NOTE — Telephone Encounter (Signed)
Called to schedule consult, no answer, left vm. AB

## 2023-07-01 NOTE — Telephone Encounter (Signed)
Pt is scheduled  07/08/23

## 2023-07-01 NOTE — Telephone Encounter (Signed)
LF 08/13 LV 07/19 NV 1/30

## 2023-07-02 ENCOUNTER — Encounter: Payer: Self-pay | Admitting: Internal Medicine

## 2023-07-06 ENCOUNTER — Other Ambulatory Visit: Payer: Self-pay

## 2023-07-06 ENCOUNTER — Other Ambulatory Visit: Payer: Self-pay | Admitting: Internal Medicine

## 2023-07-06 ENCOUNTER — Other Ambulatory Visit (HOSPITAL_COMMUNITY): Payer: Self-pay

## 2023-07-06 DIAGNOSIS — J302 Other seasonal allergic rhinitis: Secondary | ICD-10-CM

## 2023-07-06 MED ORDER — CETIRIZINE HCL 10 MG PO TABS
10.0000 mg | ORAL_TABLET | Freq: Every day | ORAL | 1 refills | Status: DC
  Filled 2023-07-06: qty 90, 90d supply, fill #0

## 2023-07-06 MED ORDER — LEVOCETIRIZINE DIHYDROCHLORIDE 5 MG PO TABS
5.0000 mg | ORAL_TABLET | Freq: Every evening | ORAL | 1 refills | Status: AC
  Filled 2023-07-06: qty 90, 90d supply, fill #0
  Filled 2024-02-10: qty 90, 90d supply, fill #1

## 2023-07-06 NOTE — Telephone Encounter (Signed)
Copied from CRM [id number]. Topic: Clinical - Medication Refill >> Jul 06, 2023 11:32 AM Isabell A wrote: Most Recent Primary Care Visit:  Provider: [facility]  Department: [facility]  Visit Type: LAB  Date: 04/08/2023  Medication: cetirizine (ZYRTEC) 10 MG tablet levocetirizine (XYZAL) 5 MG tablet   Has the patient contacted their pharmacy? Yes (Agent: If no, request that the patient contact the pharmacy for the refill. If patient does not wish to contact the pharmacy document the reason why and proceed with request.) (Agent: If yes, when and what did the pharmacy advise?)  Is this the correct pharmacy for this prescription? Yes If no, delete pharmacy and type the correct one.  This is the patient's preferred pharmacy:  [facility] [address] [place], [place], [place] [address]   Has the prescription been filled recently? Yes  Is the patient out of the medication? No  Has the patient been seen for an appointment in the last year OR does the patient have an upcoming appointment? Yes  Can we respond through MyChart? No  Agent: Please be advised that Rx refills may take up to 3 business days. We ask that you follow-up with your pharmacy.

## 2023-07-08 ENCOUNTER — Encounter: Payer: Self-pay | Admitting: Adult Health

## 2023-07-08 ENCOUNTER — Ambulatory Visit (INDEPENDENT_AMBULATORY_CARE_PROVIDER_SITE_OTHER): Payer: PPO | Admitting: Adult Health

## 2023-07-08 DIAGNOSIS — F411 Generalized anxiety disorder: Secondary | ICD-10-CM

## 2023-07-08 DIAGNOSIS — F331 Major depressive disorder, recurrent, moderate: Secondary | ICD-10-CM

## 2023-07-08 MED ORDER — DESVENLAFAXINE SUCCINATE ER 50 MG PO TB24: 50.0000 mg | ORAL_TABLET | Freq: Every day | ORAL | 5 refills | Status: DC

## 2023-07-08 MED ORDER — LORAZEPAM 0.5 MG PO TABS: 0.5000 mg | ORAL_TABLET | Freq: Every day | ORAL | 2 refills | Status: DC | PRN

## 2023-07-08 NOTE — Progress Notes (Signed)
Kimberly Conway [id number] 1955-06-09 68 y.o.  Subjective:   Patient ID:  Kimberly Conway is a 68 y.o. (DOB [birth year]) female.  Chief Complaint: No chief complaint on file.   HPI Kimberly Conway presents to the office today for follow-up of MDD and GAD.  Describes mood today as "not too good". Pleasant. Reports tearfulness. Mood symptoms - some depression, anxiety and irritability. Stable interest and motivation. Denies panic attacks. Reports worry, rumination, and over thinking. Reports some issues with current living situation. Mood is variable. Stating "I feel like I'm doing ok". Feels like current medications are helpful.  Taking medications as prescribed. Energy levels stable. Active, does not have a regular exercise routine.  Enjoys some usual interests and activities. Single. Lives alone with 2 cats. No children. Has a sister local. Other family lives in [place], [place].  Appetite adequate. Weight stable - 155 pounds, 61". Sleeps well most nights - "thinking more at night". Averages 6 to 7 hours. Focus and concentration stable. Completing tasks. Managing aspects of household. Currently unemployed - looking for work. Denies SI or HI.  Denies AH or VH.  Denies self harm. Denies substance use.  Previous medication trials: Ativan, Pristiq   GAD-7    Flowsheet Row Office Visit from 04/07/2023 in [facility] [place] Office Visit from 03/30/2023 in [facility] [place]  Total GAD-7 Score 0 0      PHQ2-9    Flowsheet Row Office Visit from 04/07/2023 in [facility] [place] Office Visit from 03[facility]30/2023 in [facility] [place] Office Visit from 01/15/2023 in [facility] [place] Office Visit from 06/05/2021 in [facility] [place] Office Visit from 05/01/2020 in [facility] [place]  PHQ-2 Total Score 0 0 1 0 0  PHQ-9 Total Score  0 0 4 3 0      Flowsheet Row Admission (Discharged) from 04/24/2021 in [facility] NEURO/TRAUMA/SURGICAL ICU IR MCIR2 from 02/14/2021 in [facility] INTERVENTIONAL RADIOLOGY ED from 01/23/2021 in [facility]  C-SSRS RISK CATEGORY No Risk No Risk No Risk        Review of Systems:  Review of Systems  Musculoskeletal:  Negative for gait problem.  Neurological:  Negative for tremors.  Psychiatric/Behavioral:         Please refer to HPI    Medications: I have reviewed the patient's current medications.  Current Outpatient Medications  Medication Sig Dispense Refill   aspirin EC 81 MG tablet Take 81 mg by mouth in the morning. Swallow whole.     b complex vitamins tablet Take 1 tablet by mouth in the morning.     Calcium Citrate (CITRACAL PO) Take 3 tablets by mouth daily after lunch.     cetirizine (ZYRTEC) 10 MG tablet Take 1 tablet (10 mg total) by mouth daily. 90 tablet 1   desvenlafaxine (PRISTIQ) 50 MG 24 hr tablet Take 1 tablet by mouth once daily 30 tablet 0   fluticasone (FLONASE) 50 MCG/ACT nasal spray USE 2 SPRAYS IN EACH NOSTRIL EVERY DAY 48 g 3   levocetirizine (XYZAL) 5 MG tablet Take 1 tablet (5 mg total) by mouth every evening. 90 tablet 1   LORazepam (ATIVAN) 0.5 MG tablet TAKE 1 TABLET BY MOUTH ONCE DAILY AS NEEDED 30 tablet 0   Misc Natural Products (GLUCOSAMINE CHONDROITIN TRIPLE) TABS Take 1 tablet by mouth  daily.     Multiple Vitamin (MULTIVITAMIN) tablet Take 1 tablet by mouth daily after lunch.     omeprazole (PRILOSEC) 20 MG capsule TAKE 1 CAPSULE TWICE DAILY BEFORE MEALS 180 capsule 1   Polyethyl Glycol-Propyl Glycol (LUBRICANT EYE DROPS) 0.4-0.3 % SOLN Place 1-2 drops into both eyes 3 (three) times daily as needed (dry/irritated eyes).     Probiotic Product (PROBIOTIC PO) Take 1 capsule by mouth in the morning.     sodium chloride (OCEAN) 0.65 % SOLN nasal spray Place 1 spray into both nostrils 2  (two) times daily.     terbinafine (LAMISIL) 250 MG tablet Take 250 mg by mouth daily.     Turmeric 500 MG CAPS Take 500 mg by mouth daily after lunch.     No current facility-administered medications for this visit.    Medication Side Effects: None  Allergies: No Known Allergies  Past Medical History:  Diagnosis Date   Anginal pain (HCC)    Depression    Family history of adverse reaction to anesthesia    sister gets nauseous after anesthesia   GAD (generalized anxiety disorder)    GERD (gastroesophageal reflux disease)    Hepatitis B 1965   HTN (hypertension)    Multiple thyroid nodules    Other atopic dermatitis 08/17/2020   Seasonal allergies    Urticaria    Vertigo     Past Medical History, Surgical history, Social history, and Family history were reviewed and updated as appropriate.   Please see review of systems for further details on the patient's review from today.   Objective:   Physical Exam:  There were no vitals taken for this visit.  Physical Exam Constitutional:      General: She is not in acute distress. Musculoskeletal:        General: No deformity.  Neurological:     Mental Status: She is alert and oriented to person, place, and time.     Coordination: Coordination normal.  Psychiatric:        Attention and Perception: Attention and perception normal. She does not perceive auditory or visual hallucinations.        Mood and Affect: Mood normal. Mood is not anxious or depressed. Affect is not labile, blunt, angry or inappropriate.        Speech: Speech normal.        Behavior: Behavior normal.        Thought Content: Thought content normal. Thought content is not paranoid or delusional. Thought content does not include homicidal or suicidal ideation. Thought content does not include homicidal or suicidal plan.        Cognition and Memory: Cognition and memory normal.        Judgment: Judgment normal.     Comments: Insight intact     Lab Review:      Component Value Date/Time   NA 139 04/08/2023 0759   K 3.8 04/08/2023 0759   CL 103 04/08/2023 0759   CO2 27 04/08/2023 0759   GLUCOSE 99 04/08/2023 0759   BUN 20 04/08/2023 0759   CREATININE 0.81 04/08/2023 0759   CALCIUM 9.5 04/08/2023 0759   PROT 7.2 04/08/2023 0759   PROT 7.5 05/01/2020 0000   ALBUMIN 4.2 04/08/2023 0759   AST 22 04/08/2023 0759   ALT 24 04/08/2023 0759   ALKPHOS 121 (H) 04/08/2023 0759   BILITOT 0.5 04/08/2023 0759   GFRNONAA >60 11/14/2021 5784       Component Value Date/Time  WBC 4.9 04/08/2023 0759   RBC 4.54 04/08/2023 0759   HGB 14.1 04/08/2023 0759   HCT 42.6 04/08/2023 0759   PLT 300.0 04/08/2023 0759   MCV 93.8 04/08/2023 0759   MCH 30.9 11/14/2021 0653   MCHC 33.2 04/08/2023 0759   RDW 13.0 04/08/2023 0759   LYMPHSABS 1.3 04/08/2023 0759   MONOABS 0.4 04/08/2023 0759   EOSABS 0.2 04/08/2023 0759   BASOSABS 0.1 04/08/2023 0759    No results found for: "POCLITH", "LITHIUM"   No results found for: "PHENYTOIN", "PHENOBARB", "VALPROATE", "CBMZ"   .res Assessment: Plan:    Plan:  PDMP reviewed  1. Pristiq 50mg  daily 2. Ativan 0.5mg  daily as needed - will call in 3 months for next set of refills.   RTC 6 months  Patient advised to contact office with any questions, adverse effects, or acute worsening in signs and symptoms.  20 minutes spent dedicated to the care of this patient on the date of this encounter to include pre-visit review of records, ordering of medication, post visit documentation, and face-to-face time with the patient discussing depression and anxiety. Discussed continuing current medication regimen.   Discussed potential benefits, risk, and side effects of benzodiazepines to include potential risk of tolerance and dependence, as well as possible drowsiness. Advised patient not to drive if experiencing drowsiness and to take lowest possible effective dose to minimize risk of dependence and tolerance.  There are  no diagnoses linked to this encounter.   Please see After Visit Summary for patient specific instructions.  Future Appointments  Date Time Provider Department Center  07/08/2023 11:30 AM Tucker Steedley, Thereasa Solo, NP [facility] None  07/09/2023  2:00 PM [facility] 3 [facility]    No orders of the defined types were placed in this encounter.   -------------------------------

## 2023-07-09 ENCOUNTER — Ambulatory Visit (HOSPITAL_COMMUNITY)
Admission: RE | Admit: 2023-07-09 | Discharge: 2023-07-09 | Disposition: A | Discharge status: Home / Self Care | Payer: Self-pay | Source: Ambulatory Visit | Attending: Neuroradiology | Admitting: Neuroradiology

## 2023-07-09 ENCOUNTER — Other Ambulatory Visit (HOSPITAL_COMMUNITY): Payer: Self-pay

## 2023-07-09 DIAGNOSIS — I671 Cerebral aneurysm, nonruptured: Secondary | ICD-10-CM

## 2023-07-09 DIAGNOSIS — R42 Dizziness and giddiness: Secondary | ICD-10-CM

## 2023-07-09 NOTE — Progress Notes (Signed)
Referring Physician(s): de Macedo Rodrigues,Rowen Hur  Chief Complaint: The patient is seen in follow up today s/p right ICA cavernous segment aneurysm embolization.  History of present illness:  Kimberly Conway is a 68 year old female with a past medical history significant for depression and vertigo who presented to the emergency department on 01/23/2021 due to a 5 day history of worsening dizziness, imbalance and fogginess. She underwent an MRI/MRA of the head due to concerns for posterior circulation stroke. The study was negative for stroke, however, MR angiogram revealed a 6 mm cavernous right ICA aneurysm which was confirmed on cerebral angiogram performed on February 14, 2021.  She has a history of smoking (40-50 pack/year) having quit approximately 9 years ago. Findings of the angiogram were discussed with [name] who elected to have her aneurysm treated endovascularly.  She was then started on dual anti-platelet therapy with aspirin and Brilinta and she had her aneurysm successfully treated endovascularly on 04/24/2021 with the use of flow diverter across the neck of the aneurysm.    Currently, she has no new neurological complaints.  She refers chronic right-sided headache and discomfort right arm which she says has been present for over 5 years.  She comes today to discuss results of recent MR angiogram of the brain.    Past Medical History:  Diagnosis Date   Anginal pain (HCC)    Depression    Family history of adverse reaction to anesthesia    sister gets nauseous after anesthesia   GAD (generalized anxiety disorder)    GERD (gastroesophageal reflux disease)    Hepatitis B 1965   HTN (hypertension)    Multiple thyroid nodules    Other atopic dermatitis 08/17/2020   Seasonal allergies    Urticaria    Vertigo     Past Surgical History:  Procedure Laterality Date   APPENDECTOMY     COLONOSCOPY  08/28/2019   DILATION AND CURETTAGE OF UTERUS     IR 3D INDEPENDENT WKST   04/24/2021   IR ANGIO EXTERNAL CAROTID SEL EXT CAROTID UNI R MOD SED  04/24/2021   IR ANGIO INTRA EXTRACRAN SEL COM CAROTID INNOMINATE BILAT MOD SED  11/14/2021   IR ANGIO INTRA EXTRACRAN SEL COM CAROTID INNOMINATE UNI R MOD SED  02/14/2021   IR ANGIO INTRA EXTRACRAN SEL INTERNAL CAROTID UNI L MOD SED  02/14/2021   IR ANGIO INTRA EXTRACRAN SEL INTERNAL CAROTID UNI R MOD SED  04/24/2021   IR ANGIO VERTEBRAL SEL SUBCLAVIAN INNOMINATE UNI R MOD SED  02/14/2021   IR ANGIO VERTEBRAL SEL VERTEBRAL UNI L MOD SED  02/14/2021   IR ANGIO VERTEBRAL SEL VERTEBRAL UNI L MOD SED  11/14/2021   IR CT HEAD LTD  04/24/2021   IR PTA INTRACRANIAL  04/24/2021   IR TRANSCATH/EMBOLIZ  04/24/2021   IR US GUIDE VASC ACCESS RIGHT  02/14/2021   IR US GUIDE VASC ACCESS RIGHT  04/24/2021   IR US GUIDE VASC ACCESS RIGHT  11/14/2021   RADIOLOGY WITH ANESTHESIA N/A 04/24/2021   Procedure: EMBOLIZATION;  Surgeon: Baldemar Lenis, MD;  Location: [facility] OR;  Service: Radiology;  Laterality: N/A;   UPPER GI ENDOSCOPY  08/28/2019    Allergies: Patient has no known allergies.  Medications: Prior to Admission medications   Medication Sig Start Date End Date Taking? Authorizing Provider  aspirin EC 81 MG tablet Take 81 mg by mouth in the morning. Swallow whole.    [provider]  b complex vitamins tablet Take 1  tablet by mouth in the morning.    [provider]  Calcium Citrate (CITRACAL PO) Take 3 tablets by mouth daily after lunch.    [provider]  cetirizine (ZYRTEC) 10 MG tablet Take 1 tablet (10 mg total) by mouth daily. 07/06/23   Philip Aspen, Limmie Patricia, MD  desvenlafaxine (PRISTIQ) 50 MG 24 hr tablet Take 1 tablet (50 mg total) by mouth daily. 07/08/23   Mozingo, Thereasa Solo, NP  fluticasone [facility]) 50 MCG/ACT nasal spray USE 2 SPRAYS IN [facility] NOSTRIL EVERY DAY 05/12/23   Philip Aspen, Limmie Patricia, MD  levocetirizine (XYZAL) 5 MG tablet Take 1 tablet (5 mg total) by mouth  every evening. 07/06/23   Philip Aspen, Limmie Patricia, MD  LORazepam (ATIVAN) 0.5 MG tablet Take 1 tablet (0.5 mg total) by mouth daily as needed. 07/08/23   Mozingo, Thereasa Solo, NP  Misc Natural Products (GLUCOSAMINE CHONDROITIN TRIPLE) TABS Take 1 tablet by mouth daily.    [provider]  Multiple Vitamin (MULTIVITAMIN) tablet Take 1 tablet by mouth daily after lunch.    [provider]  omeprazole (PRILOSEC) 20 MG capsule TAKE 1 CAPSULE TWICE DAILY BEFORE MEALS 05/24/23   Philip Aspen, Limmie Patricia, MD  Polyethyl Glycol-Propyl Glycol (LUBRICANT EYE DROPS) 0.4-0.3 % SOLN Place 1-2 drops into both eyes 3 (three) times daily as needed (dry/irritated eyes).    [provider]  Probiotic Product (PROBIOTIC PO) Take 1 capsule by mouth in the morning.    [provider]  sodium chloride (OCEAN) 0.65 % SOLN nasal spray Place 1 spray into both nostrils 2 (two) times daily.    [provider]  terbinafine (LAMISIL) 250 MG tablet Take 250 mg by mouth daily.    [provider]  Turmeric 500 MG CAPS Take 500 mg by mouth daily after lunch.    [provider]     Family History  Problem Relation Age of Onset   Diabetes Mother    Breast cancer Mother    CAD Father    Hypertension Sister    Breast cancer Sister    Migraines Sister    Migraines Sister    Diabetes Brother    Colon cancer Neg Hx    Esophageal cancer Neg Hx    Pancreatic cancer Neg Hx    Stomach cancer Neg Hx     Social History   Socioeconomic History   Marital status: Single    Spouse name: Not on file   Number of children: Not on file   Years of education: Not on file   Highest education level: Associate degree: academic program  Occupational History   Not on file  Tobacco Use   Smoking status: Former    Current packs/day: 0.00    Types: Cigarettes    Quit date: 06/21/2014    Years since quitting: 9.0   Smokeless tobacco: Never  Vaping Use   Vaping status:  Never Used  Substance and Sexual Activity   Alcohol use: Not Currently    Comment: Quit in 2016   Drug use: Never   Sexual activity: Not Currently    Birth control/protection: Post-menopausal  Other Topics Concern   Not on file  Social History Narrative   Right handed   Social Drivers of Health   Financial Resource Strain: Patient Declined (01/15/2023)   Overall Financial Resource Strain (CARDIA)    Difficulty of Paying Living Expenses: Patient declined  Food Insecurity: Patient Declined (01/15/2023)   Hunger Vital Sign  Worried About Programme researcher, broadcasting/film/video in the Last Year: Patient declined    Barista in the Last Year: Patient declined  Transportation Needs: No Transportation Needs (01/15/2023)   PRAPARE - Administrator, Civil Service (Medical): No    Lack of Transportation (Non-Medical): No  Physical Activity: Sufficiently Active (01/15/2023)   Exercise Vital Sign    Days of Exercise per Week: 5 days    Minutes of Exercise per Session: 30 min  Stress: No Stress Concern Present (01/15/2023)   [employer] of Occupational Health - Occupational Stress Questionnaire    Feeling of Stress : Only a little  Social Connections: Unknown (01/15/2023)   Social Connection and Isolation Panel [NHANES]    Frequency of Communication with Friends and Family: Not on file    Frequency of Social Gatherings with Friends and Family: Once a week    Attends Religious Services: Not on Marketing executive or Organizations: Yes    Attends Banker Meetings: Not on file    Marital Status: Divorced     Vital Signs: There were no vitals taken for this visit.  Physical Exam HENT:     Head: Normocephalic and atraumatic.     Mouth/Throat:     Mouth: Mucous membranes are moist.     Pharynx: Oropharynx is clear.  Eyes:     Extraocular Movements: Extraocular movements intact.     Pupils: Pupils are equal, round, and reactive to light.  Pulmonary:     Effort:  Pulmonary effort is normal.  Neurological:     Mental Status: She is alert and oriented to person, place, and time.     Cranial Nerves: Cranial nerves 2-12 are intact.     Sensory: Sensation is intact.     Coordination: Coordination is intact.     Gait: Gait is intact.     Imaging: EXAM: MRA HEAD WITHOUT CONTRAST   TECHNIQUE: Angiographic images of the Circle of Willis were acquired using MRA technique without intravenous contrast.   COMPARISON:  Multiple prior angiographic studies.  MRI 01/23/2021.   FINDINGS: Anterior circulation: Left carotid system is normal. Signal loss in the right carotid siphon region related to the flow diverting stent. I do not see evidence of residual flow within the previously shown aneurysm. Circle of Willis anterior circulation vessels are normal.   Posterior circulation: No flow seen in a tiny right vertebral artery. Left vertebral artery widely patent to the basilar. No basilar stenosis. Posterior circulation branch vessels are patent.   Anatomic variants: None other significant.   Other: None.   IMPRESSION: 1. Signal loss in the right carotid siphon region related to the flow diverting stent. I do not see evidence of residual flow within the previously shown aneurysm. 2. No flow seen in a tiny right vertebral artery. Left vertebral artery widely patent to the basilar.     Electronically Signed   By: Paulina Fusi M.D.   On: 05/15/2023 14:21  Labs:  CBC: Recent Labs    07/21/22 1151 04/08/23 0759  WBC 5.7 4.9  HGB 14.3 14.1  HCT 41.9 42.6  PLT 298.0 300.0    COAGS: No results for input(s): "INR", "APTT" in the last 8760 hours.  BMP: Recent Labs    07/21/22 1151 04/08/23 0759  NA 138 139  K 4.0 3.8  CL 103 103  CO2 30 27  GLUCOSE 97 99  BUN 12 20  CALCIUM 9.5 9.5  CREATININE 0.78 0.81    LIVER FUNCTION TESTS: Recent Labs    07/21/22 1151 04/08/23 0759  BILITOT 0.7 0.5  AST 20 22  ALT 23 24  ALKPHOS 107  121*  PROT 7.2 7.2  ALBUMIN 4.3 4.2    Assessment:  I reviewed MRA of the brain with Miss Timko.  She was extremely anxious saying that she is going through a lot of stress in her personal and professional life.  She says she has been having a lot of right frontal headache with ocular pressure but says these have been present since prior to her aneurysm treatment, for more than 5 years.  She denies left-sided weakness, paresthesia or other neurological deficit.  She has also been stressed because she did not understand what the brain MR angiogram report says and she was not contacted immediately after the study was done.  Unfortunately, there was a delay in scheduling Miss Brockman's appointment, but I assured her that nothing serious had been found on the MR angiogram.  The MR angiogram shows susceptibility artifact related to the flow diverter in the intracranial right ICA with no evidence of residual aneurysm.  On my personal review, I believe there is some degree of mild retraction and fish-mouthing of the distal end of the flow diverter but that appears to be stable, with less than 50% stenosis and it is clinically asymptomatic.  I explained to her that there was no immediate need to do anything about this finding.  However, Miss Ingman was very anxious about the possibility of something happening in the future and she worries that other physicians might not know what to look for.  I explained that everything would be detailed in my note so that any physician with access to her medical records could see what we discussed in today's appointment.  We agreed on doing a repeat imaging in 6 months from prior MRA to evaluate for any progression.  Initially, we discussed repeating MR angiogram.  However, I believe it will be easier to measure the stent for any stenosis with CT angiogram.  I did explain that a precise measurement can only be obtained with a catheter cerebral angiogram which would show possible  neointimal hyperplasia.  She would prefer to avoid the cerebral angiogram at this moment.  We will reach out to her to schedule her CTA in a few months.    Signed: Baldemar Lenis, MD 07/09/2023, 4:15 PM    I spent a total of  40 Minutes in face to face in clinical consultation, greater than 50% of which was counseling/coordinating care for cerebral angiogram status post embolization.

## 2023-07-10 ENCOUNTER — Other Ambulatory Visit (HOSPITAL_COMMUNITY): Payer: Self-pay

## 2023-07-10 MED ORDER — FLUTICASONE PROPIONATE 50 MCG/ACT NA SUSP
2.0000 | Freq: Every day | NASAL | 2 refills | Status: DC
  Filled 2023-07-10: qty 48, 90d supply, fill #0
  Filled 2024-02-10: qty 48, 90d supply, fill #1

## 2023-07-10 MED ORDER — OMEPRAZOLE 20 MG PO CPDR
20.0000 mg | DELAYED_RELEASE_CAPSULE | Freq: Two times a day (BID) | ORAL | 0 refills | Status: DC
  Filled 2023-07-10: qty 180, 90d supply, fill #0

## 2023-07-10 MED ORDER — CETIRIZINE HCL 10 MG PO TABS
10.0000 mg | ORAL_TABLET | Freq: Every day | ORAL | 1 refills | Status: DC
  Filled 2023-07-10: qty 90, 90d supply, fill #0

## 2023-07-12 ENCOUNTER — Other Ambulatory Visit (HOSPITAL_COMMUNITY): Payer: Self-pay

## 2023-07-12 ENCOUNTER — Other Ambulatory Visit: Payer: Self-pay

## 2023-07-26 ENCOUNTER — Ambulatory Visit: Payer: Medicare HMO | Admitting: Dermatology

## 2023-09-06 ENCOUNTER — Other Ambulatory Visit (HOSPITAL_COMMUNITY): Payer: Self-pay | Admitting: Interventional Radiology

## 2023-09-06 DIAGNOSIS — I671 Cerebral aneurysm, nonruptured: Secondary | ICD-10-CM

## 2023-09-09 DIAGNOSIS — Z01419 Encounter for gynecological examination (general) (routine) without abnormal findings: Secondary | ICD-10-CM | POA: Diagnosis not present

## 2023-09-09 DIAGNOSIS — Z1231 Encounter for screening mammogram for malignant neoplasm of breast: Secondary | ICD-10-CM | POA: Diagnosis not present

## 2023-09-09 DIAGNOSIS — N952 Postmenopausal atrophic vaginitis: Secondary | ICD-10-CM | POA: Diagnosis not present

## 2023-09-09 DIAGNOSIS — R350 Frequency of micturition: Secondary | ICD-10-CM | POA: Diagnosis not present

## 2023-09-09 DIAGNOSIS — Z6831 Body mass index (BMI) 31.0-31.9, adult: Secondary | ICD-10-CM | POA: Diagnosis not present

## 2023-09-13 ENCOUNTER — Encounter: Payer: Self-pay | Admitting: Adult Health

## 2023-09-13 ENCOUNTER — Telehealth (INDEPENDENT_AMBULATORY_CARE_PROVIDER_SITE_OTHER): Admitting: Adult Health

## 2023-09-13 DIAGNOSIS — F411 Generalized anxiety disorder: Secondary | ICD-10-CM

## 2023-09-13 DIAGNOSIS — F331 Major depressive disorder, recurrent, moderate: Secondary | ICD-10-CM | POA: Diagnosis not present

## 2023-09-13 MED ORDER — LORAZEPAM 0.5 MG PO TABS: 0.5000 mg | ORAL_TABLET | Freq: Every day | ORAL | 0 refills | Status: DC | PRN

## 2023-09-13 MED ORDER — DESVENLAFAXINE SUCCINATE ER 25 MG PO TB24: 25.0000 mg | ORAL_TABLET | Freq: Every day | ORAL | 2 refills | Status: DC

## 2023-09-13 NOTE — Progress Notes (Signed)
 Kimberly Conway [id number] [birth year] 68 y.o.  Virtual Visit via Video Note  I connected with pt @ on 09/13/23 at  2:30 PM EDT by a video enabled telemedicine application and verified that I am speaking with the correct person using two identifiers.   I discussed the limitations of evaluation and management by telemedicine and the availability of in person appointments. The patient expressed understanding and agreed to proceed.  I discussed the assessment and treatment plan with the patient. The patient was provided an opportunity to ask questions and all were answered. The patient agreed with the plan and demonstrated an understanding of the instructions.   The patient was advised to call back or seek an in-person evaluation if the symptoms worsen or if the condition fails to improve as anticipated.  I provided 25 minutes of non-face-to-face time during this encounter.  The patient was located at home.  The provider was located at [facility].   Dorothyann Gibbs, NP   Subjective:   Patient ID:  Kimberly Conway is a 68 y.o. (DOB 06/27/1955) female.  Chief Complaint: No chief complaint on file.   HPI Markel A Stitt presents for follow-up of MDD and GAD.  Describes mood today as "ok". Pleasant. Reports tearfulness. Mood symptoms - reports depression, anxiety and irritability. Reports varying interest and motivation. Reports panic attacks. Reports worry, rumination, and over thinking. Reports ongoing issues with current living environment. Reports being unemployed, but looking for a job. Mood is variable - "up and down". Stating "I feel like I'm doing well, not great". Would like to try and taper off the Pristiq due to ongoing side effects - "noise in my head". Taking medications as prescribed. Energy levels stable. Active, does not have a regular exercise routine.  Enjoys some usual interests and activities. Single. Lives alone with 2 cats. No children. Has a sister local. Other family  lives in [place], [place].  Appetite adequate. Weight stable - 155 to 157  pounds, 61". Reports sleeping better some nights than others. Averages 6 hours - "less over the past few months". Focus and concentration - "it's all over the place". Completing tasks. Managing aspects of household. Currently unemployed - looking for work. Denies SI or HI.  Denies AH or VH.  Denies self harm. Denies substance use.  Previous medication trials: Ativan, Pristiq  Review of Systems:  Review of Systems  Musculoskeletal:  Negative for gait problem.  Neurological:  Negative for tremors.  Psychiatric/Behavioral:         Please refer to HPI    Medications: I have reviewed the patient's current medications.  Current Outpatient Medications  Medication Sig Dispense Refill   aspirin EC 81 MG tablet Take 81 mg by mouth in the morning. Swallow whole.     b complex vitamins tablet Take 1 tablet by mouth in the morning.     Calcium Citrate (CITRACAL PO) Take 3 tablets by mouth daily after lunch.     cetirizine (ZYRTEC) 10 MG tablet Take 1 tablet (10 mg total) by mouth daily. 90 tablet 1   cetirizine (ZYRTEC) 10 MG tablet Take 1 tablet (10 mg total) by mouth daily. 90 tablet 1   desvenlafaxine (PRISTIQ) 25 MG 24 hr tablet Take 1 tablet (25 mg total) by mouth daily. 30 tablet 2   fluticasone (FLONASE) 50 MCG/ACT nasal spray USE 2 SPRAYS IN EACH NOSTRIL EVERY DAY 48 g 3   fluticasone (FLONASE) 50 MCG/ACT nasal spray Place 2 sprays into both nostrils  daily. 48 g 2   levocetirizine (XYZAL) 5 MG tablet Take 1 tablet (5 mg total) by mouth every evening. 90 tablet 1   LORazepam (ATIVAN) 0.5 MG tablet Take 1 tablet (0.5 mg total) by mouth daily as needed. 30 tablet 0   Misc Natural Products (GLUCOSAMINE CHONDROITIN TRIPLE) TABS Take 1 tablet by mouth daily.     Multiple Vitamin (MULTIVITAMIN) tablet Take 1 tablet by mouth daily after lunch.     omeprazole (PRILOSEC) 20 MG capsule TAKE 1 CAPSULE TWICE DAILY BEFORE MEALS  180 capsule 1   omeprazole (PRILOSEC) 20 MG capsule Take 1 capsule (20 mg total) by mouth 2 (two) times daily before a meal. 180 capsule 0   Polyethyl Glycol-Propyl Glycol (LUBRICANT EYE DROPS) 0.4-0.3 % SOLN Place 1-2 drops into both eyes 3 (three) times daily as needed (dry/irritated eyes).     Probiotic Product (PROBIOTIC PO) Take 1 capsule by mouth in the morning.     sodium chloride (OCEAN) 0.65 % SOLN nasal spray Place 1 spray into both nostrils 2 (two) times daily.     terbinafine (LAMISIL) 250 MG tablet Take 250 mg by mouth daily.     Turmeric 500 MG CAPS Take 500 mg by mouth daily after lunch.     No current facility-administered medications for this visit.    Medication Side Effects: None  Allergies: No Known Allergies  Past Medical History:  Diagnosis Date   Anginal pain (HCC)    Depression    Family history of adverse reaction to anesthesia    sister gets nauseous after anesthesia   GAD (generalized anxiety disorder)    GERD (gastroesophageal reflux disease)    Hepatitis B 1965   HTN (hypertension)    Multiple thyroid nodules    Other atopic dermatitis 08/17/2020   Seasonal allergies    Urticaria    Vertigo     Family History  Problem Relation Age of Onset   Diabetes Mother    Breast cancer Mother    CAD Father    Hypertension Sister    Breast cancer Sister    Migraines Sister    Migraines Sister    Diabetes Brother    Colon cancer Neg Hx    Esophageal cancer Neg Hx    Pancreatic cancer Neg Hx    Stomach cancer Neg Hx     Social History   Socioeconomic History   Marital status: Single    Spouse name: Not on file   Number of children: Not on file   Years of education: Not on file   Highest education level: Associate degree: academic program  Occupational History   Not on file  Tobacco Use   Smoking status: Former    Current packs/day: 0.00    Types: Cigarettes    Quit date: 06/21/2014    Years since quitting: 9.2   Smokeless tobacco: Never   Vaping Use   Vaping status: Never Used  Substance and Sexual Activity   Alcohol use: Not Currently    Comment: Quit in 2016   Drug use: Never   Sexual activity: Not Currently    Birth control/protection: Post-menopausal  Other Topics Concern   Not on file  Social History Narrative   Right handed   Social Drivers of Health   Financial Resource Strain: Patient Declined (01/15/2023)   Overall Financial Resource Strain (CARDIA)    Difficulty of Paying Living Expenses: Patient declined  Food Insecurity: Patient Declined (01/15/2023)   Hunger Vital Sign  Worried About Programme researcher, broadcasting/film/video in the Last Year: Patient declined    Barista in the Last Year: Patient declined  Transportation Needs: No Transportation Needs (01/15/2023)   PRAPARE - Administrator, Civil Service (Medical): No    Lack of Transportation (Non-Medical): No  Physical Activity: Sufficiently Active (01/15/2023)   Exercise Vital Sign    Days of Exercise per Week: 5 days    Minutes of Exercise per Session: 30 min  Stress: No Stress Concern Present (01/15/2023)   [employer] of Occupational Health - Occupational Stress Questionnaire    Feeling of Stress : Only a little  Social Connections: Unknown (01/15/2023)   Social Connection and Isolation Panel [NHANES]    Frequency of Communication with Friends and Family: Not on file    Frequency of Social Gatherings with Friends and Family: Once a week    Attends Religious Services: Not on Marketing executive or Organizations: Yes    Attends Banker Meetings: Not on file    Marital Status: Divorced  Intimate Partner Violence: Not on file    Past Medical History, Surgical history, Social history, and Family history were reviewed and updated as appropriate.   Please see review of systems for further details on the patient's review from today.   Objective:   Physical Exam:  There were no vitals taken for this visit.  Physical  Exam Constitutional:      General: She is not in acute distress. Musculoskeletal:        General: No deformity.  Neurological:     Mental Status: She is alert and oriented to person, place, and time.     Coordination: Coordination normal.  Psychiatric:        Attention and Perception: Attention and perception normal. She does not perceive auditory or visual hallucinations.        Mood and Affect: Affect is not labile, blunt, angry or inappropriate.        Speech: Speech normal.        Behavior: Behavior normal.        Thought Content: Thought content normal. Thought content is not paranoid or delusional. Thought content does not include homicidal or suicidal ideation. Thought content does not include homicidal or suicidal plan.        Cognition and Memory: Cognition and memory normal.        Judgment: Judgment normal.     Comments: Insight intact     Lab Review:     Component Value Date/Time   NA 139 04/08/2023 0759   K 3.8 04/08/2023 0759   CL 103 04/08/2023 0759   CO2 27 04/08/2023 0759   GLUCOSE 99 04/08/2023 0759   BUN 20 04/08/2023 0759   CREATININE 0.81 04/08/2023 0759   CALCIUM 9.5 04/08/2023 0759   PROT 7.2 04/08/2023 0759   PROT 7.5 05/01/2020 0000   ALBUMIN 4.2 04/08/2023 0759   AST 22 04/08/2023 0759   ALT 24 04/08/2023 0759   ALKPHOS 121 (H) 04/08/2023 0759   BILITOT 0.5 04/08/2023 0759   GFRNONAA >60 11/14/2021 0653       Component Value Date/Time   WBC 4.9 04/08/2023 0759   RBC 4.54 04/08/2023 0759   HGB 14.1 04/08/2023 0759   HCT 42.6 04/08/2023 0759   PLT 300.0 04/08/2023 0759   MCV 93.8 04/08/2023 0759   MCH 30.9 11/14/2021 0653   MCHC 33.2 04/08/2023 0759   RDW 13.0  04/08/2023 0759   LYMPHSABS 1.3 04/08/2023 0759   MONOABS 0.4 04/08/2023 0759   EOSABS 0.2 04/08/2023 0759   BASOSABS 0.1 04/08/2023 0759    No results found for: "POCLITH", "LITHIUM"   No results found for: "PHENYTOIN", "PHENOBARB", "VALPROATE", "CBMZ"   .res Assessment:  Plan:    Plan:  PDMP reviewed  Plan to decrease Pristiq 50mg  to 25 mg daily - patient wanting to taper off medication.  D/C - Ativan 0.5mg  daily as needed.  RTC as needed  Patient advised to contact office with any questions, adverse effects, or acute worsening in signs and symptoms.  25 minutes spent dedicated to the care of this patient on the date of this encounter to include pre-visit review of records, ordering of medication, post visit documentation, and face-to-face time with the patient discussing depression and anxiety. Discussed continuing current medication regimen.   Discussed potential benefits, risk, and side effects of benzodiazepines to include potential risk of tolerance and dependence, as well as possible drowsiness. Advised patient not to drive if experiencing drowsiness and to take lowest possible effective dose to minimize risk of dependence and tolerance.  Diagnoses and all orders for this visit:  Major depressive disorder, recurrent episode, moderate (HCC) -     desvenlafaxine (PRISTIQ) 25 MG 24 hr tablet; Take 1 tablet (25 mg total) by mouth daily.  Generalized anxiety disorder -     desvenlafaxine (PRISTIQ) 25 MG 24 hr tablet; Take 1 tablet (25 mg total) by mouth daily. -     LORazepam (ATIVAN) 0.5 MG tablet; Take 1 tablet (0.5 mg total) by mouth daily as needed.     Please see After Visit Summary for patient specific instructions.  Future Appointments  Date Time Provider Department Center  09/15/2023 11:00 AM [facility]-MR 3 [facility]  09/21/2023  1:00 PM [facility]-IR 3 [facility]    No orders of the defined types were placed in this encounter.     -------------------------------

## 2023-09-15 ENCOUNTER — Ambulatory Visit (HOSPITAL_COMMUNITY)
Admission: RE | Admit: 2023-09-15 | Discharge: 2023-09-15 | Disposition: A | Discharge status: Home / Self Care | Payer: Self-pay | Source: Ambulatory Visit | Attending: Interventional Radiology | Admitting: Interventional Radiology

## 2023-09-15 DIAGNOSIS — Z0389 Encounter for observation for other suspected diseases and conditions ruled out: Secondary | ICD-10-CM | POA: Diagnosis not present

## 2023-09-15 DIAGNOSIS — I671 Cerebral aneurysm, nonruptured: Secondary | ICD-10-CM | POA: Insufficient documentation

## 2023-09-21 ENCOUNTER — Telehealth (HOSPITAL_COMMUNITY): Payer: Self-pay

## 2023-09-21 ENCOUNTER — Ambulatory Visit (HOSPITAL_COMMUNITY)
Admission: RE | Admit: 2023-09-21 | Discharge: 2023-09-21 | Disposition: A | Discharge status: Home / Self Care | Payer: Self-pay | Source: Ambulatory Visit | Attending: Interventional Radiology | Admitting: Interventional Radiology

## 2023-09-21 NOTE — Telephone Encounter (Signed)
 Called twice to inform pt that she needs to be rescheduled today. Dr. Alvira Josephs had emergency add ons. No answer, left vm's. AB

## 2023-09-27 ENCOUNTER — Ambulatory Visit (HOSPITAL_COMMUNITY)
Admission: RE | Admit: 2023-09-27 | Discharge: 2023-09-27 | Disposition: A | Discharge status: Home / Self Care | Source: Ambulatory Visit | Attending: Interventional Radiology | Admitting: Interventional Radiology

## 2023-09-27 DIAGNOSIS — I671 Cerebral aneurysm, nonruptured: Secondary | ICD-10-CM

## 2023-09-27 DIAGNOSIS — I6521 Occlusion and stenosis of right carotid artery: Secondary | ICD-10-CM | POA: Diagnosis not present

## 2023-09-28 DIAGNOSIS — N958 Other specified menopausal and perimenopausal disorders: Secondary | ICD-10-CM | POA: Diagnosis not present

## 2023-09-28 DIAGNOSIS — M8588 Other specified disorders of bone density and structure, other site: Secondary | ICD-10-CM | POA: Diagnosis not present

## 2023-09-28 HISTORY — PX: IR RADIOLOGIST EVAL & MGMT: IMG5224

## 2023-10-14 DIAGNOSIS — M8580 Other specified disorders of bone density and structure, unspecified site: Secondary | ICD-10-CM | POA: Diagnosis not present

## 2023-11-09 ENCOUNTER — Encounter: Payer: Self-pay | Admitting: Internal Medicine

## 2023-11-10 ENCOUNTER — Telehealth: Payer: Self-pay

## 2023-11-10 NOTE — Telephone Encounter (Signed)
 Copied from CRM [id number]. Topic: General - Other >> Nov 10, 2023  8:37 AM Caliyah H wrote: Reason for CRM: Patient called stating she received two calls this morning and no voicemail was left regarding the call. I informed the patient of the latest message from the CMA of this morning patient is stated she is aware of that message.

## 2023-11-10 NOTE — Telephone Encounter (Signed)
 Noted

## 2023-12-01 ENCOUNTER — Ambulatory Visit: Admitting: Internal Medicine

## 2023-12-02 ENCOUNTER — Ambulatory Visit: Admitting: Internal Medicine

## 2023-12-04 ENCOUNTER — Encounter (HOSPITAL_COMMUNITY): Payer: Self-pay | Admitting: Interventional Radiology

## 2023-12-13 ENCOUNTER — Other Ambulatory Visit: Payer: Self-pay

## 2023-12-13 ENCOUNTER — Telehealth: Payer: Self-pay | Admitting: Adult Health

## 2023-12-13 DIAGNOSIS — F411 Generalized anxiety disorder: Secondary | ICD-10-CM

## 2023-12-13 DIAGNOSIS — F331 Major depressive disorder, recurrent, moderate: Secondary | ICD-10-CM

## 2023-12-13 MED ORDER — DESVENLAFAXINE SUCCINATE ER 25 MG PO TB24: 25.0000 mg | ORAL_TABLET | Freq: Every day | ORAL | 0 refills | Status: DC

## 2023-12-13 NOTE — Telephone Encounter (Signed)
 Rx for Pristiq  sent. Pended alprazolam.

## 2023-12-13 NOTE — Telephone Encounter (Signed)
 Please call to schedule FU. A 30-day supply of Ativan  is being sent today, need an appt for more.

## 2023-12-13 NOTE — Telephone Encounter (Signed)
 Patient called in for refill on Lorazepan 0.5mg  and Pristiq  25mg  Ph: [phone] Pharmacy [facility] [address] [place], [place]

## 2023-12-14 MED ORDER — LORAZEPAM 0.5 MG PO TABS: 0.5000 mg | ORAL_TABLET | Freq: Every day | ORAL | 0 refills | Status: DC | PRN

## 2023-12-14 NOTE — Telephone Encounter (Signed)
/  Appt 7/24

## 2023-12-30 ENCOUNTER — Encounter: Payer: Self-pay | Admitting: Adult Health

## 2023-12-30 ENCOUNTER — Telehealth: Admitting: Adult Health

## 2023-12-30 DIAGNOSIS — F411 Generalized anxiety disorder: Secondary | ICD-10-CM | POA: Diagnosis not present

## 2023-12-30 DIAGNOSIS — F331 Major depressive disorder, recurrent, moderate: Secondary | ICD-10-CM | POA: Diagnosis not present

## 2023-12-30 MED ORDER — LORAZEPAM 0.5 MG PO TABS: 0.5000 mg | ORAL_TABLET | Freq: Every day | ORAL | 1 refills | Status: DC | PRN

## 2023-12-30 NOTE — Progress Notes (Signed)
 Kimberly Conway [id number] 01/26/1956 68 y.o.  Virtual Visit via Video Note  I connected with pt @ on 12/30/23 at 10:00 AM EDT by a video enabled telemedicine application and verified that I am speaking with the correct person using two identifiers.   I discussed the limitations of evaluation and management by telemedicine and the availability of in person appointments. The patient expressed understanding and agreed to proceed.  I discussed the assessment and treatment plan with the patient. The patient was provided an opportunity to ask questions and all were answered. The patient agreed with the plan and demonstrated an understanding of the instructions.   The patient was advised to call back or seek an in-person evaluation if the symptoms worsen or if the condition fails to improve as anticipated.  I provided 25 minutes of non-face-to-face time during this encounter.  The patient was located at home.  The provider was located at [facility].   Angeline LOISE Sayers, NP   Subjective:   Patient ID:  Kimberly Conway is a 68 y.o. (DOB [birth year]) female.  Chief Complaint: No chief complaint on file.   HPI Kimberly Conway presents for follow-up of MDD and GAD.  Describes mood today as ok. Pleasant. Reports tearfulness. Mood symptoms - reports anxiety and irritability. Denies depression. Reports improved interest and motivation. Reports panic attacks - when cat was gone. Reports worry, rumination and over thinking - I need to get a job. Reports being unemployed, but looking for a job. Reports her family is providing financial support. Reports mood is variable - up and down. Stating it's been a tough year for me. Would like to try and finish taper off the Pristiq  - now at 25mg  daily. Taking medications as prescribed. Energy levels stable. Active, does not have a regular exercise routine.  Enjoys some usual interests and activities. Single. Lives alone with 3 cats. No children. Has a  sister local. Other family lives in [place], [place] .  Appetite adequate. Weight stable - 155 to 157  pounds, 61. Reports sleeping better some nights than others. Averages 6 hours. Reports some daytime napping. Reports focus and concentration stable. Completing tasks. Managing aspects of household. Currently unemployed - looking for work. Denies SI or HI.  Denies AH or VH.  Denies self harm. Denies substance use.  Previous medication trials: Ativan , Pristiq   Review of Systems:  Review of Systems  Allergic/Immunologic: Negative.   Neurological: Negative.   Psychiatric/Behavioral:         Please refer to HPI    Medications: I have reviewed the patient's current medications.  Current Outpatient Medications  Medication Sig Dispense Refill   aspirin  EC 81 MG tablet Take 81 mg by mouth in the morning. Swallow whole.     b complex vitamins tablet Take 1 tablet by mouth in the morning.     Calcium Citrate (CITRACAL PO) Take 3 tablets by mouth daily after lunch.     cetirizine  (ZYRTEC ) 10 MG tablet Take 1 tablet (10 mg total) by mouth daily. 90 tablet 1   cetirizine  (ZYRTEC ) 10 MG tablet Take 1 tablet (10 mg total) by mouth daily. 90 tablet 1   desvenlafaxine  (PRISTIQ ) 25 MG 24 hr tablet Take 1 tablet (25 mg total) by mouth daily. 90 tablet 0   fluticasone  (FLONASE ) 50 MCG/ACT nasal spray USE 2 SPRAYS IN EACH NOSTRIL EVERY DAY 48 g 3   fluticasone  (FLONASE ) 50 MCG/ACT nasal spray Place 2 sprays into both nostrils daily. 48 g 2  levocetirizine (XYZAL ) 5 MG tablet Take 1 tablet (5 mg total) by mouth every evening. 90 tablet 1   LORazepam  (ATIVAN ) 0.5 MG tablet Take 1 tablet (0.5 mg total) by mouth daily as needed. 30 tablet 1   Misc Natural Products (GLUCOSAMINE CHONDROITIN TRIPLE) TABS Take 1 tablet by mouth daily.     Multiple Vitamin (MULTIVITAMIN) tablet Take 1 tablet by mouth daily after lunch.     omeprazole  (PRILOSEC) 20 MG capsule TAKE 1 CAPSULE TWICE DAILY BEFORE MEALS 180 capsule 1    omeprazole  (PRILOSEC) 20 MG capsule Take 1 capsule (20 mg total) by mouth 2 (two) times daily before a meal. 180 capsule 0   Polyethyl Glycol-Propyl Glycol (LUBRICANT EYE DROPS) 0.4-0.3 % SOLN Place 1-2 drops into both eyes 3 (three) times daily as needed (dry/irritated eyes).     Probiotic Product (PROBIOTIC PO) Take 1 capsule by mouth in the morning.     sodium chloride  (OCEAN) 0.65 % SOLN nasal spray Place 1 spray into both nostrils 2 (two) times daily.     terbinafine (LAMISIL) 250 MG tablet Take 250 mg by mouth daily.     Turmeric 500 MG CAPS Take 500 mg by mouth daily after lunch.     No current facility-administered medications for this visit.    Medication Side Effects: None  Allergies: No Known Allergies  Past Medical History:  Diagnosis Date   Anginal pain (HCC)    Depression    Family history of adverse reaction to anesthesia    sister gets nauseous after anesthesia   GAD (generalized anxiety disorder)    GERD (gastroesophageal reflux disease)    Hepatitis B 1965   HTN (hypertension)    Multiple thyroid  nodules    Other atopic dermatitis 08/17/2020   Seasonal allergies    Urticaria    Vertigo     Family History  Problem Relation Age of Onset   Diabetes Mother    Breast cancer Mother    CAD Father    Hypertension Sister    Breast cancer Sister    Migraines Sister    Migraines Sister    Diabetes Brother    Colon cancer Neg Hx    Esophageal cancer Neg Hx    Pancreatic cancer Neg Hx    Stomach cancer Neg Hx     Social History   Socioeconomic History   Marital status: Single    Spouse name: Not on file   Number of children: Not on file   Years of education: Not on file   Highest education level: Associate degree: academic program  Occupational History   Not on file  Tobacco Use   Smoking status: Former    Current packs/day: 0.00    Types: Cigarettes    Quit date: 06/21/2014    Years since quitting: 9.5   Smokeless tobacco: Never  Vaping Use    Vaping status: Never Used  Substance and Sexual Activity   Alcohol use: Not Currently    Comment: Quit in 2016   Drug use: Never   Sexual activity: Not Currently    Birth control/protection: Post-menopausal  Other Topics Concern   Not on file  Social History Narrative   Right handed   Social Drivers of Health   Financial Resource Strain: Patient Declined (01/15/2023)   Overall Financial Resource Strain (CARDIA)    Difficulty of Paying Living Expenses: Patient declined  Food Insecurity: Patient Declined (01/15/2023)   Hunger Vital Sign    Worried About Running Out of  Food in the Last Year: Patient declined    Ran Out of Food in the Last Year: Patient declined  Transportation Needs: No Transportation Needs (01/15/2023)   PRAPARE - Administrator, Civil Service (Medical): No    Lack of Transportation (Non-Medical): No  Physical Activity: Sufficiently Active (01/15/2023)   Exercise Vital Sign    Days of Exercise per Week: 5 days    Minutes of Exercise per Session: 30 min  Stress: No Stress Concern Present (01/15/2023)   [employer] of Occupational Health - Occupational Stress Questionnaire    Feeling of Stress : Only a little  Social Connections: Unknown (01/15/2023)   Social Connection and Isolation Panel    Frequency of Communication with Friends and Family: Not on file    Frequency of Social Gatherings with Friends and Family: Once a week    Attends Religious Services: Not on Marketing executive or Organizations: Yes    Attends Banker Meetings: Not on file    Marital Status: Divorced  Intimate Partner Violence: Not on file    Past Medical History, Surgical history, Social history, and Family history were reviewed and updated as appropriate.   Please see review of systems for further details on the patient's review from today.   Objective:   Physical Exam:  There were no vitals taken for this visit.  Physical Exam  Lab Review:      Component Value Date/Time   NA 139 04/08/2023 0759   K 3.8 04/08/2023 0759   CL 103 04/08/2023 0759   CO2 27 04/08/2023 0759   GLUCOSE 99 04/08/2023 0759   BUN 20 04/08/2023 0759   CREATININE 0.81 04/08/2023 0759   CALCIUM 9.5 04/08/2023 0759   PROT 7.2 04/08/2023 0759   PROT 7.5 05/01/2020 0000   ALBUMIN  4.2 04/08/2023 0759   AST 22 04/08/2023 0759   ALT 24 04/08/2023 0759   ALKPHOS 121 (H) 04/08/2023 0759   BILITOT 0.5 04/08/2023 0759   GFRNONAA >60 11/14/2021 0653       Component Value Date/Time   WBC 4.9 04/08/2023 0759   RBC 4.54 04/08/2023 0759   HGB 14.1 04/08/2023 0759   HCT 42.6 04/08/2023 0759   PLT 300.0 04/08/2023 0759   MCV 93.8 04/08/2023 0759   MCH 30.9 11/14/2021 0653   MCHC 33.2 04/08/2023 0759   RDW 13.0 04/08/2023 0759   LYMPHSABS 1.3 04/08/2023 0759   MONOABS 0.4 04/08/2023 0759   EOSABS 0.2 04/08/2023 0759   BASOSABS 0.1 04/08/2023 0759    No results found for: POCLITH, LITHIUM   No results found for: PHENYTOIN, PHENOBARB, VALPROATE, CBMZ   .res Assessment: Plan:    Plan:  PDMP reviewed  Plan to discontinue Pristiq  25 mg daily - tapering off 50mg  daily  Ativan  0.5mg  daily as needed - trying not to take every day.  RTC 3 months  Patient advised to contact office with any questions, adverse effects, or acute worsening in signs and symptoms.  25 minutes spent dedicated to the care of this patient on the date of this encounter to include pre-visit review of records, ordering of medication, post visit documentation, and face-to-face time with the patient discussing depression and anxiety. Discussed continuing current medication regimen.   Discussed potential benefits, risk, and side effects of benzodiazepines to include potential risk of tolerance and dependence, as well as possible drowsiness. Advised patient not to drive if experiencing drowsiness and to take lowest possible effective dose  to minimize risk of dependence and  tolerance.  Diagnoses and all orders for this visit:  Major depressive disorder, recurrent episode, moderate (HCC)  Generalized anxiety disorder -     LORazepam  (ATIVAN ) 0.5 MG tablet; Take 1 tablet (0.5 mg total) by mouth daily as needed.     Please see After Visit Summary for patient specific instructions.  No future appointments.   No orders of the defined types were placed in this encounter.     -------------------------------

## 2024-01-07 ENCOUNTER — Ambulatory Visit: Payer: Self-pay | Admitting: Podiatry

## 2024-01-07 DIAGNOSIS — L600 Ingrowing nail: Secondary | ICD-10-CM | POA: Diagnosis not present

## 2024-01-07 NOTE — Progress Notes (Signed)
 Subjective:  Patient ID: Kimberly Conway, female    DOB: [birth year],  MRN: [id number]  Chief Complaint  Patient presents with   Ingrown Toenail    Bilateral ingrowns     68 y.o. female presents with the above complaint.  Patient presents with bilateral hallux medial border ingrown painful to touch has progressed around hurts over the arch with ambulation worse with pressure would like to have removed has not seen MRIs prior to seeing me denies any other acute complaints.  Pain scale 7 out of 10 dull aching nature   Review of Systems: Negative except as noted in the HPI. Denies N/V/F/Ch.  Past Medical History:  Diagnosis Date   Anginal pain (HCC)    Depression    Family history of adverse reaction to anesthesia    sister gets nauseous after anesthesia   GAD (generalized anxiety disorder)    GERD (gastroesophageal reflux disease)    Hepatitis B 1965   HTN (hypertension)    Multiple thyroid  nodules    Other atopic dermatitis 08/17/2020   Seasonal allergies    Urticaria    Vertigo     Current Outpatient Medications:    BOOSTRIX 5-2.5-18.5 LF-MCG/0.5 injection, , Disp: , Rfl:    CAPVAXIVE 0.5 ML injection, , Disp: , Rfl:    metoprolol  succinate (TOPROL -XL) 25 MG 24 hr tablet, Take 0.5 tablets by mouth., Disp: , Rfl:    Omega-3 Fatty Acids (OMEGA III EPA+DHA) 1000 MG CAPS, Take 1 tablet by mouth daily., Disp: , Rfl:    aspirin  EC 81 MG tablet, Take 81 mg by mouth in the morning. Swallow whole., Disp: , Rfl:    Calcium Citrate (CITRACAL PO), Take 3 tablets by mouth daily after lunch., Disp: , Rfl:    clotrimazole -betamethasone  (LOTRISONE ) cream, APPLY CREAM TOPICALLY TWICE DAILY FOR 21 DAYS, Disp: , Rfl:    desvenlafaxine  (PRISTIQ ) 25 MG 24 hr tablet, Take 1 tablet (25 mg total) by mouth daily., Disp: 90 tablet, Rfl: 0   estradiol (ESTRACE) 0.1 MG/GM vaginal cream, APPLY AS DIRECTED THREE TIMES A WEEK, Disp: , Rfl:    fluconazole  (DIFLUCAN ) 150 MG tablet, Take 1 tablet by mouth once a  week., Disp: , Rfl:    fluticasone  (FLONASE ) 50 MCG/ACT nasal spray, Place 2 sprays into both nostrils daily., Disp: 48 g, Rfl: 2   levocetirizine (XYZAL ) 5 MG tablet, Take 1 tablet (5 mg total) by mouth every evening., Disp: 90 tablet, Rfl: 1   LORazepam  (ATIVAN ) 0.5 MG tablet, Take 1 tablet (0.5 mg total) by mouth daily as needed., Disp: 30 tablet, Rfl: 1   Misc Natural Products (GLUCOSAMINE CHONDROITIN TRIPLE) TABS, Take 1 tablet by mouth daily., Disp: , Rfl:    Multiple Vitamin (MULTIVITAMIN) tablet, Take 1 tablet by mouth daily after lunch., Disp: , Rfl:    omeprazole  (PRILOSEC) 20 MG capsule, TAKE 1 CAPSULE TWICE DAILY BEFORE MEALS, Disp: 180 capsule, Rfl: 1   Polyethyl Glycol-Propyl Glycol (LUBRICANT EYE DROPS) 0.4-0.3 % SOLN, Place 1-2 drops into both eyes 3 (three) times daily as needed (dry/irritated eyes)., Disp: , Rfl:    Probiotic Product (PROBIOTIC PO), Take 1 capsule by mouth in the morning., Disp: , Rfl:    sodium chloride  (OCEAN) 0.65 % SOLN nasal spray, Place 1 spray into both nostrils 2 (two) times daily., Disp: , Rfl:    Turmeric 500 MG CAPS, Take 500 mg by mouth daily after lunch., Disp: , Rfl:   Social History   Tobacco Use  Smoking Status  Former   Current packs/day: 0.00   Types: Cigarettes   Quit date: 06/21/2014   Years since quitting: 9.5  Smokeless Tobacco Never    No Known Allergies Objective:  There were no vitals filed for this visit. There is no height or weight on file to calculate BMI. Constitutional Well developed. Well nourished.  Vascular Dorsalis pedis pulses palpable bilaterally. Posterior tibial pulses palpable bilaterally. Capillary refill normal to all digits.  No cyanosis or clubbing noted. Pedal hair growth normal.  Neurologic Normal speech. Oriented to person, place, and time. Epicritic sensation to light touch grossly present bilaterally.  Dermatologic Painful ingrowing nail at medial nail borders of the hallux nail bilaterally. No  other open wounds. No skin lesions.  Orthopedic: Normal joint ROM without pain or crepitus bilaterally. No visible deformities. No bony tenderness.   Radiographs: None Assessment:   1. Ingrown toenail of right foot   2. Ingrown left big toenail    Plan:  Patient was evaluated and treated and all questions answered.  Ingrown Nail, bilaterally -Patient elects to proceed with minor surgery to remove ingrown toenail removal today. Consent reviewed and signed by patient. -Ingrown nail excised. See procedure note. -Educated on post-procedure care including soaking. Written instructions provided and reviewed. -Patient to follow up in 2 weeks for nail check.  Procedure: Excision of Ingrown Toenail Location: Bilateral 1st toe medial nail borders. Anesthesia: Lidocaine  1% plain; 1.5 mL and Marcaine 0.5% plain; 1.5 mL, digital block. Skin Prep: Betadine. Dressing: Silvadene; telfa; dry, sterile, compression dressing. Technique: Following skin prep, the toe was exsanguinated and a tourniquet was secured at the base of the toe. The affected nail border was freed, split with a nail splitter, and excised. Chemical matrixectomy was then performed with phenol and irrigated out with alcohol. The tourniquet was then removed and sterile dressing applied. Disposition: Patient tolerated procedure well. Patient to return in 2 weeks for follow-up.   No follow-ups on file.

## 2024-01-13 ENCOUNTER — Ambulatory Visit
Admission: EM | Admit: 2024-01-13 | Discharge: 2024-01-13 | Disposition: A | Discharge status: Home / Self Care | Attending: Family Medicine | Admitting: Family Medicine

## 2024-01-13 DIAGNOSIS — W540XXA Bitten by dog, initial encounter: Secondary | ICD-10-CM

## 2024-01-13 DIAGNOSIS — M79632 Pain in left forearm: Secondary | ICD-10-CM

## 2024-01-13 MED ORDER — AMOXICILLIN-POT CLAVULANATE 875-125 MG PO TABS
1.0000 | ORAL_TABLET | Freq: Two times a day (BID) | ORAL | 0 refills | Status: DC
Start: 2024-01-13 — End: 2024-01-25

## 2024-01-13 NOTE — ED Provider Notes (Signed)
 [facility]  Note:  This document was prepared using Conservation officer, historic buildings and may include unintentional dictation errors.  MRN: [id number] DOB: 01/20/1956  Subjective:   Kimberly Conway is a 67 y.o. female presenting for left forearm pain.  Patient suffered a dog bite to the left ventral surface of her elbow.  This was her neighbors dog, is up-to-date on vaccines.  Patient's Tdap is up-to-date was done in February of this year.  The dog bite happened 20 minutes prior to arrival in clinic.  She came straight to our clinic.  No current facility-administered medications for this encounter.  Current Outpatient Medications:    aspirin  EC 81 MG tablet, Take 81 mg by mouth in the morning. Swallow whole., Disp: , Rfl:    BOOSTRIX 5-2.5-18.5 LF-MCG/0.5 injection, , Disp: , Rfl:    Calcium Citrate (CITRACAL PO), Take 3 tablets by mouth daily after lunch., Disp: , Rfl:    CAPVAXIVE 0.5 ML injection, , Disp: , Rfl:    clotrimazole -betamethasone  (LOTRISONE ) cream, APPLY CREAM TOPICALLY TWICE DAILY FOR 21 DAYS, Disp: , Rfl:    desvenlafaxine  (PRISTIQ ) 25 MG 24 hr tablet, Take 1 tablet (25 mg total) by mouth daily., Disp: 90 tablet, Rfl: 0   estradiol (ESTRACE) 0.1 MG/GM vaginal cream, APPLY AS DIRECTED THREE TIMES A WEEK, Disp: , Rfl:    fluconazole  (DIFLUCAN ) 150 MG tablet, Take 1 tablet by mouth once a week., Disp: , Rfl:    fluticasone  (FLONASE ) 50 MCG/ACT nasal spray, Place 2 sprays into both nostrils daily., Disp: 48 g, Rfl: 2   levocetirizine (XYZAL ) 5 MG tablet, Take 1 tablet (5 mg total) by mouth every evening., Disp: 90 tablet, Rfl: 1   LORazepam  (ATIVAN ) 0.5 MG tablet, Take 1 tablet (0.5 mg total) by mouth daily as needed., Disp: 30 tablet, Rfl: 1   metoprolol  succinate (TOPROL -XL) 25 MG 24 hr tablet, Take 0.5 tablets by mouth., Disp: , Rfl:    Misc Natural Products (GLUCOSAMINE CHONDROITIN TRIPLE) TABS, Take 1 tablet by mouth daily., Disp: , Rfl:    Multiple  Vitamin (MULTIVITAMIN) tablet, Take 1 tablet by mouth daily after lunch., Disp: , Rfl:    Omega-3 Fatty Acids (OMEGA III EPA+DHA) 1000 MG CAPS, Take 1 tablet by mouth daily., Disp: , Rfl:    omeprazole  (PRILOSEC) 20 MG capsule, TAKE 1 CAPSULE TWICE DAILY BEFORE MEALS, Disp: 180 capsule, Rfl: 1   Polyethyl Glycol-Propyl Glycol (LUBRICANT EYE DROPS) 0.4-0.3 % SOLN, Place 1-2 drops into both eyes 3 (three) times daily as needed (dry/irritated eyes)., Disp: , Rfl:    Probiotic Product (PROBIOTIC PO), Take 1 capsule by mouth in the morning., Disp: , Rfl:    sodium chloride  (OCEAN) 0.65 % SOLN nasal spray, Place 1 spray into both nostrils 2 (two) times daily., Disp: , Rfl:    Turmeric 500 MG CAPS, Take 500 mg by mouth daily after lunch., Disp: , Rfl:    No Known Allergies  Past Medical History:  Diagnosis Date   Anginal pain (HCC)    Depression    Family history of adverse reaction to anesthesia    sister gets nauseous after anesthesia   GAD (generalized anxiety disorder)    GERD (gastroesophageal reflux disease)    Hepatitis B 1965   HTN (hypertension)    Multiple thyroid  nodules    Other atopic dermatitis 08/17/2020   Seasonal allergies    Urticaria    Vertigo      Past Surgical History:  Procedure Laterality Date   APPENDECTOMY     COLONOSCOPY  08/28/2019   DILATION AND CURETTAGE OF UTERUS     IR 3D INDEPENDENT WKST  04/24/2021   IR ANGIO EXTERNAL CAROTID SEL EXT CAROTID UNI R MOD SED  04/24/2021   IR ANGIO INTRA EXTRACRAN SEL COM CAROTID INNOMINATE BILAT MOD SED  11/14/2021   IR ANGIO INTRA EXTRACRAN SEL COM CAROTID INNOMINATE UNI R MOD SED  02/14/2021   IR ANGIO INTRA EXTRACRAN SEL INTERNAL CAROTID UNI L MOD SED  02/14/2021   IR ANGIO INTRA EXTRACRAN SEL INTERNAL CAROTID UNI R MOD SED  04/24/2021   IR ANGIO VERTEBRAL SEL SUBCLAVIAN INNOMINATE UNI R MOD SED  02/14/2021   IR ANGIO VERTEBRAL SEL VERTEBRAL UNI L MOD SED  02/14/2021   IR ANGIO VERTEBRAL SEL VERTEBRAL UNI L MOD SED   11/14/2021   IR CT HEAD LTD  04/24/2021   IR PTA INTRACRANIAL  04/24/2021   IR RADIOLOGIST EVAL & MGMT  09/28/2023   IR TRANSCATH/EMBOLIZ  04/24/2021   IR US  GUIDE VASC ACCESS RIGHT  02/14/2021   IR US  GUIDE VASC ACCESS RIGHT  04/24/2021   IR US  GUIDE VASC ACCESS RIGHT  11/14/2021   RADIOLOGY WITH ANESTHESIA N/A 04/24/2021   Procedure: EMBOLIZATION;  Surgeon: de Macedo Rodrigues, Katyucia, MD;  Location: [facility] OR;  Service: Radiology;  Laterality: N/A;   UPPER GI ENDOSCOPY  08/28/2019    Family History  Problem Relation Age of Onset   Diabetes Mother    Breast cancer Mother    CAD Father    Hypertension Sister    Breast cancer Sister    Migraines Sister    Migraines Sister    Diabetes Brother    Colon cancer Neg Hx    Esophageal cancer Neg Hx    Pancreatic cancer Neg Hx    Stomach cancer Neg Hx     Social History   Tobacco Use   Smoking status: Former    Current packs/day: 0.00    Types: Cigarettes    Quit date: 06/21/2014    Years since quitting: 9.5   Smokeless tobacco: Never  Vaping Use   Vaping status: Never Used  Substance Use Topics   Alcohol use: Not Currently    Comment: Quit in 2016   Drug use: Never    ROS   Objective:   Vitals: BP (!) 144/75 (BP Location: Right Arm)   Pulse (!) 59   Temp 98 F (36.7 C) (Oral)   Resp 18   SpO2 98%   Physical Exam Constitutional:      General: She is not in acute distress.    Appearance: Normal appearance. She is well-developed. She is not ill-appearing, toxic-appearing or diaphoretic.  HENT:     Head: Normocephalic and atraumatic.     Nose: Nose normal.     Mouth/Throat:     Mouth: Mucous membranes are moist.  Eyes:     General: No scleral icterus.       Right eye: No discharge.        Left eye: No discharge.     Extraocular Movements: Extraocular movements intact.  Cardiovascular:     Rate and Rhythm: Normal rate.  Pulmonary:     Effort: Pulmonary effort is normal.  Musculoskeletal:        Arms:  Skin:    General: Skin is warm and dry.  Neurological:     General: No focal deficit present.     Mental Status: She  is alert and oriented to person, place, and time.  Psychiatric:        Mood and Affect: Mood normal.        Behavior: Behavior normal.    PROCEDURE NOTE: laceration repair Verbal consent obtained from patient.  Local anesthesia with 8cc Lidocaine  2% with epinephrine.  Wound explored for tendon, ligament damage. Wound scrubbed with soap and water and rinsed. Wound closed loosely with #1 4-0 Ethilon (horizontal mattress) sutures.  Wound cleansed and dressed.   Assessment and Plan :   PDMP not reviewed this encounter.  1. Left forearm pain   2. Dog bite, initial encounter    Recommend antibiotic prophylaxis with Augmentin .  Wound closed loosely as above.  Recommend ibuprofen for pain and inflammation.  Wound care reviewed extensively with the patient.  Return to clinic in 2 weeks for suture removal.  Counseled patient on potential for adverse effects with medications prescribed/recommended today, ER and return-to-clinic precautions discussed, patient verbalized understanding.    Christopher Savannah, [place] 01/13/24 [id number]

## 2024-01-13 NOTE — Discharge Instructions (Addendum)
 Change your dressing 2-3 times daily. Every time you change your dressing, clean the wound gently with warm water and Dial antibacterial soap. Pat the wound dry, let it breathe for roughly an hour before covering it back up. When you reapply a dressing, apply ointment to the wound, then cover with non-stick/non-adherent gauze.  You can allow for 30 minutes - 1 hour of air time after cleaning the wound before covering. Use ibuprofen for pain and inflammation at a dose of 600mg  as needed. Return to the clinic in 2 weeks. You should return sooner if you develop fever, redness, drainage of pus, worsening pain.

## 2024-01-13 NOTE — ED Triage Notes (Signed)
 Pt reports she was bit by a dog in the left arm 20 min ago by her neighbor dog. Per pt and neighbor the dog is up to date on vaccines.  Pt denies pain in the left arm.

## 2024-01-14 ENCOUNTER — Ambulatory Visit: Payer: Self-pay

## 2024-01-14 ENCOUNTER — Encounter: Payer: Self-pay | Admitting: Family Medicine

## 2024-01-14 ENCOUNTER — Ambulatory Visit: Admitting: Family Medicine

## 2024-01-14 VITALS — BP 124/70 | HR 100 | Temp 97.7°F | Wt 156.5 lb

## 2024-01-14 DIAGNOSIS — S51852D Open bite of left forearm, subsequent encounter: Secondary | ICD-10-CM

## 2024-01-14 DIAGNOSIS — W540XXD Bitten by dog, subsequent encounter: Secondary | ICD-10-CM

## 2024-01-14 NOTE — Telephone Encounter (Signed)
 Appointment made for today 01/14/2024 at 10:00 AM with Dr Wolm Scarlet     FYI Only or Action Required?: FYI only for provider.  Patient was last seen in primary care on 04/07/2023 by Theophilus Andrews, Tully GRADE, MD.  Called Nurse Triage reporting No chief complaint on file..  Symptoms began last night.  Interventions attempted: OTC medications: Ibuprofen, Prescription medications: Antibiotic, and Rest, hydration, or home remedies.  Symptoms are: gradually worsening.  Triage Disposition: Call PCP Within 24 Hours  Patient/caregiver understands and will follow disposition?:                    Copied from CRM #[id number]. Topic: Clinical - Red Word Triage >> Jan 14, 2024  8:06 AM Elle L wrote: Red Word that prompted transfer to Nurse Triage: The patient was bit by a dog yesterday and went to Urgent Care. However, she states she took the antibiotics that they gave her and they are making her really sick. It is causing her to throw up and have severe nausea. Reason for Disposition  Vomiting a prescription medication  Answer Assessment - Initial Assessment Questions Bit by dog yesterday--inside of elbow on left arm Urgent Care Stitches Augmentin  prescription Ate then took medicine around 4:45 PM Nausea/vomiting Patient took dramamine for the vomiting Patient drank Pedialyte Sipping water this morning Not hurting a lot--took Ibuprofen a few hours ago Patient states that the bandage is bothering her, she wants her PCP office to check the wound, and she also states that she vomited last night after taking the Augmentin  and states she doesn't take much antibiotics but she knows she needs to take something after this dog bite to prevent infection and wanted PCP advice on that Patient is advised that if anything changes to give us  a call and she is advised that if anything worsens to go to the Emergency Room. Patient verbalized understanding.   1. VOMITING SEVERITY: How  many times have you vomited in the past 24 hours?      3 times last night 2. ONSET: When did the vomiting begin?      Last night after taking antibiotic 3. FLUIDS: What fluids or food have you vomited up today? Have you been able to keep any fluids down?     Hasn't vomited this morning 4. ABDOMEN PAIN: Are your having any abdomen pain? If Yes : How bad is it and what does it feel like? (e.g., crampy, dull, intermittent, constant)      No 5. DIARRHEA: Is there any diarrhea? If Yes, ask: How many times today?      No 6. CONTACTS: Is there anyone else in the family with the same symptoms?      No 7. CAUSE: What do you think is causing your vomiting?     Antibiotic--patient doesn't take antibiotics much 8. HYDRATION STATUS: Any signs of dehydration? (e.g., dry mouth [not only dry lips], too weak to stand) When did you last urinate?     Patient has been sipping on water 9. OTHER SYMPTOMS: Do you have any other symptoms? (e.g., fever, headache, vertigo, vomiting blood or coffee grounds, recent head injury)     No  Protocols used: Vomiting-A-AH

## 2024-01-14 NOTE — Patient Instructions (Signed)
 Continue to clean wound daily with soap and water and then let air dry  Watch for signs of infection such as redness, warmth, or pus drainage  Confirm dose of last tetanus at pharmacy.

## 2024-01-14 NOTE — Progress Notes (Signed)
 Established Patient Office Visit  Subjective   Patient ID: Kimberly Conway, female    DOB: 1956/05/20  Age: 68 y.o. MRN: [id number]  Chief Complaint  Patient presents with   Animal Bite    HPI   Kimberly Conway is seen today with dog bite left forearm.  This occurred yesterday around 1:20 PM.  This was her neighbors dog.  Her neighbors dog has been fully vaccinated including rabies and patient was able to see confirmation of that yesterday.  Patient also states that she had a tetanus booster within the past year though we cannot see this in her record.  She went to urgent care yesterday and wound was irrigated and loosely approximated.  She was prescribed Augmentin  but after taking 1 dose had nausea and vomiting and has declined taking this further.  Still has a bit of lingering nausea.  Has not noted any redness or swelling around the wound.  Past Medical History:  Diagnosis Date   Anginal pain (HCC)    Depression    Family history of adverse reaction to anesthesia    sister gets nauseous after anesthesia   GAD (generalized anxiety disorder)    GERD (gastroesophageal reflux disease)    Hepatitis B 1965   HTN (hypertension)    Multiple thyroid  nodules    Other atopic dermatitis 08/17/2020   Seasonal allergies    Urticaria    Vertigo    Past Surgical History:  Procedure Laterality Date   APPENDECTOMY     COLONOSCOPY  08/28/2019   DILATION AND CURETTAGE OF UTERUS     IR 3D INDEPENDENT WKST  04/24/2021   IR ANGIO EXTERNAL CAROTID SEL EXT CAROTID UNI R MOD SED  04/24/2021   IR ANGIO INTRA EXTRACRAN SEL COM CAROTID INNOMINATE BILAT MOD SED  11/14/2021   IR ANGIO INTRA EXTRACRAN SEL COM CAROTID INNOMINATE UNI R MOD SED  02/14/2021   IR ANGIO INTRA EXTRACRAN SEL INTERNAL CAROTID UNI L MOD SED  02/14/2021   IR ANGIO INTRA EXTRACRAN SEL INTERNAL CAROTID UNI R MOD SED  04/24/2021   IR ANGIO VERTEBRAL SEL SUBCLAVIAN INNOMINATE UNI R MOD SED  02/14/2021   IR ANGIO VERTEBRAL SEL VERTEBRAL UNI L  MOD SED  02/14/2021   IR ANGIO VERTEBRAL SEL VERTEBRAL UNI L MOD SED  11/14/2021   IR CT HEAD LTD  04/24/2021   IR PTA INTRACRANIAL  04/24/2021   IR RADIOLOGIST EVAL & MGMT  09/28/2023   IR TRANSCATH/EMBOLIZ  04/24/2021   IR US  GUIDE VASC ACCESS RIGHT  02/14/2021   IR US  GUIDE VASC ACCESS RIGHT  04/24/2021   IR US  GUIDE VASC ACCESS RIGHT  11/14/2021   RADIOLOGY WITH ANESTHESIA N/A 04/24/2021   Procedure: EMBOLIZATION;  Surgeon: de Macedo Rodrigues, Katyucia, MD;  Location: [facility];  Service: Radiology;  Laterality: N/A;   UPPER GI ENDOSCOPY  08/28/2019    reports that she quit smoking about 9 years ago. Her smoking use included cigarettes. She has never used smokeless tobacco. She reports that she does not currently use alcohol. She reports that she does not use drugs. family history includes Breast cancer in her mother and sister; CAD in her father; Diabetes in her brother and mother; Hypertension in her sister; Migraines in her sister and sister. No Known Allergies  Review of Systems  Constitutional:  Negative for chills and fever.      Objective:     BP 124/70   Pulse 100   Temp 97.7 F (36.5 C) (  Oral)   Wt 156 lb 8 oz (71 kg)   SpO2 97%   BMI 29.57 kg/m  BP Readings from Last 3 Encounters:  01/14/24 124/70  01/13/24 (!) 144/75  04/07/23 120/84   Wt Readings from Last 3 Encounters:  01/14/24 156 lb 8 oz (71 kg)  04/07/23 162 lb 3.2 oz (73.6 kg)  01/15/23 160 lb 3.2 oz (72.7 kg)      Physical Exam Vitals reviewed.  Constitutional:      General: She is not in acute distress.    Appearance: She is not ill-appearing.  Cardiovascular:     Rate and Rhythm: Normal rate and regular rhythm.  Skin:    Comments: Left forearm reveals wound on the volar surface which has been loosely approximated with horizontal mattress suture.  She has some surrounding bruising but no erythema.  No warmth.  No purulent drainage.  Neurological:     Mental Status: She is alert.      No  results found for any visits on 01/14/24.    The 10-year ASCVD risk score (Arnett DK, et al., 2019) is: 10%    Assessment & Plan:   Dog bite involving left forearm.  This was neighbor's dog and dog has been fully vaccinated and is basically an indoor dog.  Patient relates she had tetanus within the past year at pharmacy that we do not have a record of this.  She has been intolerant of Augmentin  and reluctant to consider other antibiotics  -Confirm last tetanus - Reviewed wound care with cleaning daily with soap and water and keep covered - Follow-up immediately for any signs of secondary infection such as erythema, warmth, puslike drainage - She plans to follow-up with urgent care in about 10 to 12 days for suture removal   Wolm Scarlet, MD

## 2024-01-21 ENCOUNTER — Ambulatory Visit (INDEPENDENT_AMBULATORY_CARE_PROVIDER_SITE_OTHER): Admitting: Podiatry

## 2024-01-21 DIAGNOSIS — L600 Ingrowing nail: Secondary | ICD-10-CM

## 2024-01-21 NOTE — Progress Notes (Unsigned)
 Subjective: Kimberly Conway is a 68 y.o.  female returns to office today for follow up evaluation after having right Hallux Medial border nail avulsion performed. Patient has been soaking using epsom salt and applying topical antibiotic covered with bandaid daily. Patient denies fevers, chills, nausea, vomiting. Denies any calf pain, chest pain, SOB.   Objective:  Vitals: Reviewed  General: Well developed, nourished, in no acute distress, alert and oriented x3   Dermatology: Skin is warm, dry and supple bilateral. Medial hallux nail border appears to be clean, dry, with mild granular tissue and surrounding scab. There is no surrounding erythema, edema, drainage/purulence. The remaining nails appear unremarkable at this time. There are no other lesions or other signs of infection present.  Neurovascular status: Intact. No lower extremity swelling; No pain with calf compression bilateral.  Musculoskeletal: Decreased tenderness to palpation of the Medial hallux nail fold(s). Muscular strength within normal limits bilateral.   Assesement and Plan: S/p partial nail avulsion, doing well.   -disContinue soaking in epsom salts twice a day followed by antibiotic ointment and a band-aid as needed. Can leave uncovered at night. .  -If the area does not heal properply, call the office for follow-up appointment, or sooner if any problems arise.  -Monitor for any signs/symptoms of infection. Call the office immediately if any occur or go directly to the emergency room. Call with any questions/concerns.  Jaquanda Wickersham, DPM

## 2024-01-24 ENCOUNTER — Ambulatory Visit: Payer: Self-pay | Admitting: *Deleted

## 2024-01-24 NOTE — Telephone Encounter (Signed)
 FYI Only or Action Required?: FYI only for provider.  Patient was last seen in primary care on 01/14/2024 by Micheal Wolm ORN, MD.  Called Nurse Triage reporting Leg Pain.  Symptoms began several weeks ago.  Interventions attempted: OTC medications: ibuprofen not effective and Rest, hydration, or home remedies.  Symptoms are: gradually worsening.  Triage Disposition: See PCP When Office is Open (Within 3 Days)  Patient/caregiver understands and will follow disposition?: Yes              Copied from CRM #[id number]. Topic: Clinical - Red Word Triage >> Jan 24, 2024 10:42 AM Mesmerise C wrote: [facility] that prompted transfer to Nurse Triage: patient states she has an aching pain in left leg, had ingrown toe nail removal and it's affecting her toe all the way up her leg been ongoing for a couple of weeks, states it makes her queasy Reason for Disposition  [1] MODERATE pain (e.g., interferes with normal activities, limping) AND [2] present > 3 days  Answer Assessment - Initial Assessment Questions Appt scheduled tomorrow. Reports pain wakes her from sleep. Recent dog attack involving arm. Reports she can not take antibiotics due to makes me sick.     1. ONSET: When did the pain start?      Weeks  2. LOCATION: Where is the pain located?      Left leg  3. PAIN: How bad is the pain?    (Scale 1-10; or mild, moderate, severe)     Feels like something pulling on left leg, achy at night in bed 4/10  4. WORK OR EXERCISE: Has there been any recent work or exercise that involved this part of the body?      S/p ingrown toenail  5. CAUSE: What do you think is causing the leg pain?     Ingrown toenail  6. OTHER SYMPTOMS: Do you have any other symptoms? (e.g., chest pain, back pain, breathing difficulty, swelling, rash, fever, numbness, weakness)     Left leg pain , recent ingrown toenail ibuprofen not helping with pain. Aches at night no swelling . Feels quesy. 7.  PREGNANCY: Is there any chance you are pregnant? When was your last menstrual period?     na  Protocols used: Leg Pain-A-AH

## 2024-01-25 ENCOUNTER — Ambulatory Visit (INDEPENDENT_AMBULATORY_CARE_PROVIDER_SITE_OTHER): Admitting: Internal Medicine

## 2024-01-25 VITALS — BP 110/80 | HR 70 | Temp 98.6°F | Wt 155.6 lb

## 2024-01-25 DIAGNOSIS — M79605 Pain in left leg: Secondary | ICD-10-CM | POA: Diagnosis not present

## 2024-01-25 DIAGNOSIS — M79604 Pain in right leg: Secondary | ICD-10-CM

## 2024-01-25 DIAGNOSIS — I10 Essential (primary) hypertension: Secondary | ICD-10-CM

## 2024-01-25 DIAGNOSIS — E042 Nontoxic multinodular goiter: Secondary | ICD-10-CM

## 2024-01-25 MED ORDER — MELOXICAM 7.5 MG PO TABS: 7.5000 mg | ORAL_TABLET | Freq: Every day | ORAL | 0 refills | Status: AC

## 2024-01-25 NOTE — Progress Notes (Signed)
 Established Patient Office Visit     CC/Reason for Visit: Leg pain, follow-up dog bite, follow-up goiter  HPI: Kimberly Conway is a 68 y.o. female who is coming in today for the above mentioned reasons.  I have not seen her in some time.  She is here today to follow-up on 3 issues:  1.  She was recently bit by a dog, she was not able to take antibiotics due to GI illness.  She wants to make sure it is not infected.  2.  She recently had bilateral ingrown toenails removed by podiatrist.  Ever since then she feels like her gait is returning more to her normal and her lateral lower calf muscles are painful.  3.  She has a history of thyroid  nodules and wants to make sure she is not due for follow-up.   Past Medical/Surgical History: Past Medical History:  Diagnosis Date   Anginal pain (HCC)    Depression    Family history of adverse reaction to anesthesia    sister gets nauseous after anesthesia   GAD (generalized anxiety disorder)    GERD (gastroesophageal reflux disease)    Hepatitis B 1965   HTN (hypertension)    Multiple thyroid  nodules    Other atopic dermatitis 08/17/2020   Seasonal allergies    Urticaria    Vertigo     Past Surgical History:  Procedure Laterality Date   APPENDECTOMY     COLONOSCOPY  08/28/2019   DILATION AND CURETTAGE OF UTERUS     IR 3D INDEPENDENT WKST  04/24/2021   IR ANGIO EXTERNAL CAROTID SEL EXT CAROTID UNI R MOD SED  04/24/2021   IR ANGIO INTRA EXTRACRAN SEL COM CAROTID INNOMINATE BILAT MOD SED  11/14/2021   IR ANGIO INTRA EXTRACRAN SEL COM CAROTID INNOMINATE UNI R MOD SED  02/14/2021   IR ANGIO INTRA EXTRACRAN SEL INTERNAL CAROTID UNI L MOD SED  02/14/2021   IR ANGIO INTRA EXTRACRAN SEL INTERNAL CAROTID UNI R MOD SED  04/24/2021   IR ANGIO VERTEBRAL SEL SUBCLAVIAN INNOMINATE UNI R MOD SED  02/14/2021   IR ANGIO VERTEBRAL SEL VERTEBRAL UNI L MOD SED  02/14/2021   IR ANGIO VERTEBRAL SEL VERTEBRAL UNI L MOD SED  11/14/2021   IR CT HEAD LTD   04/24/2021   IR PTA INTRACRANIAL  04/24/2021   IR RADIOLOGIST EVAL & MGMT  09/28/2023   IR TRANSCATH/EMBOLIZ  04/24/2021   IR US  GUIDE VASC ACCESS RIGHT  02/14/2021   IR US  GUIDE VASC ACCESS RIGHT  04/24/2021   IR US  GUIDE VASC ACCESS RIGHT  11/14/2021   RADIOLOGY WITH ANESTHESIA N/A 04/24/2021   Procedure: EMBOLIZATION;  Surgeon: de Macedo Rodrigues, Katyucia, MD;  Location: [facility] OR;  Service: Radiology;  Laterality: N/A;   UPPER GI ENDOSCOPY  08/28/2019    Social History:  reports that she quit smoking about 9 years ago. Her smoking use included cigarettes. She has never used smokeless tobacco. She reports that she does not currently use alcohol. She reports that she does not use drugs.  Allergies: No Known Allergies  Family History:  Family History  Problem Relation Age of Onset   Diabetes Mother    Breast cancer Mother    CAD Father    Hypertension Sister    Breast cancer Sister    Migraines Sister    Migraines Sister    Diabetes Brother    Colon cancer Neg Hx    Esophageal cancer Neg Hx  Pancreatic cancer Neg Hx    Stomach cancer Neg Hx      Current Outpatient Medications:    aspirin  EC 81 MG tablet, Take 81 mg by mouth in the morning. Swallow whole., Disp: , Rfl:    BOOSTRIX 5-2.5-18.5 LF-MCG/0.5 injection, , Disp: , Rfl:    Calcium Citrate (CITRACAL PO), Take 3 tablets by mouth daily after lunch., Disp: , Rfl:    CAPVAXIVE 0.5 ML injection, , Disp: , Rfl:    desvenlafaxine  (PRISTIQ ) 25 MG 24 hr tablet, Take 1 tablet (25 mg total) by mouth daily., Disp: 90 tablet, Rfl: 0   fluconazole  (DIFLUCAN ) 150 MG tablet, Take 1 tablet by mouth once a week., Disp: , Rfl:    fluticasone  (FLONASE ) 50 MCG/ACT nasal spray, Place 2 sprays into both nostrils daily., Disp: 48 g, Rfl: 2   levocetirizine (XYZAL ) 5 MG tablet, Take 1 tablet (5 mg total) by mouth every evening., Disp: 90 tablet, Rfl: 1   LORazepam  (ATIVAN ) 0.5 MG tablet, Take 1 tablet (0.5 mg total) by mouth daily as needed.,  Disp: 30 tablet, Rfl: 1   meloxicam  (MOBIC ) 7.5 MG tablet, Take 1 tablet (7.5 mg total) by mouth daily., Disp: 30 tablet, Rfl: 0   Misc Natural Products (GLUCOSAMINE CHONDROITIN TRIPLE) TABS, Take 1 tablet by mouth daily., Disp: , Rfl:    Multiple Vitamin (MULTIVITAMIN) tablet, Take 1 tablet by mouth daily after lunch., Disp: , Rfl:    Omega-3 Fatty Acids (OMEGA III EPA+DHA) 1000 MG CAPS, Take 1 tablet by mouth daily., Disp: , Rfl:    omeprazole  (PRILOSEC) 20 MG capsule, TAKE 1 CAPSULE TWICE DAILY BEFORE MEALS, Disp: 180 capsule, Rfl: 1   Polyethyl Glycol-Propyl Glycol (LUBRICANT EYE DROPS) 0.4-0.3 % SOLN, Place 1-2 drops into both eyes 3 (three) times daily as needed (dry/irritated eyes)., Disp: , Rfl:    Probiotic Product (PROBIOTIC PO), Take 1 capsule by mouth in the morning., Disp: , Rfl:    sodium chloride  (OCEAN) 0.65 % SOLN nasal spray, Place 1 spray into both nostrils 2 (two) times daily., Disp: , Rfl:    Turmeric 500 MG CAPS, Take 500 mg by mouth daily after lunch., Disp: , Rfl:    estradiol (ESTRACE) 0.1 MG/GM vaginal cream, APPLY AS DIRECTED THREE TIMES A WEEK, Disp: , Rfl:    metoprolol  succinate (TOPROL -XL) 25 MG 24 hr tablet, Take 0.5 tablets by mouth., Disp: , Rfl:   Review of Systems:  Negative unless indicated in HPI.   Physical Exam: Vitals:   01/25/24 1333  BP: 110/80  Pulse: 70  Temp: 98.6 F (37 C)  TempSrc: Oral  SpO2: 96%  Weight: 155 lb 9.6 oz (70.6 kg)    Body mass index is 29.4 kg/m.   Physical Exam Vitals reviewed.  Constitutional:      Appearance: Normal appearance.  HENT:     Head: Normocephalic and atraumatic.  Eyes:     Conjunctiva/sclera: Conjunctivae normal.  Cardiovascular:     Rate and Rhythm: Normal rate and regular rhythm.  Pulmonary:     Effort: Pulmonary effort is normal.     Breath sounds: Normal breath sounds.  Skin:    General: Skin is warm and dry.  Neurological:     General: No focal deficit present.     Mental Status: She  is alert and oriented to person, place, and time.  Psychiatric:        Mood and Affect: Mood normal.        Behavior: Behavior normal.  Thought Content: Thought content normal.        Judgment: Judgment normal.      Impression and Plan:  Primary hypertension  Pain in both lower extremities -     Meloxicam ; Take 1 tablet (7.5 mg total) by mouth daily.  Dispense: 30 tablet; Refill: 0  Multiple thyroid  nodules   - Dog bite looks clean without signs of infection, has 1 stitch that is due to be removed later this week. - Upon review of most recent thyroid  ultrasound in 2021 no follow-up was recommended due to size and characteristics of nodules. - Blood pressure is well-controlled on previous. - Suspect lower calf pain is due to her using different muscles to walk now that her ingrown toenails have been removed, will give 10 days of meloxicam .  There is no swelling or signs of DVT.   Time spent:31 minutes reviewing chart, interviewing and examining patient and formulating plan of care.     Tully Theophilus Andrews, MD [facility]

## 2024-01-27 ENCOUNTER — Ambulatory Visit
Admission: RE | Admit: 2024-01-27 | Discharge: 2024-01-27 | Disposition: A | Discharge status: Home / Self Care | Source: Ambulatory Visit | Attending: Internal Medicine | Admitting: Internal Medicine

## 2024-01-27 VITALS — BP 122/81 | HR 63 | Temp 98.7°F | Resp 17

## 2024-01-27 DIAGNOSIS — Z4802 Encounter for removal of sutures: Secondary | ICD-10-CM | POA: Diagnosis not present

## 2024-01-27 NOTE — ED Provider Notes (Signed)
 [facility]  Note:  This document was prepared using Conservation officer, historic buildings and may include unintentional dictation errors.  MRN: [id number] DOB: [birth year]  Subjective:   Kimberly Conway is a 68 y.o. female presenting for encounter for suture removal.  Patient then for approximately 14 days.  No fever, drainage of pus or bleeding.  Patient was not able to tolerate the antibiotic Augmentin .  Kimberly Conway took 1 dose and has since attended to the wound very carefully at home.   No current facility-administered medications for this encounter.  Current Outpatient Medications:    aspirin  EC 81 MG tablet, Take 81 mg by mouth in the morning. Swallow whole., Disp: , Rfl:    BOOSTRIX 5-2.5-18.5 LF-MCG/0.5 injection, , Disp: , Rfl:    Calcium Citrate (CITRACAL PO), Take 3 tablets by mouth daily after lunch., Disp: , Rfl:    CAPVAXIVE 0.5 ML injection, , Disp: , Rfl:    desvenlafaxine  (PRISTIQ ) 25 MG 24 hr tablet, Take 1 tablet (25 mg total) by mouth daily., Disp: 90 tablet, Rfl: 0   estradiol (ESTRACE) 0.1 MG/GM vaginal cream, APPLY AS DIRECTED THREE TIMES A WEEK, Disp: , Rfl:    fluconazole  (DIFLUCAN ) 150 MG tablet, Take 1 tablet by mouth once a week., Disp: , Rfl:    fluticasone  (FLONASE ) 50 MCG/ACT nasal spray, Place 2 sprays into both nostrils daily., Disp: 48 g, Rfl: 2   levocetirizine (XYZAL ) 5 MG tablet, Take 1 tablet (5 mg total) by mouth every evening., Disp: 90 tablet, Rfl: 1   LORazepam  (ATIVAN ) 0.5 MG tablet, Take 1 tablet (0.5 mg total) by mouth daily as needed., Disp: 30 tablet, Rfl: 1   meloxicam  (MOBIC ) 7.5 MG tablet, Take 1 tablet (7.5 mg total) by mouth daily., Disp: 30 tablet, Rfl: 0   metoprolol  succinate (TOPROL -XL) 25 MG 24 hr tablet, Take 0.5 tablets by mouth., Disp: , Rfl:    Misc Natural Products (GLUCOSAMINE CHONDROITIN TRIPLE) TABS, Take 1 tablet by mouth daily., Disp: , Rfl:    Multiple Vitamin (MULTIVITAMIN) tablet, Take 1 tablet by mouth daily  after lunch., Disp: , Rfl:    Omega-3 Fatty Acids (OMEGA III EPA+DHA) 1000 MG CAPS, Take 1 tablet by mouth daily., Disp: , Rfl:    omeprazole  (PRILOSEC) 20 MG capsule, TAKE 1 CAPSULE TWICE DAILY BEFORE MEALS, Disp: 180 capsule, Rfl: 1   Polyethyl Glycol-Propyl Glycol (LUBRICANT EYE DROPS) 0.4-0.3 % SOLN, Place 1-2 drops into both eyes 3 (three) times daily as needed (dry/irritated eyes)., Disp: , Rfl:    Probiotic Product (PROBIOTIC PO), Take 1 capsule by mouth in the morning., Disp: , Rfl:    sodium chloride  (OCEAN) 0.65 % SOLN nasal spray, Place 1 spray into both nostrils 2 (two) times daily., Disp: , Rfl:    Turmeric 500 MG CAPS, Take 500 mg by mouth daily after lunch., Disp: , Rfl:    No Known Allergies  Past Medical History:  Diagnosis Date   Anginal pain (HCC)    Depression    Family history of adverse reaction to anesthesia    sister gets nauseous after anesthesia   GAD (generalized anxiety disorder)    GERD (gastroesophageal reflux disease)    Hepatitis B 1965   HTN (hypertension)    Multiple thyroid  nodules    Other atopic dermatitis 08/17/2020   Seasonal allergies    Urticaria    Vertigo      Past Surgical History:  Procedure Laterality Date   APPENDECTOMY  COLONOSCOPY  08/28/2019   DILATION AND CURETTAGE OF UTERUS     IR 3D INDEPENDENT WKST  04/24/2021   IR ANGIO EXTERNAL CAROTID SEL EXT CAROTID UNI R MOD SED  04/24/2021   IR ANGIO INTRA EXTRACRAN SEL COM CAROTID INNOMINATE BILAT MOD SED  11/14/2021   IR ANGIO INTRA EXTRACRAN SEL COM CAROTID INNOMINATE UNI R MOD SED  02/14/2021   IR ANGIO INTRA EXTRACRAN SEL INTERNAL CAROTID UNI L MOD SED  02/14/2021   IR ANGIO INTRA EXTRACRAN SEL INTERNAL CAROTID UNI R MOD SED  04/24/2021   IR ANGIO VERTEBRAL SEL SUBCLAVIAN INNOMINATE UNI R MOD SED  02/14/2021   IR ANGIO VERTEBRAL SEL VERTEBRAL UNI L MOD SED  02/14/2021   IR ANGIO VERTEBRAL SEL VERTEBRAL UNI L MOD SED  11/14/2021   IR CT HEAD LTD  04/24/2021   IR PTA INTRACRANIAL   04/24/2021   IR RADIOLOGIST EVAL & MGMT  09/28/2023   IR TRANSCATH/EMBOLIZ  04/24/2021   IR US  GUIDE VASC ACCESS RIGHT  02/14/2021   IR US  GUIDE VASC ACCESS RIGHT  04/24/2021   IR US  GUIDE VASC ACCESS RIGHT  11/14/2021   RADIOLOGY WITH ANESTHESIA N/A 04/24/2021   Procedure: EMBOLIZATION;  Surgeon: de Macedo Rodrigues, Katyucia, MD;  Location: [facility] OR;  Service: Radiology;  Laterality: N/A;   UPPER GI ENDOSCOPY  08/28/2019    Family History  Problem Relation Age of Onset   Diabetes Mother    Breast cancer Mother    CAD Father    Hypertension Sister    Breast cancer Sister    Migraines Sister    Migraines Sister    Diabetes Brother    Colon cancer Neg Hx    Esophageal cancer Neg Hx    Pancreatic cancer Neg Hx    Stomach cancer Neg Hx     Social History   Tobacco Use   Smoking status: Former    Current packs/day: 0.00    Types: Cigarettes    Quit date: 06/21/2014    Years since quitting: 9.6   Smokeless tobacco: Never  Vaping Use   Vaping status: Never Used  Substance Use Topics   Alcohol use: Not Currently    Comment: Quit in 2016   Drug use: Never    ROS   Objective:   Vitals: BP 122/81 (BP Location: Right Arm)   Pulse 63   Temp 98.7 F (37.1 C) (Oral)   Resp 17   SpO2 98%   Physical Exam Constitutional:      General: Kimberly Conway is not in acute distress.    Appearance: Normal appearance. Kimberly Conway is well-developed. Kimberly Conway is not ill-appearing, toxic-appearing or diaphoretic.  HENT:     Head: Normocephalic and atraumatic.     Nose: Nose normal.     Mouth/Throat:     Mouth: Mucous membranes are moist.  Eyes:     General: No scleral icterus.       Right eye: No discharge.        Left eye: No discharge.     Extraocular Movements: Extraocular movements intact.  Cardiovascular:     Rate and Rhythm: Normal rate.  Pulmonary:     Effort: Pulmonary effort is normal.  Musculoskeletal:       Arms:  Skin:    General: Skin is warm and dry.  Neurological:     General: No  focal deficit present.     Mental Status: Kimberly Conway is alert and oriented to person, place, and time.  Psychiatric:  Mood and Affect: Mood normal.        Behavior: Behavior normal.    Assessment and Plan :   PDMP not reviewed this encounter.  1. Encounter for removal of sutures    1 horizontal mattress suture removed without incident.  Steri-Strips applied to the wound.  Anticipatory guidance provided.   Christopher Savannah, [place] 01/27/24 1334

## 2024-01-27 NOTE — ED Triage Notes (Addendum)
 Pt for suture removal to LUE-NAD-steady gait-pt requested Mani to remove suture

## 2024-02-10 ENCOUNTER — Other Ambulatory Visit: Payer: Self-pay

## 2024-02-11 DIAGNOSIS — H35373 Puckering of macula, bilateral: Secondary | ICD-10-CM | POA: Diagnosis not present

## 2024-02-11 DIAGNOSIS — H52223 Regular astigmatism, bilateral: Secondary | ICD-10-CM | POA: Diagnosis not present

## 2024-02-11 DIAGNOSIS — H2513 Age-related nuclear cataract, bilateral: Secondary | ICD-10-CM | POA: Diagnosis not present

## 2024-02-11 DIAGNOSIS — H5213 Myopia, bilateral: Secondary | ICD-10-CM | POA: Diagnosis not present

## 2024-02-11 DIAGNOSIS — H524 Presbyopia: Secondary | ICD-10-CM | POA: Diagnosis not present

## 2024-02-13 ENCOUNTER — Encounter: Payer: Self-pay | Admitting: Internal Medicine

## 2024-02-17 ENCOUNTER — Ambulatory Visit (INDEPENDENT_AMBULATORY_CARE_PROVIDER_SITE_OTHER): Admitting: Internal Medicine

## 2024-02-17 ENCOUNTER — Encounter: Payer: Self-pay | Admitting: Internal Medicine

## 2024-02-17 VITALS — BP 120/80 | HR 65 | Temp 98.2°F | Wt 157.7 lb

## 2024-02-17 DIAGNOSIS — Z23 Encounter for immunization: Secondary | ICD-10-CM | POA: Diagnosis not present

## 2024-02-17 DIAGNOSIS — M79662 Pain in left lower leg: Secondary | ICD-10-CM | POA: Diagnosis not present

## 2024-02-17 NOTE — Progress Notes (Signed)
 Established Patient Office Visit     CC/Reason for Visit: Continued left flank pain  HPI: Kimberly Conway is a 68 y.o. female who is coming in today for the above mentioned reasons.  She was seen 10 days ago for the same reason.  She ended up having bilateral ingrown toenails removed and likely because of an antalgic gait has developed pain around her hip and calf area.  She has no swelling that makes me concerned for DVT.  It has persisted despite a course of meloxicam .   Past Medical/Surgical History: Past Medical History:  Diagnosis Date   Anginal pain (HCC)    Depression    Family history of adverse reaction to anesthesia    sister gets nauseous after anesthesia   GAD (generalized anxiety disorder)    GERD (gastroesophageal reflux disease)    Hepatitis B 1965   HTN (hypertension)    Multiple thyroid  nodules    Other atopic dermatitis 08/17/2020   Seasonal allergies    Urticaria    Vertigo     Past Surgical History:  Procedure Laterality Date   APPENDECTOMY     COLONOSCOPY  08/28/2019   DILATION AND CURETTAGE OF UTERUS     IR 3D INDEPENDENT WKST  04/24/2021   IR ANGIO EXTERNAL CAROTID SEL EXT CAROTID UNI R MOD SED  04/24/2021   IR ANGIO INTRA EXTRACRAN SEL COM CAROTID INNOMINATE BILAT MOD SED  11/14/2021   IR ANGIO INTRA EXTRACRAN SEL COM CAROTID INNOMINATE UNI R MOD SED  02/14/2021   IR ANGIO INTRA EXTRACRAN SEL INTERNAL CAROTID UNI L MOD SED  02/14/2021   IR ANGIO INTRA EXTRACRAN SEL INTERNAL CAROTID UNI R MOD SED  04/24/2021   IR ANGIO VERTEBRAL SEL SUBCLAVIAN INNOMINATE UNI R MOD SED  02/14/2021   IR ANGIO VERTEBRAL SEL VERTEBRAL UNI L MOD SED  02/14/2021   IR ANGIO VERTEBRAL SEL VERTEBRAL UNI L MOD SED  11/14/2021   IR CT HEAD LTD  04/24/2021   IR PTA INTRACRANIAL  04/24/2021   IR RADIOLOGIST EVAL & MGMT  09/28/2023   IR TRANSCATH/EMBOLIZ  04/24/2021   IR US  GUIDE VASC ACCESS RIGHT  02/14/2021   IR US  GUIDE VASC ACCESS RIGHT  04/24/2021   IR US  GUIDE VASC ACCESS  RIGHT  11/14/2021   RADIOLOGY WITH ANESTHESIA N/A 04/24/2021   Procedure: EMBOLIZATION;  Surgeon: de Macedo Rodrigues, Katyucia, MD;  Location: [facility] OR;  Service: Radiology;  Laterality: N/A;   UPPER GI ENDOSCOPY  08/28/2019    Social History:  reports that she quit smoking about 9 years ago. Her smoking use included cigarettes. She has never used smokeless tobacco. She reports that she does not currently use alcohol. She reports that she does not use drugs.  Allergies: No Known Allergies  Family History:  Family History  Problem Relation Age of Onset   Diabetes Mother    Breast cancer Mother    CAD Father    Hypertension Sister    Breast cancer Sister    Migraines Sister    Migraines Sister    Diabetes Brother    Colon cancer Neg Hx    Esophageal cancer Neg Hx    Pancreatic cancer Neg Hx    Stomach cancer Neg Hx      Current Outpatient Medications:    aspirin  EC 81 MG tablet, Take 81 mg by mouth in the morning. Swallow whole., Disp: , Rfl:    BOOSTRIX 5-2.5-18.5 LF-MCG/0.5 injection, , Disp: , Rfl:  Calcium Citrate (CITRACAL PO), Take 3 tablets by mouth daily after lunch., Disp: , Rfl:    CAPVAXIVE 0.5 ML injection, , Disp: , Rfl:    desvenlafaxine  (PRISTIQ ) 25 MG 24 hr tablet, Take 1 tablet (25 mg total) by mouth daily., Disp: 90 tablet, Rfl: 0   fluconazole  (DIFLUCAN ) 150 MG tablet, Take 1 tablet by mouth once a week., Disp: , Rfl:    fluticasone  (FLONASE ) 50 MCG/ACT nasal spray, Place 2 sprays into both nostrils daily., Disp: 48 g, Rfl: 2   levocetirizine (XYZAL ) 5 MG tablet, Take 1 tablet (5 mg total) by mouth every evening., Disp: 90 tablet, Rfl: 1   LORazepam  (ATIVAN ) 0.5 MG tablet, Take 1 tablet (0.5 mg total) by mouth daily as needed., Disp: 30 tablet, Rfl: 1   meloxicam  (MOBIC ) 7.5 MG tablet, Take 1 tablet (7.5 mg total) by mouth daily., Disp: 30 tablet, Rfl: 0   Misc Natural Products (GLUCOSAMINE CHONDROITIN TRIPLE) TABS, Take 1 tablet by mouth daily., Disp: , Rfl:     Multiple Vitamin (MULTIVITAMIN) tablet, Take 1 tablet by mouth daily after lunch., Disp: , Rfl:    Omega-3 Fatty Acids (OMEGA III EPA+DHA) 1000 MG CAPS, Take 1 tablet by mouth daily., Disp: , Rfl:    omeprazole  (PRILOSEC) 20 MG capsule, TAKE 1 CAPSULE TWICE DAILY BEFORE MEALS, Disp: 180 capsule, Rfl: 1   Polyethyl Glycol-Propyl Glycol (LUBRICANT EYE DROPS) 0.4-0.3 % SOLN, Place 1-2 drops into both eyes 3 (three) times daily as needed (dry/irritated eyes)., Disp: , Rfl:    Probiotic Product (PROBIOTIC PO), Take 1 capsule by mouth in the morning., Disp: , Rfl:    sodium chloride  (OCEAN) 0.65 % SOLN nasal spray, Place 1 spray into both nostrils 2 (two) times daily., Disp: , Rfl:    Turmeric 500 MG CAPS, Take 500 mg by mouth daily after lunch., Disp: , Rfl:    estradiol (ESTRACE) 0.1 MG/GM vaginal cream, APPLY AS DIRECTED THREE TIMES A WEEK, Disp: , Rfl:    metoprolol  succinate (TOPROL -XL) 25 MG 24 hr tablet, Take 0.5 tablets by mouth., Disp: , Rfl:   Review of Systems:  Negative unless indicated in HPI.   Physical Exam: Vitals:   02/17/24 1024  BP: 120/80  Pulse: 65  Temp: 98.2 F (36.8 C)  TempSrc: Oral  SpO2: 99%  Weight: 157 lb 11.2 oz (71.5 kg)    Body mass index is 29.8 kg/m.     Impression and Plan:  Pain of left calf -     Ambulatory referral to Sports Medicine   - Placed referral to sports medicine for further evaluation and management.  Time spent:22 minutes reviewing chart, interviewing and examining patient and formulating plan of care.     Tully Theophilus Andrews, MD [facility]

## 2024-02-17 NOTE — Progress Notes (Signed)
63

## 2024-03-02 ENCOUNTER — Ambulatory Visit: Admitting: Sports Medicine

## 2024-03-02 VITALS — BP 110/68 | HR 66 | Ht 61.0 in | Wt 156.0 lb

## 2024-03-02 DIAGNOSIS — M79605 Pain in left leg: Secondary | ICD-10-CM

## 2024-03-02 MED ORDER — CELECOXIB 200 MG PO CAPS: 200.0000 mg | ORAL_CAPSULE | Freq: Two times a day (BID) | ORAL | 0 refills | Status: AC

## 2024-03-02 NOTE — Patient Instructions (Addendum)
 Leg HEP   - Start Celebrex  200 mg daily x2 daily for 2 weeks.  If still having pain after 2 weeks, complete 3rd-week of NSAID. May use remaining NSAID as needed once daily for pain control.  Do not to use additional over-the-counter NSAIDs (ibuprofen, naproxen, Advil, Aleve, etc.) while taking prescription NSAIDs.  May use Tylenol  [phone] mg 2 to 3 times a day for breakthrough pain.  PT referral   6-8 week follow up

## 2024-03-02 NOTE — Progress Notes (Signed)
 [name] [address] [place] [address] Phone: [phone]   Assessment and Plan:     1. Left leg pain (Primary) -Subacute, initial visit - Left leg pain, primarily in left foot, but additionally radiating through shin, thigh.  Most consistent with altered gait from ingrown toenail leading to muscular dysfunction.  Differential includes lumbar radiculopathy, sciatica however these are less likely based on physical exam - Start Celebrex  200 mg twice daily x2 weeks.  If still having pain after 2 weeks, complete 3rd-week of NSAID. May use remaining NSAID as needed once daily for pain control.  Do not to use additional over-the-counter NSAIDs (ibuprofen, naproxen, Advil, Aleve, etc.) while taking prescription NSAIDs.  May use Tylenol  [phone] mg 2 to 3 times a day for breakthrough pain.  -Start HEP and physical therapy for ankle, knee, hip  15 additional minutes spent for educating Therapeutic Home Exercise Program.  This included exercises focusing on stretching, strengthening, with focus on eccentric aspects.   Long term goals include an improvement in range of motion, strength, endurance as well as avoiding reinjury. Patient's frequency would include in 1-2 times a day, 3-5 times a week for a duration of 6-12 weeks. Proper technique shown and discussed handout in great detail with ATC.  All questions were discussed and answered.    Pertinent previous records reviewed include none   Follow Up: 6 to 8 weeks for reevaluation.  Could consider x-ray imaging versus ultrasound to further distinguish muscular dysfunction versus radiculopathy   Subjective:   I, Kimberly Conway, am serving as a Neurosurgeon for Kimberly Conway  Chief Complaint: left leg pain   HPI:   03/02/24 Patient is a 68 year old female with left leg pain. Patient states was seen by podiatry for ingrown toe nail. When she had the ingrown removed she developed leg  pain that starts at the toe and to the hip. Pain started in August. Muscle relaxer's do not help. Icy hot doesn't help. Biofreeze seems to help. No MOI. Intermittent pain. She feels better when she does exercise. Her pain is flared at night but not all the time. Tylenol  seems to help when it wakes her in the night    Relevant Historical Information: Hypertension, GERD  Additional pertinent review of systems negative.   Current Outpatient Medications:    celecoxib  (CELEBREX ) 200 MG capsule, Take 1 capsule (200 mg total) by mouth 2 (two) times daily., Disp: 60 capsule, Rfl: 0   aspirin  EC 81 MG tablet, Take 81 mg by mouth in the morning. Swallow whole., Disp: , Rfl:    BOOSTRIX 5-2.5-18.5 LF-MCG/0.5 injection, , Disp: , Rfl:    Calcium Citrate (CITRACAL PO), Take 3 tablets by mouth daily after lunch., Disp: , Rfl:    CAPVAXIVE 0.5 ML injection, , Disp: , Rfl:    desvenlafaxine  (PRISTIQ ) 25 MG 24 hr tablet, Take 1 tablet (25 mg total) by mouth daily., Disp: 90 tablet, Rfl: 0   fluconazole  (DIFLUCAN ) 150 MG tablet, Take 1 tablet by mouth once a week., Disp: , Rfl:    fluticasone  (FLONASE ) 50 MCG/ACT nasal spray, Place 2 sprays into both nostrils daily., Disp: 48 g, Rfl: 2   levocetirizine (XYZAL ) 5 MG tablet, Take 1 tablet (5 mg total) by mouth every evening., Disp: 90 tablet, Rfl: 1   LORazepam  (ATIVAN ) 0.5 MG tablet, Take 1 tablet (0.5 mg total) by mouth daily as needed., Disp: 30 tablet, Rfl: 1   meloxicam  (  MOBIC ) 7.5 MG tablet, Take 1 tablet (7.5 mg total) by mouth daily., Disp: 30 tablet, Rfl: 0   Misc Natural Products (GLUCOSAMINE CHONDROITIN TRIPLE) TABS, Take 1 tablet by mouth daily., Disp: , Rfl:    Multiple Vitamin (MULTIVITAMIN) tablet, Take 1 tablet by mouth daily after lunch., Disp: , Rfl:    Omega-3 Fatty Acids (OMEGA III EPA+DHA) 1000 MG CAPS, Take 1 tablet by mouth daily., Disp: , Rfl:    omeprazole  (PRILOSEC) 20 MG capsule, TAKE 1 CAPSULE TWICE DAILY BEFORE MEALS, Disp: 180  capsule, Rfl: 1   Polyethyl Glycol-Propyl Glycol (LUBRICANT EYE DROPS) 0.4-0.3 % SOLN, Place 1-2 drops into both eyes 3 (three) times daily as needed (dry/irritated eyes)., Disp: , Rfl:    Probiotic Product (PROBIOTIC PO), Take 1 capsule by mouth in the morning., Disp: , Rfl:    sodium chloride  (OCEAN) 0.65 % SOLN nasal spray, Place 1 spray into both nostrils 2 (two) times daily., Disp: , Rfl:    Turmeric 500 MG CAPS, Take 500 mg by mouth daily after lunch., Disp: , Rfl:    Objective:     Vitals:   03/02/24 0749  BP: 110/68  Pulse: 66  SpO2: 97%  Weight: 156 lb (70.8 kg)  Height: 5' 1 (1.549 m)      Body mass index is 29.48 kg/m.    Physical Exam:    General: awake, alert, and oriented no acute distress, nontoxic Skin: no suspicious lesions or rashes Neuro:sensation intact distally with no deficits, normal muscle tone, no atrophy, strength 5/5 in all tested lower ext groups Psych: normal mood and affect, speech clear   Left leg: No deformity, swelling or wasting Hip ROM Flexion 90, ext 30, IR 45, ER 45 TTP mildly quadriceps, gastroc NTTP over the hip flexors, greater trochanter, gluteal musculature, si joint, lumbar spine Negative straight leg raise Negative log roll with FROM Negative FABER Negative FADIR Negative Piriformis test Negative trendelenberg Gait normal  Discomfort with resisted ankle dorsiflexion, knee flexion, hip flexion  Electronically signed by:  [name] 8:11 AM 03/02/24

## 2024-03-15 ENCOUNTER — Emergency Department (HOSPITAL_COMMUNITY)

## 2024-03-15 ENCOUNTER — Ambulatory Visit: Payer: Self-pay

## 2024-03-15 ENCOUNTER — Emergency Department (HOSPITAL_COMMUNITY)
Admission: EM | Admit: 2024-03-15 | Discharge: 2024-03-15 | Disposition: A | Discharge status: Home / Self Care | Attending: Emergency Medicine | Admitting: Emergency Medicine

## 2024-03-15 ENCOUNTER — Other Ambulatory Visit: Payer: Self-pay

## 2024-03-15 ENCOUNTER — Encounter (HOSPITAL_COMMUNITY): Payer: Self-pay

## 2024-03-15 DIAGNOSIS — F419 Anxiety disorder, unspecified: Secondary | ICD-10-CM | POA: Insufficient documentation

## 2024-03-15 DIAGNOSIS — Z7982 Long term (current) use of aspirin: Secondary | ICD-10-CM | POA: Insufficient documentation

## 2024-03-15 DIAGNOSIS — F418 Other specified anxiety disorders: Secondary | ICD-10-CM | POA: Diagnosis not present

## 2024-03-15 DIAGNOSIS — R0789 Other chest pain: Secondary | ICD-10-CM | POA: Diagnosis not present

## 2024-03-15 DIAGNOSIS — I10 Essential (primary) hypertension: Secondary | ICD-10-CM | POA: Diagnosis not present

## 2024-03-15 DIAGNOSIS — F32A Depression, unspecified: Secondary | ICD-10-CM | POA: Diagnosis not present

## 2024-03-15 DIAGNOSIS — Z79899 Other long term (current) drug therapy: Secondary | ICD-10-CM | POA: Diagnosis not present

## 2024-03-15 DIAGNOSIS — R0600 Dyspnea, unspecified: Secondary | ICD-10-CM | POA: Diagnosis not present

## 2024-03-15 LAB — CBC WITH DIFFERENTIAL/PLATELET
Abs Immature Granulocytes: 0.01 K/uL (ref 0.00–0.07)
Basophils Absolute: 0.1 K/uL (ref 0.0–0.1)
Basophils Relative: 1 %
Eosinophils Absolute: 0.1 K/uL (ref 0.0–0.5)
Eosinophils Relative: 2 %
HCT: 48.7 % — ABNORMAL HIGH (ref 36.0–46.0)
Hemoglobin: 16.1 g/dL — ABNORMAL HIGH (ref 12.0–15.0)
Immature Granulocytes: 0 %
Lymphocytes Relative: 26 %
Lymphs Abs: 1.5 K/uL (ref 0.7–4.0)
MCH: 31.4 pg (ref 26.0–34.0)
MCHC: 33.1 g/dL (ref 30.0–36.0)
MCV: 94.9 fL (ref 80.0–100.0)
Monocytes Absolute: 0.3 K/uL (ref 0.1–1.0)
Monocytes Relative: 5 %
Neutro Abs: 3.8 K/uL (ref 1.7–7.7)
Neutrophils Relative %: 66 %
Platelets: 311 K/uL (ref 150–400)
RBC: 5.13 MIL/uL — ABNORMAL HIGH (ref 3.87–5.11)
RDW: 12.8 % (ref 11.5–15.5)
WBC: 5.7 K/uL (ref 4.0–10.5)
nRBC: 0 % (ref 0.0–0.2)

## 2024-03-15 LAB — BASIC METABOLIC PANEL WITH GFR
Anion gap: 12 (ref 5–15)
BUN: 15 mg/dL (ref 8–23)
CO2: 25 mmol/L (ref 22–32)
Calcium: 10.4 mg/dL — ABNORMAL HIGH (ref 8.9–10.3)
Chloride: 103 mmol/L (ref 98–111)
Creatinine, Ser: 0.82 mg/dL (ref 0.44–1.00)
GFR, Estimated: >60 mL/min (ref >60)
Glucose, Bld: 105 mg/dL — ABNORMAL HIGH (ref 70–99)
Potassium: 4.3 mmol/L (ref 3.5–5.1)
Sodium: 140 mmol/L (ref 135–145)

## 2024-03-15 LAB — TROPONIN T, HIGH SENSITIVITY: Troponin T High Sensitivity: <15 ng/L (ref 0–19)

## 2024-03-15 MED ORDER — LORAZEPAM 0.5 MG PO TABS
0.5000 mg | ORAL_TABLET | Freq: Once | ORAL | Status: AC
  Administered 2024-03-15: 0.5 mg via ORAL
  Filled 2024-03-15: qty 1

## 2024-03-15 NOTE — Discharge Instructions (Addendum)
 You were seen today for anxiety which was likely causing your tingling sensations you are noticing your hands and around your mouth.  Your lab work today was very reassuring that low suspicion for any emergent causes of your symptoms today.  Recommend you continue to take your anxiety medications as well as following up with your psychiatrist and PCP for further long-term management.

## 2024-03-15 NOTE — Telephone Encounter (Signed)
 FYI Only or Action Required?: FYI only for provider. Patient heading to UC- refused ED  Patient was last seen in primary care on 02/17/2024 by Theophilus Andrews, Tully GRADE, MD.  Called Nurse Triage reporting Numbness.  Symptoms began today.  Interventions attempted: Nothing.  Symptoms are: gradually worsening.  Triage Disposition: See HCP Within 4 Hours (Or PCP Triage)  Patient/caregiver understands and will follow disposition?: Yes  Copied from CRM #[id number]. Topic: Clinical - Red Word Triage >> Mar 15, 2024 10:19 AM [facility] ORN wrote: Red Word that prompted transfer to Nurse Triage: anxious, numbness on lips, nausea. Reason for Disposition  [1] Tingling (e.g., pins and needles) of the face, arm / hand, or leg / foot on one side of the body AND [2] present now  (Exceptions: Chronic/recurrent symptom lasting > 4 weeks; or from known cause, such as: bumped elbow, carpal tunnel, pinched nerve.)  Answer Assessment - Initial Assessment Questions Nausea, lips feel numb, anxiety  A lot of increased stress  Patient denies any other neurological symptoms besides lip numbness. Denies SOB, CP, Dizziness, Double vision or blurry vision. She is steady on her feet. She is very anxious so she is unable to tell if she is having palpitations. Something just does not feel right. Unable to find an appointment for today. Pt sister on her way to escort pt to the closest UC. She refused ED stating they make you wait so long it is a joke. Pt apologized for cutting call short and disconnected.   1. SYMPTOM: What is the main symptom you are concerned about? (e.g., weakness, numbness)     Lip numbness, nausea 2. ONSET: When did this start? (e.g., minutes, hours, days; while sleeping)     Woke this morning feeling like this 3. LAST NORMAL: When was the last time you (the patient) were normal (no symptoms)?     Before bed 4. PATTERN Does this come and go, or has it been constant since it started?  Is it present  now?     Constant  5. CARDIAC SYMPTOMS: Have you had any of the following symptoms: chest pain, difficulty breathing, palpitations?     Just anxiety  6. NEUROLOGIC SYMPTOMS: Have you had any of the following symptoms: headache, dizziness, vision loss, double vision, changes in speech, unsteady on your feet?     denies 7. OTHER SYMPTOMS: Do you have any other symptoms?     Something doesn't feel right and she is scared.  Protocols used: Neurologic Deficit-A-AH

## 2024-03-15 NOTE — ED Provider Notes (Signed)
 [place] EMERGENCY DEPARTMENT AT [facility] Provider Note   CSN: [id number] Arrival date & time: 03/15/24  1140     Patient presents with: Anxiety   Kimberly Conway is a 68 y.o. female.    Anxiety  Patient is a 68 year old female presenting to the emergency department for multiple complaints complaining of increased anxiety, depression, chest pressure, lightheadedness, nausea, bilateral hand and finger tingling, perioral tingling.  Noted that she had an episode last night which resolved with her lorazepam .  Presenting to the ED today due to recurrence upon her waking up and has been consistent since then.  This that she has been going through extremely stressful situations recently.  Also reported that she has been tapering off her Pristiq  medication due to her wishing to come off of all medications.  Noticing increased symptoms since starting to taper off.  Previous medical history of Depression, anxiety, dermatitis, GERD, HTN, hepatitis B, brain aneurysm  Denies headache, blurry vision, vertigo, dysphagia, dyne aphasia, cough, congestion, chest pain, shortness of breath, abdominal pain, vomiting, diarrhea, hematochezia, melena, dysuria, vaginal discharge, vaginal bleeding, lower leg swelling.  Denies SI, HI, hallucinations     Prior to Admission medications   Medication Sig Start Date End Date Taking? Authorizing Provider  aspirin  EC 81 MG tablet Take 81 mg by mouth in the morning. Swallow whole.    [provider]  BOOSTRIX 5-2.5-18.5 LF-MCG/0.5 injection  07/16/23   [provider]  Calcium Citrate (CITRACAL PO) Take 3 tablets by mouth daily after lunch.    [provider]  CAPVAXIVE 0.5 ML injection  09/03/23   [provider]  celecoxib  (CELEBREX ) 200 MG capsule Take 1 capsule (200 mg total) by mouth 2 (two) times daily. 03/02/24   Leonce Katz, DO  desvenlafaxine  (PRISTIQ ) 25 MG 24 hr tablet Take 1 tablet (25 mg total) by  mouth daily. 12/13/23   Mozingo, Regina Nattalie, NP  fluconazole  (DIFLUCAN ) 150 MG tablet Take 1 tablet by mouth once a week.    [provider]  fluticasone  (FLONASE ) 50 MCG/ACT nasal spray Place 2 sprays into both nostrils daily. 05/13/23   Theophilus Andrews, Tully GRADE, MD  levocetirizine (XYZAL ) 5 MG tablet Take 1 tablet (5 mg total) by mouth every evening. 07/06/23   Theophilus Andrews, Tully GRADE, MD  LORazepam  (ATIVAN ) 0.5 MG tablet Take 1 tablet (0.5 mg total) by mouth daily as needed. 12/30/23   Mozingo, Regina Nattalie, NP  meloxicam  (MOBIC ) 7.5 MG tablet Take 1 tablet (7.5 mg total) by mouth daily. 01/25/24   Theophilus Andrews, Tully GRADE, MD  Misc Natural Products (GLUCOSAMINE CHONDROITIN TRIPLE) TABS Take 1 tablet by mouth daily.    [provider]  Multiple Vitamin (MULTIVITAMIN) tablet Take 1 tablet by mouth daily after lunch.    [provider]  Omega-3 Fatty Acids (OMEGA III EPA+DHA) 1000 MG CAPS Take 1 tablet by mouth daily. 02/23/19   [provider]  omeprazole  (PRILOSEC) 20 MG capsule TAKE 1 CAPSULE TWICE DAILY BEFORE MEALS 05/24/23   Theophilus Andrews, Tully GRADE, MD  Polyethyl Glycol-Propyl Glycol (LUBRICANT EYE DROPS) 0.4-0.3 % SOLN Place 1-2 drops into both eyes 3 (three) times daily as needed (dry/irritated eyes).    [provider]  Probiotic Product (PROBIOTIC PO) Take 1 capsule by mouth in the morning.    [provider]  sodium chloride  (OCEAN) 0.65 % SOLN nasal spray Place 1 spray into both nostrils 2 (two) times daily.    [provider]  Turmeric 500 MG CAPS Take 500 mg by mouth daily after lunch.    [provider]    Allergies: Patient has no known allergies.    Review of Systems  Neurological:  Positive for light-headedness.  Psychiatric/Behavioral:  The patient is nervous/anxious.   All other systems reviewed and are negative.   Updated Vital Signs BP (!) 142/79 (BP Location: Right Arm)   Pulse 64    Temp 97.7 F (36.5 C) (Oral)   Resp 20   SpO2 99%   Physical Exam Vitals and nursing note reviewed.  Constitutional:      General: She is not in acute distress.    Appearance: Normal appearance. She is not ill-appearing or diaphoretic.  HENT:     Head: Normocephalic and atraumatic.     Right Ear: Tympanic membrane, ear canal and external ear normal.     Left Ear: Tympanic membrane, ear canal and external ear normal.     Mouth/Throat:     Mouth: Mucous membranes are moist.     Pharynx: Oropharynx is clear.  Eyes:     General: No scleral icterus.       Right eye: No discharge.        Left eye: No discharge.     Extraocular Movements: Extraocular movements intact.     Conjunctiva/sclera: Conjunctivae normal.     Pupils: Pupils are equal, round, and reactive to light.  Cardiovascular:     Rate and Rhythm: Normal rate. Rhythm irregular.     Pulses: Normal pulses.     Heart sounds: Normal heart sounds. No murmur heard.    No friction rub. No gallop.  Pulmonary:     Effort: Pulmonary effort is normal. No respiratory distress.     Breath sounds: No stridor. No wheezing, rhonchi or rales.  Chest:     Chest wall: No tenderness.  Abdominal:     General: Abdomen is flat. There is no distension.     Palpations: Abdomen is soft.     Tenderness: There is no abdominal tenderness. There is no right CVA tenderness, left CVA tenderness, guarding or rebound.  Musculoskeletal:        General: No swelling, deformity or signs of injury.     Cervical back: Normal range of motion. No rigidity.     Right lower leg: No edema.     Left lower leg: No edema.  Skin:    General: Skin is warm and dry.     Findings: No bruising, erythema or lesion.  Neurological:     General: No focal deficit present.     Mental Status: She is alert and oriented to person, place, and time. Mental status is at baseline.     Cranial Nerves: No cranial nerve deficit.     Sensory: No sensory deficit.     Motor: No  weakness.     Coordination: Coordination normal.     Comments: No facial asymmetry, no ataxia, no apraxia, no aphasia, no arm drift, normal coordination with finger-to-nose, normal sensation to both upper and lower extremities bilaterally, normal grip strength bilaterally, normal strength to both flexion and extension to both upper lower extremities 5+ bilaterally, no visual field deficits, no nystagmus.   Psychiatric:        Mood and Affect: Mood normal.     (all labs ordered are listed, but only abnormal results are displayed) Labs Reviewed  CBC WITH DIFFERENTIAL/PLATELET - Abnormal; Notable for the following components:      Result  Value   RBC 5.13 (*)    Hemoglobin 16.1 (*)    HCT 48.7 (*)    All other components within normal limits  BASIC METABOLIC PANEL WITH GFR - Abnormal; Notable for the following components:   Glucose, Bld 105 (*)    Calcium 10.4 (*)    All other components within normal limits  TROPONIN T, HIGH SENSITIVITY    EKG: EKG Interpretation Date/Time:  Wednesday March 15 2024 13:30:43 EDT Ventricular Rate:  50 PR Interval:  165 QRS Duration:  84 QT Interval:  429 QTC Calculation: 392 R Axis:   -24  Text Interpretation: Sinus bradycardia Atrial premature complex Borderline left axis deviation Low voltage, precordial leads No significant change since last tracing Confirmed by Dean Clarity [phone]) on 03/15/2024 3:07:47 PM  Radiology: [facility] Chest 2 View Result Date: 03/15/2024 CLINICAL DATA:  Chest pressure and dyspnea EXAM: CHEST - 2 VIEW COMPARISON:  03/07/2006 FINDINGS: Shallow inspiration. Heart size and pulmonary vascularity are normal for technique. Lungs are clear. No pleural effusion or pneumothorax. Mediastinal contours appear intact. Degenerative changes in the spine. IMPRESSION: No active cardiopulmonary disease. Electronically Signed   By: Elsie Gravely M.D.   On: 03/15/2024 15:03    Procedures   Medications Ordered in the ED  LORazepam   (ATIVAN ) tablet 0.5 mg (0.5 mg Oral Given 03/15/24 1323)    Medical Decision Making Amount and/or Complexity of Data Reviewed Labs: ordered. Radiology: ordered. ECG/medicine tests: ordered.  Risk Prescription drug management.   This patient is a 68 year old female who presents to the ED for concern of multiple complaints bleeding lightheadedness bilateral arm chest pressure has been intermittent starting yesterday noting that she has been tapering off her anxiety medications without relief.  Noting increases in symptoms as well as increase in stress recently.  On physical exam, patient is in no acute distress, afebrile, alert and orient x 4, speaking in full sentences, nontachypneic, nontachycardic.  LCTAB.  Noted to have irregularly irregular rhythm.  No abdominal tenderness palpation, no CVA tenderness, no lower leg swelling.  Normal neuroexam.  Normal HEENT exam.  Unremarkable exam otherwise.  The patient's irregular irregular rhythm, will obtain baseline atypical chest pain labs.  Suspecting likely anxiety as cause with low suspicion for neurological etiologies with normal neuroexam and symptoms indicative of increased anxiety.  Provided Ativan  per patient's home medications.  On reevaluation, patient symptoms had abated and she is ready go home at this time.  Workup was reassuring with no acute findings.  Will have her continue follow-up with PCP and psychiatry.  Patient vital signs have remained stable throughout the course of patient's time in the ED. Low suspicion for any other emergent pathology at this time. I believe this patient is safe to be discharged. Provided strict return to ER precautions. Patient expressed agreement and understanding of plan. All questions were answered.   Differential diagnoses prior to evaluation: The emergent differential diagnosis includes, but is not limited to,  ACS, pericarditis, myocarditis, aortic dissection, PE, pneumothorax, esophageal rupture,  pneumonia, reflux/PUD, biliary disease, pancreatitis, costochondritis, anxiety,BPPV, vestibular migraine, head trauma, AVM, intracranial tumor, multiple sclerosis, drug-related, CVA, orthostatic hypotension, sepsis, hypoglycemia, electrolyte disturbance, anemia . This is not an exhaustive differential.   Past Medical History / Co-morbidities / Social History: Depression, anxiety, dermatitis, GERD, HTN, hepatitis B  Additional history: Chart reviewed. Pertinent results include:    Last seen for left leg pain by orthopedics.  Started on Celebrex .  Lab Tests/Imaging studies: I personally interpreted labs/imaging and the pertinent  results include:   CBC notes a mild increase in your hemoglobin, RBC likely secondary to dehydration with her noting that she has not had much to drink today. BMP notes a mild hypercalcemia 10.4 but otherwise markable Troponin unremarkable Chest x-ray unremarkable  I agree with the radiologist interpretation.  Cardiac monitoring: EKG obtained and interpreted by myself and attending physician which shows: Sinus bradycardia with PACs  EKG Interpretation Date/Time:  Wednesday March 15 2024 13:30:43 EDT Ventricular Rate:  50 PR Interval:  165 QRS Duration:  84 QT Interval:  429 QTC Calculation: 392 R Axis:   -24  Text Interpretation: Sinus bradycardia Atrial premature complex Borderline left axis deviation Low voltage, precordial leads No significant change since last tracing Confirmed by Dean Clarity [phone]) on 03/15/2024 3:07:47 PM          Medications: I ordered medication including Ativan .  I have reviewed the patients home medicines and have made adjustments as needed.  Critical Interventions: None  Social Determinants of Health: Has good follow-up with PCP and psychiatry  Disposition: After consideration of the diagnostic results and the patients response to treatment, I feel that the patient would benefit from discharge and treatment as  above.   emergency department workup does not suggest an emergent condition requiring admission or immediate intervention beyond what has been performed at this time. The plan is: Symptomatic management at home, continue taking anxiety medications, return to the ED for new or worsening symptoms, follow-up PCP and psychiatry. The patient is safe for discharge and has been instructed to return immediately for worsening symptoms, change in symptoms or any other concerns.   Final diagnoses:  Anxiety    ED Discharge Orders     None          Kimberly Conway 03/15/24 1519    Dean Clarity, MD 03/15/24 [phone]

## 2024-03-15 NOTE — ED Triage Notes (Signed)
 Pt reports with increased anxiety since yesterday. Pt tearful in triage. Pt reports numbness in her lips, arms, and fingers.

## 2024-03-17 ENCOUNTER — Telehealth (INDEPENDENT_AMBULATORY_CARE_PROVIDER_SITE_OTHER): Admitting: Adult Health

## 2024-03-17 ENCOUNTER — Encounter: Payer: Self-pay | Admitting: Adult Health

## 2024-03-17 DIAGNOSIS — F411 Generalized anxiety disorder: Secondary | ICD-10-CM

## 2024-03-17 DIAGNOSIS — F331 Major depressive disorder, recurrent, moderate: Secondary | ICD-10-CM

## 2024-03-17 MED ORDER — LORAZEPAM 0.5 MG PO TABS: 0.5000 mg | ORAL_TABLET | Freq: Every day | ORAL | 2 refills | Status: AC | PRN

## 2024-03-17 MED ORDER — DESVENLAFAXINE SUCCINATE ER 50 MG PO TB24: 50.0000 mg | ORAL_TABLET | Freq: Every day | ORAL | 2 refills | Status: DC

## 2024-03-17 NOTE — Progress Notes (Signed)
 Kimberly Conway [id number] May 17, 1956 68 y.o.  Virtual Visit via Video Note  I connected with pt @ on 03/17/24 at  2:30 PM EDT by a video enabled telemedicine application and verified that I am speaking with the correct person using two identifiers.   I discussed the limitations of evaluation and management by telemedicine and the availability of in person appointments. The patient expressed understanding and agreed to proceed.  I discussed the assessment and treatment plan with the patient. The patient was provided an opportunity to ask questions and all were answered. The patient agreed with the plan and demonstrated an understanding of the instructions.   The patient was advised to call back or seek an in-person evaluation if the symptoms worsen or if the condition fails to improve as anticipated.  I provided 20 minutes of non-face-to-face time during this encounter.  The patient was located at home.  The provider was located at [facility].   Angeline LOISE Sayers, NP   Subjective:   Patient ID:  Kimberly Conway is a 68 y.o. (DOB February 08, 1956) female.  Chief Complaint: No chief complaint on file.   HPI Kimberly Conway presents for follow-up of MDD and GAD.  Describes mood today as ok. Pleasant. Reports tearfulness. Mood symptoms - reports anxiety, depression and irritability. Reports stable interest and motivation. Reports panic attacks - situational. Reports worry, rumination and over thinking. Reports being unemployed, but looking for a job. Reports her family is providing financial support. Reports mood is lower. Stating I haven't been feeling good. Plans to start back on the Pristiq  at 50mg  daily. Taking medications as prescribed. Energy levels stable. Active, does not have a regular exercise routine.  Enjoys some usual interests and activities. Single. Lives alone with 3 cats. No children. Has a sister local. Other family lives in [place], [place] .  Appetite adequate. Weight loss -  152  pounds, 61. Reports sleeping better some nights than others. Averages 6 hours. Denies daytime napping. Reports focus and concentration stable. Completing tasks. Managing aspects of household. Currently unemployed - looking for work. Denies SI or HI.  Denies AH or VH.  Denies self harm. Denies substance use.  Previous medication trials: Ativan , Pristiq   Review of Systems:  Review of Systems  Musculoskeletal:  Negative for gait problem.  Neurological:  Negative for tremors.  Psychiatric/Behavioral:         Please refer to HPI    Medications: I have reviewed the patient's current medications.  Current Outpatient Medications  Medication Sig Dispense Refill   aspirin  EC 81 MG tablet Take 81 mg by mouth in the morning. Swallow whole.     BOOSTRIX 5-2.5-18.5 LF-MCG/0.5 injection      Calcium Citrate (CITRACAL PO) Take 3 tablets by mouth daily after lunch.     CAPVAXIVE 0.5 ML injection      celecoxib  (CELEBREX ) 200 MG capsule Take 1 capsule (200 mg total) by mouth 2 (two) times daily. 60 capsule 0   desvenlafaxine  (PRISTIQ ) 25 MG 24 hr tablet Take 1 tablet (25 mg total) by mouth daily. 90 tablet 0   fluconazole  (DIFLUCAN ) 150 MG tablet Take 1 tablet by mouth once a week.     fluticasone  (FLONASE ) 50 MCG/ACT nasal spray Place 2 sprays into both nostrils daily. 48 g 2   levocetirizine (XYZAL ) 5 MG tablet Take 1 tablet (5 mg total) by mouth every evening. 90 tablet 1   LORazepam  (ATIVAN ) 0.5 MG tablet Take 1 tablet (0.5 mg total) by mouth  daily as needed. 30 tablet 1   meloxicam  (MOBIC ) 7.5 MG tablet Take 1 tablet (7.5 mg total) by mouth daily. 30 tablet 0   Misc Natural Products (GLUCOSAMINE CHONDROITIN TRIPLE) TABS Take 1 tablet by mouth daily.     Multiple Vitamin (MULTIVITAMIN) tablet Take 1 tablet by mouth daily after lunch.     Omega-3 Fatty Acids (OMEGA III EPA+DHA) 1000 MG CAPS Take 1 tablet by mouth daily.     omeprazole  (PRILOSEC) 20 MG capsule TAKE 1 CAPSULE TWICE DAILY  BEFORE MEALS 180 capsule 1   Polyethyl Glycol-Propyl Glycol (LUBRICANT EYE DROPS) 0.4-0.3 % SOLN Place 1-2 drops into both eyes 3 (three) times daily as needed (dry/irritated eyes).     Probiotic Product (PROBIOTIC PO) Take 1 capsule by mouth in the morning.     sodium chloride  (OCEAN) 0.65 % SOLN nasal spray Place 1 spray into both nostrils 2 (two) times daily.     Turmeric 500 MG CAPS Take 500 mg by mouth daily after lunch.     No current facility-administered medications for this visit.    Medication Side Effects: None  Allergies: No Known Allergies  Past Medical History:  Diagnosis Date   Anginal pain    Depression    Family history of adverse reaction to anesthesia    sister gets nauseous after anesthesia   GAD (generalized anxiety disorder)    GERD (gastroesophageal reflux disease)    Hepatitis B 1965   HTN (hypertension)    Multiple thyroid  nodules    Other atopic dermatitis 08/17/2020   Seasonal allergies    Urticaria    Vertigo     Family History  Problem Relation Age of Onset   Diabetes Mother    Breast cancer Mother    CAD Father    Hypertension Sister    Breast cancer Sister    Migraines Sister    Migraines Sister    Diabetes Brother    Colon cancer Neg Hx    Esophageal cancer Neg Hx    Pancreatic cancer Neg Hx    Stomach cancer Neg Hx     Social History   Socioeconomic History   Marital status: Single    Spouse name: Not on file   Number of children: Not on file   Years of education: Not on file   Highest education level: Associate degree: academic program  Occupational History   Not on file  Tobacco Use   Smoking status: Former    Current packs/day: 0.00    Types: Cigarettes    Quit date: 06/21/2014    Years since quitting: 9.7   Smokeless tobacco: Never  Vaping Use   Vaping status: Never Used  Substance and Sexual Activity   Alcohol use: Not Currently    Comment: Quit in 2016   Drug use: Never   Sexual activity: Not Currently     Birth control/protection: Post-menopausal  Other Topics Concern   Not on file  Social History Narrative   Right handed   Social Drivers of Health   Financial Resource Strain: Low Risk  (01/25/2024)   Overall Financial Resource Strain (CARDIA)    Difficulty of Paying Living Expenses: Not hard at all  Food Insecurity: No Food Insecurity (01/25/2024)   Hunger Vital Sign    Worried About Running Out of Food in the Last Year: Never true    Ran Out of Food in the Last Year: Never true  Transportation Needs: No Transportation Needs (01/25/2024)   PRAPARE - Transportation  Lack of Transportation (Medical): No    Lack of Transportation (Non-Medical): No  Physical Activity: Sufficiently Active (01/25/2024)   Exercise Vital Sign    Days of Exercise per Week: 5 days    Minutes of Exercise per Session: 30 min  Stress: Stress Concern Present (01/25/2024)   [employer] of Occupational Health - Occupational Stress Questionnaire    Feeling of Stress: To some extent  Social Connections: Moderately Isolated (01/25/2024)   Social Connection and Isolation Panel    Frequency of Communication with Friends and Family: More than three times a week    Frequency of Social Gatherings with Friends and Family: Three times a week    Attends Religious Services: More than 4 times per year    Active Member of Clubs or Organizations: No    Attends Engineer, structural: Not on file    Marital Status: Divorced  Intimate Partner Violence: Not on file    Past Medical History, Surgical history, Social history, and Family history were reviewed and updated as appropriate.   Please see review of systems for further details on the patient's review from today.   Objective:   Physical Exam:  There were no vitals taken for this visit.  Physical Exam Constitutional:      General: She is not in acute distress. Musculoskeletal:        General: No deformity.  Neurological:     Mental Status: She is alert  and oriented to person, place, and time.     Coordination: Coordination normal.  Psychiatric:        Attention and Perception: Attention and perception normal. She does not perceive auditory or visual hallucinations.        Mood and Affect: Mood normal. Mood is not anxious or depressed. Affect is not labile, blunt, angry or inappropriate.        Speech: Speech normal.        Behavior: Behavior normal.        Thought Content: Thought content normal. Thought content is not paranoid or delusional. Thought content does not include homicidal or suicidal ideation. Thought content does not include homicidal or suicidal plan.        Cognition and Memory: Cognition and memory normal.        Judgment: Judgment normal.     Comments: Insight intact     Lab Review:     Component Value Date/Time   NA 140 03/15/2024 1343   K 4.3 03/15/2024 1343   CL 103 03/15/2024 1343   CO2 25 03/15/2024 1343   GLUCOSE 105 (H) 03/15/2024 1343   BUN 15 03/15/2024 1343   CREATININE 0.82 03/15/2024 1343   CALCIUM 10.4 (H) 03/15/2024 1343   PROT 7.2 04/08/2023 0759   PROT 7.5 05/01/2020 0000   ALBUMIN  4.2 04/08/2023 0759   AST 22 04/08/2023 0759   ALT 24 04/08/2023 0759   ALKPHOS 121 (H) 04/08/2023 0759   BILITOT 0.5 04/08/2023 0759   GFRNONAA >60 03/15/2024 1343       Component Value Date/Time   WBC 5.7 03/15/2024 1343   RBC 5.13 (H) 03/15/2024 1343   HGB 16.1 (H) 03/15/2024 1343   HCT 48.7 (H) 03/15/2024 1343   PLT 311 03/15/2024 1343   MCV 94.9 03/15/2024 1343   MCH 31.4 03/15/2024 1343   MCHC 33.1 03/15/2024 1343   RDW 12.8 03/15/2024 1343   LYMPHSABS 1.5 03/15/2024 1343   MONOABS 0.3 03/15/2024 1343   EOSABS 0.1 03/15/2024 1343  BASOSABS 0.1 03/15/2024 1343    No results found for: POCLITH, LITHIUM   No results found for: PHENYTOIN, PHENOBARB, VALPROATE, CBMZ   .res Assessment: Plan:    Plan:  PDMP reviewed  Increase Pristiq  25 mg daily to 50mg  daily - mood decline with  taper.  Ativan  0.5mg  daily as needed - trying not to take every day.  RTC 4 weeks  Patient advised to contact office with any questions, adverse effects, or acute worsening in signs and symptoms.  20 minutes spent dedicated to the care of this patient on the date of this encounter to include pre-visit review of records, ordering of medication, post visit documentation, and face-to-face time with the patient discussing depression and anxiety. Discussed continuing current medication regimen.   Discussed potential benefits, risk, and side effects of benzodiazepines to include potential risk of tolerance and dependence, as well as possible drowsiness. Advised patient not to drive if experiencing drowsiness and to take lowest possible effective dose to minimize risk of dependence and tolerance.  There are no diagnoses linked to this encounter.   Please see After Visit Summary for patient specific instructions.  No future appointments.   No orders of the defined types were placed in this encounter.     -------------------------------

## 2024-03-23 ENCOUNTER — Ambulatory Visit: Admitting: Podiatry

## 2024-03-23 ENCOUNTER — Encounter: Payer: Self-pay | Admitting: Podiatry

## 2024-03-23 DIAGNOSIS — G629 Polyneuropathy, unspecified: Secondary | ICD-10-CM | POA: Diagnosis not present

## 2024-03-23 MED ORDER — GABAPENTIN 300 MG PO CAPS: 300.0000 mg | ORAL_CAPSULE | Freq: Every day | ORAL | 1 refills | Status: AC

## 2024-03-23 MED ORDER — LIDOCAINE 5 % EX OINT
1.0000 | TOPICAL_OINTMENT | CUTANEOUS | 2 refills | Status: AC | PRN
Start: 2024-03-23 — End: ?

## 2024-03-26 NOTE — Progress Notes (Signed)
  Subjective:  Patient ID: Kimberly Conway, female    DOB: 1956-01-23,  MRN: [id number]  Chief Complaint  Patient presents with   Follow-up    Patient stated that she had an ingrown toe nail removed on her left foot in August and ever since then she has been having aching pain and sharp pains that shoot up her left leg. Patient stated that it happens at night also laying down.    67 y.o. female presents with the above complaint. History confirmed with patient.  She says the pains are random and not necessarily related to shoe gear or activity.  Tissues all the way to up the legs and on the lateral side of the foot up into her hip  Objective:  Physical Exam: warm, good capillary refill, no trophic changes or ulcerative lesions, normal DP and PT pulses, normal sensory exam, and ingrown nail removal site have healed well without signs of infection.  Unable to reproduce pain.  Assessment:   1. Peripheral neuritis      Plan:  Patient was evaluated and treated and all questions answered.  She has neuritic type symptoms which could be multiple etiologies including lumbosacral radiculopathy.  I do not think this is related directly to her procedure.  I prescribed her gabapentin lidocaine  and discussed this likely will take some time to resolve if it is not further improve neurologic evaluation from neurology may be beneficial.  Follow-up as needed.  No follow-ups on file.

## 2024-03-28 ENCOUNTER — Encounter: Payer: Self-pay | Admitting: Podiatry

## 2024-03-28 ENCOUNTER — Encounter: Payer: Self-pay | Admitting: Internal Medicine

## 2024-03-29 ENCOUNTER — Inpatient Hospital Stay: Admitting: Internal Medicine

## 2024-03-31 ENCOUNTER — Telehealth: Admitting: Adult Health

## 2024-04-10 ENCOUNTER — Telehealth (INDEPENDENT_AMBULATORY_CARE_PROVIDER_SITE_OTHER): Admitting: Adult Health

## 2024-04-10 ENCOUNTER — Encounter: Payer: Self-pay | Admitting: Adult Health

## 2024-04-10 DIAGNOSIS — F411 Generalized anxiety disorder: Secondary | ICD-10-CM

## 2024-04-10 DIAGNOSIS — F331 Major depressive disorder, recurrent, moderate: Secondary | ICD-10-CM | POA: Diagnosis not present

## 2024-04-10 MED ORDER — DESVENLAFAXINE SUCCINATE ER 50 MG PO TB24: 50.0000 mg | ORAL_TABLET | Freq: Every day | ORAL | 0 refills | Status: DC

## 2024-04-10 NOTE — Progress Notes (Signed)
 Kimberly Conway [id number] 11-24-1955 68 y.o.  Virtual Visit via Video Note  I connected with pt @ on 04/10/24 at 12:00 PM EST by a video enabled telemedicine application and verified that I am speaking with the correct person using two identifiers.   I discussed the limitations of evaluation and management by telemedicine and the availability of in person appointments. The patient expressed understanding and agreed to proceed.  I discussed the assessment and treatment plan with the patient. The patient was provided an opportunity to ask questions and all were answered. The patient agreed with the plan and demonstrated an understanding of the instructions.   The patient was advised to call back or seek an in-person evaluation if the symptoms worsen or if the condition fails to improve as anticipated.  I provided 15 minutes of non-face-to-face time during this encounter.  The patient was located at home.  The provider was located at [facility].   Angeline LOISE Sayers, NP   Subjective:   Patient ID:  Kimberly Conway is a 68 y.o. (DOB 1956-01-18) female.  Chief Complaint: No chief complaint on file.   HPI Ratasha A Hietala presents for follow-up of MDD and GAD.  Describes mood today as ok. Pleasant. Reports tearfulness. Mood symptoms - denies anxiety, depression and irritability - feeling better. Reports stable interest and motivation. Denies panic attacks. Denies worry, rumination and over thinking. Starting a new job at [employer] this week. Reports her family has been providing financial support. Reports mood has improved. Stating I feel like I'm doing better. Feels like the increase in Pristiq  has been helpful. Taking medications as prescribed. Energy levels stable. Active, does not have a regular exercise routine.  Enjoys some usual interests and activities. Single. Lives alone with 3 cats. No children. Has a sister local. Other family lives in [place], [place] .  Appetite adequate. Weight  loss - 152  pounds, 61. Reports sleeping better some nights than others. Averages 6 or more hours. Denies daytime napping. Reports focus and concentration stable. Completing tasks. Managing aspects of household. Denies SI or HI.  Denies AH or VH.  Denies self harm. Denies substance use.  Previous medication trials: Ativan , Pristiq    Review of Systems:  Review of Systems  Musculoskeletal:  Negative for gait problem.  Neurological:  Negative for tremors.  Psychiatric/Behavioral:         Please refer to HPI    Medications: I have reviewed the patient's current medications.  Current Outpatient Medications  Medication Sig Dispense Refill   aspirin  EC 81 MG tablet Take 81 mg by mouth in the morning. Swallow whole.     BOOSTRIX 5-2.5-18.5 LF-MCG/0.5 injection      Calcium Citrate (CITRACAL PO) Take 3 tablets by mouth daily after lunch.     CAPVAXIVE 0.5 ML injection      celecoxib  (CELEBREX ) 200 MG capsule Take 1 capsule (200 mg total) by mouth 2 (two) times daily. 60 capsule 0   desvenlafaxine  (PRISTIQ ) 50 MG 24 hr tablet Take 1 tablet (50 mg total) by mouth daily. 30 tablet 2   fluconazole  (DIFLUCAN ) 150 MG tablet Take 1 tablet by mouth once a week.     fluticasone  (FLONASE ) 50 MCG/ACT nasal spray Place 2 sprays into both nostrils daily. 48 g 2   gabapentin (NEURONTIN) 300 MG capsule Take 1 capsule (300 mg total) by mouth at bedtime. 30 capsule 1   levocetirizine (XYZAL ) 5 MG tablet Take 1 tablet (5 mg total) by mouth every evening.  90 tablet 1   lidocaine  (XYLOCAINE ) 5 % ointment Apply 1 Application topically as needed. 35.44 g 2   LORazepam  (ATIVAN ) 0.5 MG tablet Take 1 tablet (0.5 mg total) by mouth daily as needed. 30 tablet 2   meloxicam  (MOBIC ) 7.5 MG tablet Take 1 tablet (7.5 mg total) by mouth daily. 30 tablet 0   Misc Natural Products (GLUCOSAMINE CHONDROITIN TRIPLE) TABS Take 1 tablet by mouth daily.     Multiple Vitamin (MULTIVITAMIN) tablet Take 1 tablet by mouth daily  after lunch.     Omega-3 Fatty Acids (OMEGA III EPA+DHA) 1000 MG CAPS Take 1 tablet by mouth daily.     omeprazole  (PRILOSEC) 20 MG capsule TAKE 1 CAPSULE TWICE DAILY BEFORE MEALS 180 capsule 1   Polyethyl Glycol-Propyl Glycol (LUBRICANT EYE DROPS) 0.4-0.3 % SOLN Place 1-2 drops into both eyes 3 (three) times daily as needed (dry/irritated eyes).     Probiotic Product (PROBIOTIC PO) Take 1 capsule by mouth in the morning.     sodium chloride  (OCEAN) 0.65 % SOLN nasal spray Place 1 spray into both nostrils 2 (two) times daily.     Turmeric 500 MG CAPS Take 500 mg by mouth daily after lunch.     No current facility-administered medications for this visit.    Medication Side Effects: None  Allergies: No Known Allergies  Past Medical History:  Diagnosis Date   Anginal pain    Depression    Family history of adverse reaction to anesthesia    sister gets nauseous after anesthesia   GAD (generalized anxiety disorder)    GERD (gastroesophageal reflux disease)    Hepatitis B 1965   HTN (hypertension)    Multiple thyroid  nodules    Other atopic dermatitis 08/17/2020   Seasonal allergies    Urticaria    Vertigo     Family History  Problem Relation Age of Onset   Diabetes Mother    Breast cancer Mother    CAD Father    Hypertension Sister    Breast cancer Sister    Migraines Sister    Migraines Sister    Diabetes Brother    Colon cancer Neg Hx    Esophageal cancer Neg Hx    Pancreatic cancer Neg Hx    Stomach cancer Neg Hx     Social History   Socioeconomic History   Marital status: Single    Spouse name: Not on file   Number of children: Not on file   Years of education: Not on file   Highest education level: Associate degree: academic program  Occupational History   Not on file  Tobacco Use   Smoking status: Former    Current packs/day: 0.00    Types: Cigarettes    Quit date: 06/21/2014    Years since quitting: 9.8   Smokeless tobacco: Never  Vaping Use   Vaping  status: Never Used  Substance and Sexual Activity   Alcohol use: Not Currently    Comment: Quit in 2016   Drug use: Never   Sexual activity: Not Currently    Birth control/protection: Post-menopausal  Other Topics Concern   Not on file  Social History Narrative   Right handed   Social Drivers of Health   Financial Resource Strain: Low Risk  (01/25/2024)   Overall Financial Resource Strain (CARDIA)    Difficulty of Paying Living Expenses: Not hard at all  Food Insecurity: No Food Insecurity (01/25/2024)   Hunger Vital Sign    Worried About Running  Out of Food in the Last Year: Never true    Ran Out of Food in the Last Year: Never true  Transportation Needs: No Transportation Needs (01/25/2024)   PRAPARE - Administrator, Civil Service (Medical): No    Lack of Transportation (Non-Medical): No  Physical Activity: Sufficiently Active (01/25/2024)   Exercise Vital Sign    Days of Exercise per Week: 5 days    Minutes of Exercise per Session: 30 min  Stress: Stress Concern Present (01/25/2024)   [employer] of Occupational Health - Occupational Stress Questionnaire    Feeling of Stress: To some extent  Social Connections: Moderately Isolated (01/25/2024)   Social Connection and Isolation Panel    Frequency of Communication with Friends and Family: More than three times a week    Frequency of Social Gatherings with Friends and Family: Three times a week    Attends Religious Services: More than 4 times per year    Active Member of Clubs or Organizations: No    Attends Engineer, Structural: Not on file    Marital Status: Divorced  Intimate Partner Violence: Not on file    Past Medical History, Surgical history, Social history, and Family history were reviewed and updated as appropriate.   Please see review of systems for further details on the patient's review from today.   Objective:   Physical Exam:  There were no vitals taken for this visit.  Physical  Exam Constitutional:      General: She is not in acute distress. Musculoskeletal:        General: No deformity.  Neurological:     Mental Status: She is alert and oriented to person, place, and time.     Coordination: Coordination normal.  Psychiatric:        Attention and Perception: Attention and perception normal. She does not perceive auditory or visual hallucinations.        Mood and Affect: Mood normal. Mood is not anxious or depressed. Affect is not labile, blunt, angry or inappropriate.        Speech: Speech normal.        Behavior: Behavior normal.        Thought Content: Thought content normal. Thought content is not paranoid or delusional. Thought content does not include homicidal or suicidal ideation. Thought content does not include homicidal or suicidal plan.        Cognition and Memory: Cognition and memory normal.        Judgment: Judgment normal.     Comments: Insight intact     Lab Review:     Component Value Date/Time   NA 140 03/15/2024 1343   K 4.3 03/15/2024 1343   CL 103 03/15/2024 1343   CO2 25 03/15/2024 1343   GLUCOSE 105 (H) 03/15/2024 1343   BUN 15 03/15/2024 1343   CREATININE 0.82 03/15/2024 1343   CALCIUM 10.4 (H) 03/15/2024 1343   PROT 7.2 04/08/2023 0759   PROT 7.5 05/01/2020 0000   ALBUMIN  4.2 04/08/2023 0759   AST 22 04/08/2023 0759   ALT 24 04/08/2023 0759   ALKPHOS 121 (H) 04/08/2023 0759   BILITOT 0.5 04/08/2023 0759   GFRNONAA >60 03/15/2024 1343       Component Value Date/Time   WBC 5.7 03/15/2024 1343   RBC 5.13 (H) 03/15/2024 1343   HGB 16.1 (H) 03/15/2024 1343   HCT 48.7 (H) 03/15/2024 1343   PLT 311 03/15/2024 1343   MCV 94.9 03/15/2024 1343  MCH 31.4 03/15/2024 1343   MCHC 33.1 03/15/2024 1343   RDW 12.8 03/15/2024 1343   LYMPHSABS 1.5 03/15/2024 1343   MONOABS 0.3 03/15/2024 1343   EOSABS 0.1 03/15/2024 1343   BASOSABS 0.1 03/15/2024 1343    No results found for: POCLITH, LITHIUM   No results found for:  PHENYTOIN, PHENOBARB, VALPROATE, CBMZ   .res Assessment: Plan:    Plan:  PDMP reviewed  Pristiq  50mg  daily   Ativan  0.5mg  daily as needed - trying not to take every day.  RTC 3 months  Patient advised to contact office with any questions, adverse effects, or acute worsening in signs and symptoms.  20 minutes spent dedicated to the care of this patient on the date of this encounter to include pre-visit review of records, ordering of medication, post visit documentation, and face-to-face time with the patient discussing depression and anxiety. Discussed continuing current medication regimen.   Discussed potential benefits, risk, and side effects of benzodiazepines to include potential risk of tolerance and dependence, as well as possible drowsiness. Advised patient not to drive if experiencing drowsiness and to take lowest possible effective dose to minimize risk of dependence and tolerance. There are no diagnoses linked to this encounter.   Please see After Visit Summary for patient specific instructions.  Future Appointments  Date Time Provider Department Center  04/10/2024 12:00 PM Caidence Kaseman, Angeline Mattocks, NP [facility] None  07/20/2024 11:30 AM Theophilus Andrews, Tully GRADE, MD [facility] [address]    No orders of the defined types were placed in this encounter.     -------------------------------

## 2024-04-28 ENCOUNTER — Ambulatory Visit: Admitting: Podiatry

## 2024-05-23 ENCOUNTER — Ambulatory Visit: Admitting: Internal Medicine

## 2024-05-25 ENCOUNTER — Ambulatory Visit: Admitting: Internal Medicine

## 2024-06-05 ENCOUNTER — Telehealth: Payer: Self-pay

## 2024-06-05 NOTE — Telephone Encounter (Signed)
 Copied from CRM #[id number]. Topic: Clinical - Request for Lab/Test Order >> Jun 05, 2024  8:15 AM Antony RAMAN wrote: Reason for CRM: wants A1c specifically and other labs checked before physical, advised that they will write labs that day

## 2024-06-16 ENCOUNTER — Ambulatory Visit

## 2024-06-20 ENCOUNTER — Other Ambulatory Visit: Payer: Self-pay | Admitting: *Deleted

## 2024-06-20 MED ORDER — FLUTICASONE PROPIONATE 50 MCG/ACT NA SUSP: 2.0000 | Freq: Every day | NASAL | 2 refills | Status: AC

## 2024-06-20 MED ORDER — OMEPRAZOLE 20 MG PO CPDR: 20.0000 mg | DELAYED_RELEASE_CAPSULE | Freq: Two times a day (BID) | ORAL | 0 refills | Status: DC

## 2024-06-27 ENCOUNTER — Other Ambulatory Visit: Payer: Self-pay | Admitting: Internal Medicine

## 2024-07-10 ENCOUNTER — Other Ambulatory Visit: Payer: Self-pay | Admitting: Adult Health

## 2024-07-10 ENCOUNTER — Telehealth: Payer: Self-pay | Admitting: Adult Health

## 2024-07-10 DIAGNOSIS — F411 Generalized anxiety disorder: Secondary | ICD-10-CM

## 2024-07-10 DIAGNOSIS — F331 Major depressive disorder, recurrent, moderate: Secondary | ICD-10-CM

## 2024-07-10 MED ORDER — DESVENLAFAXINE SUCCINATE ER 50 MG PO TB24: 50.0000 mg | ORAL_TABLET | Freq: Every day | ORAL | 0 refills | Status: AC

## 2024-07-10 NOTE — Telephone Encounter (Signed)
 Dayan lvm that she needs a refill on her presti. Pharmacy is [facility]. She is due for an appt tis month

## 2024-07-10 NOTE — Telephone Encounter (Signed)
 Sent!

## 2024-07-20 ENCOUNTER — Encounter: Admitting: Internal Medicine

## 2024-08-30 ENCOUNTER — Ambulatory Visit
